# Patient Record
Sex: Female | Born: 1965 | Race: White | Hispanic: No | Marital: Married | Smoking: Never smoker
Health system: Southern US, Community
[De-identification: ages and names within clinical notes are randomized; demographics above are authoritative.]

## PROBLEM LIST (undated history)

## (undated) DIAGNOSIS — N6019 Diffuse cystic mastopathy of unspecified breast: Secondary | ICD-10-CM

## (undated) DIAGNOSIS — K5090 Crohn's disease, unspecified, without complications: Secondary | ICD-10-CM

## (undated) DIAGNOSIS — Z9889 Other specified postprocedural states: Secondary | ICD-10-CM

## (undated) DIAGNOSIS — Z1211 Encounter for screening for malignant neoplasm of colon: Secondary | ICD-10-CM

## (undated) DIAGNOSIS — L280 Lichen simplex chronicus: Secondary | ICD-10-CM

## (undated) DIAGNOSIS — L430 Hypertrophic lichen planus: Secondary | ICD-10-CM

## (undated) DIAGNOSIS — R112 Nausea with vomiting, unspecified: Secondary | ICD-10-CM

## (undated) DIAGNOSIS — D2360 Other benign neoplasm of skin of unspecified upper limb, including shoulder: Secondary | ICD-10-CM

## (undated) HISTORY — DX: Lichen simplex chronicus: L28.0

## (undated) HISTORY — DX: Other benign neoplasm of skin of unspecified upper limb, including shoulder: D23.60

## (undated) HISTORY — DX: Crohn's disease, unspecified, without complications: K50.90

## (undated) HISTORY — DX: Diffuse cystic mastopathy of unspecified breast: N60.19

## (undated) HISTORY — DX: Hypertrophic lichen planus: L43.0

## (undated) HISTORY — PX: DILATION AND CURETTAGE OF UTERUS: SHX78

## (undated) HISTORY — DX: Encounter for screening for malignant neoplasm of colon: Z12.11

## (undated) HISTORY — PX: TUBAL LIGATION: SHX77

## (undated) HISTORY — PX: OTHER SURGICAL HISTORY: SHX169

---

## 1993-09-07 DIAGNOSIS — T8859XA Other complications of anesthesia, initial encounter: Secondary | ICD-10-CM

## 1993-09-07 HISTORY — DX: Other complications of anesthesia, initial encounter: T88.59XA

## 2000-05-08 DIAGNOSIS — R7309 Other abnormal glucose: Secondary | ICD-10-CM | POA: Insufficient documentation

## 2001-12-06 DIAGNOSIS — K5090 Crohn's disease, unspecified, without complications: Secondary | ICD-10-CM | POA: Insufficient documentation

## 2001-12-06 HISTORY — PX: CHOLECYSTECTOMY: SHX55

## 2003-09-08 DIAGNOSIS — K5090 Crohn's disease, unspecified, without complications: Secondary | ICD-10-CM

## 2003-09-08 HISTORY — DX: Crohn's disease, unspecified, without complications: K50.90

## 2004-02-11 HISTORY — PX: BREAST BIOPSY: SHX20

## 2004-02-11 IMAGING — MG MM BREAST NEEDLE LOCALIZATION*L*
1 series · 8 of 8 positions shown · non-contrast
Comparison: none

REASON FOR EXAM: LT breast mass SURGERY AT 10AM  mammo done at [facility]

[L ML · left · 8 of 8 slices shown]
[im 1/8]
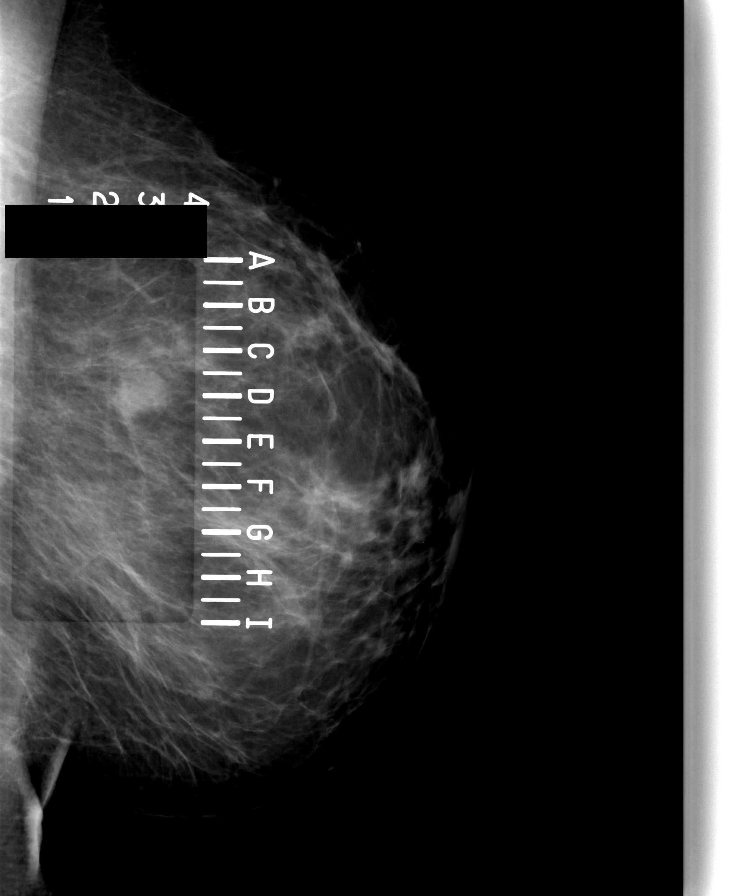
[im 2/8]
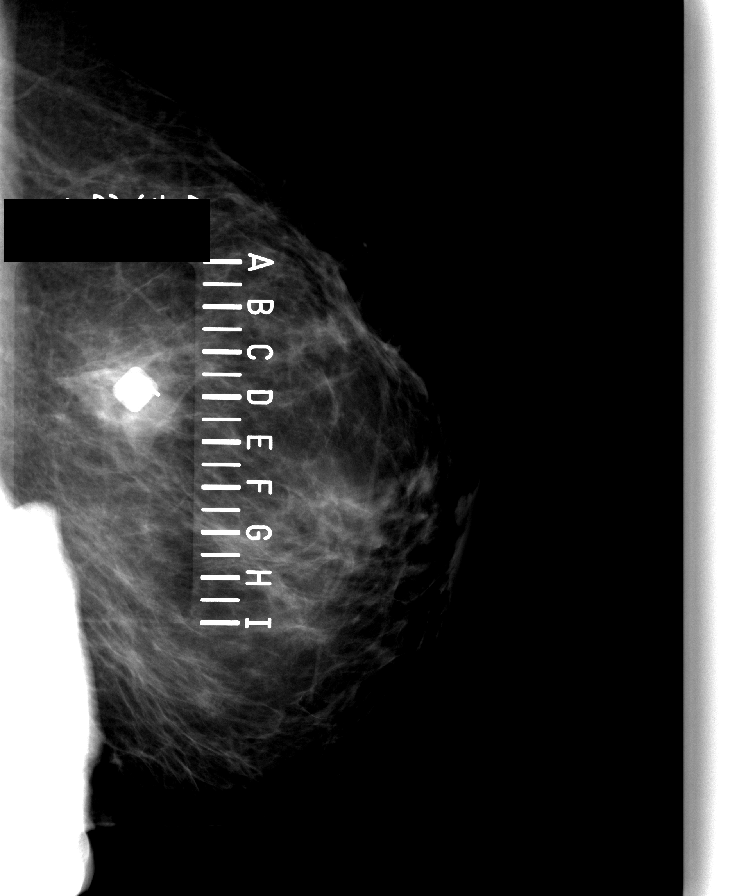
[im 3/8]
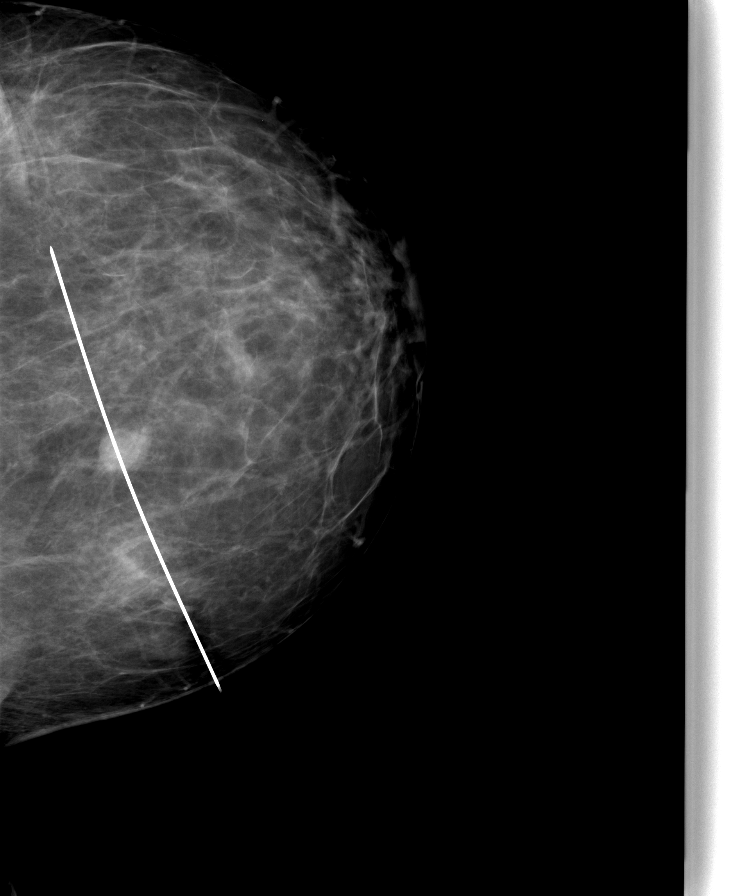
[im 4/8]
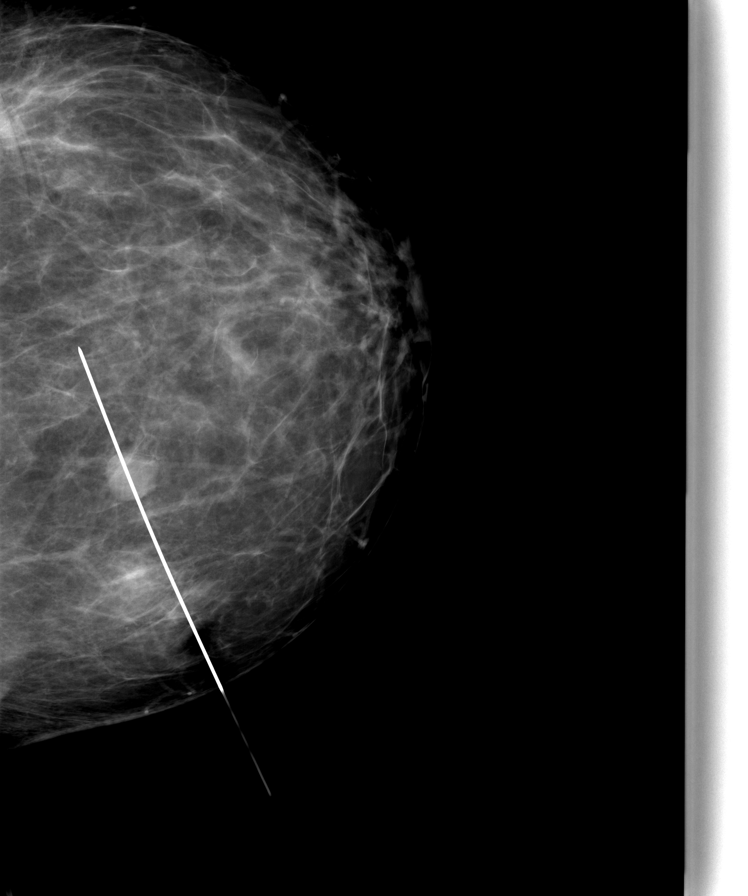
[im 5/8]
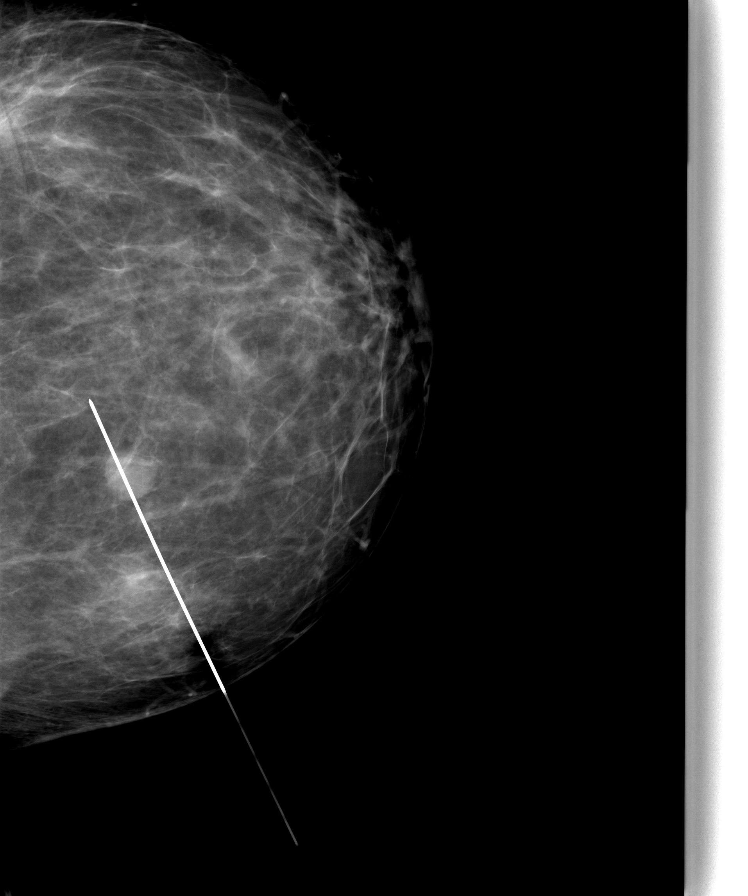
[im 6/8]
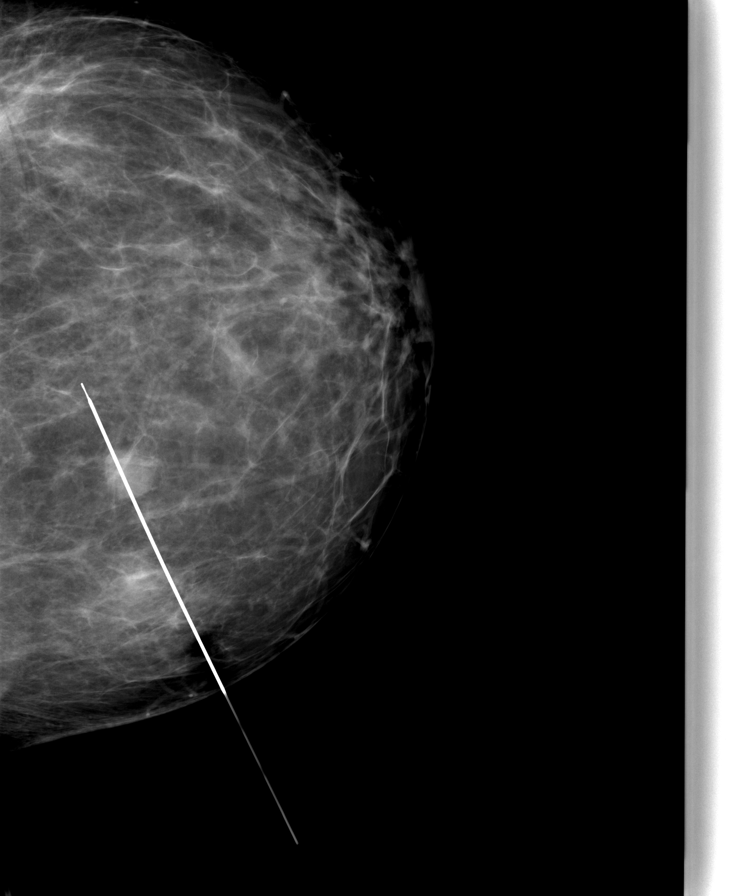
[im 7/8]
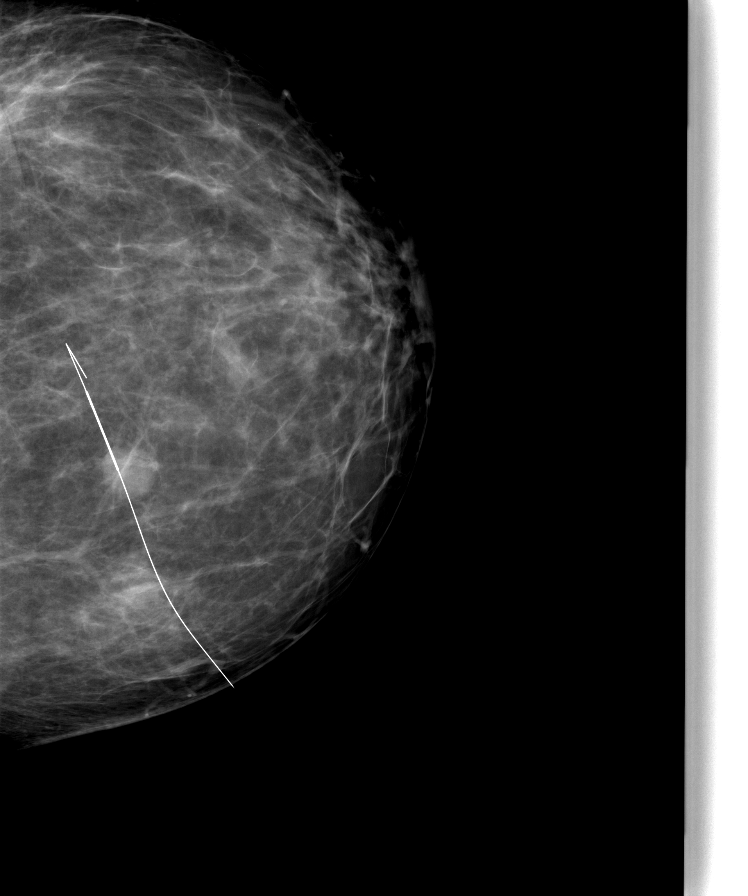
[im 8/8]
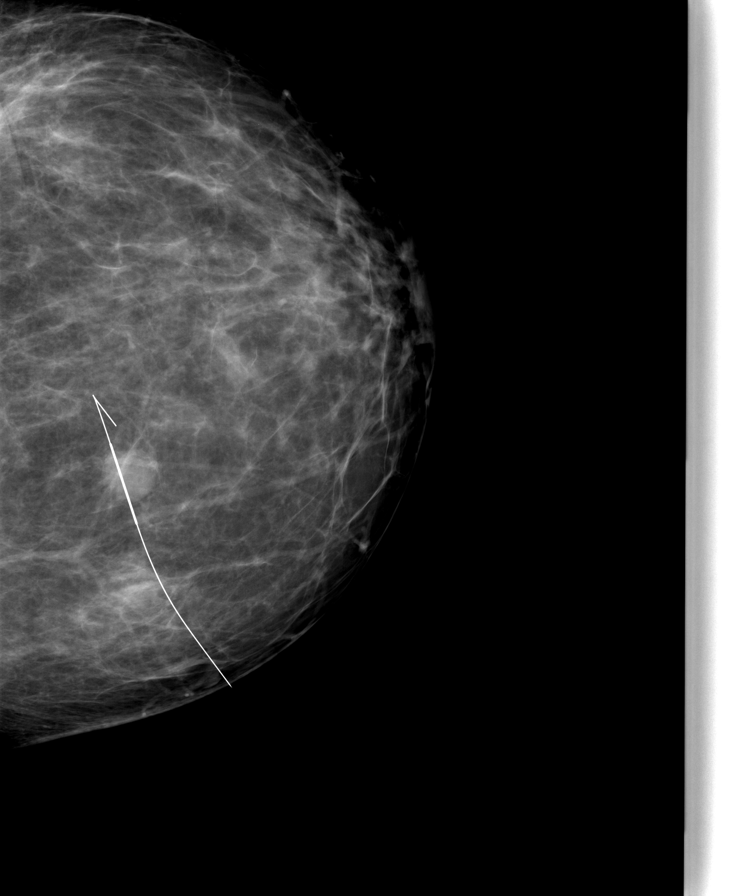

[8 of 8 positions shown; findings below may reference images not displayed]

Procedure: LEFT BREAST NEEDLE LOCALIZATION

 The patient was informed of the risks and benefits of the procedure and
proper informed consent was obtained.

 Utilizing digital mammography, localization of the nodular density within
the LEFT breast was performed. An appropriate approach from a medial to
lateral direction was established. The overlying soft tissues were then
prepped and draped in the usual sterile fashion. Utilizing approximately 3
ccs f 1% Lidocaine, the overlying soft tissues were anesthetized. A 9.0 cm,
Kopans, entry needle was used to cannulate the nodular density. Needle
placement was confirmed utilizing digital mammography. A modified Kopans
wire was placed within the entry needle traversing the nodular density.

 The wire placement was confirmed utilizing digital mammography. The patient
tolerated the procedure without complications and confirmatory digital
radiographs were obtained.
IMPRESSION: LEFT breast needle localization as described above. The patient tolerated
the procedure without complication. The patient was then taken to surgery
for lumpectomy.

## 2004-12-25 ENCOUNTER — Ambulatory Visit: Payer: Self-pay | Admitting: General Surgery

## 2004-12-25 IMAGING — MG MM CAD SCREENING MAMMO
1 series · 4 of 4 positions shown · non-contrast
Comparison: none

REASON FOR EXAM: screening
COMMENTS:

[R CC · right · 4 of 4 slices shown]
[im 1/4]
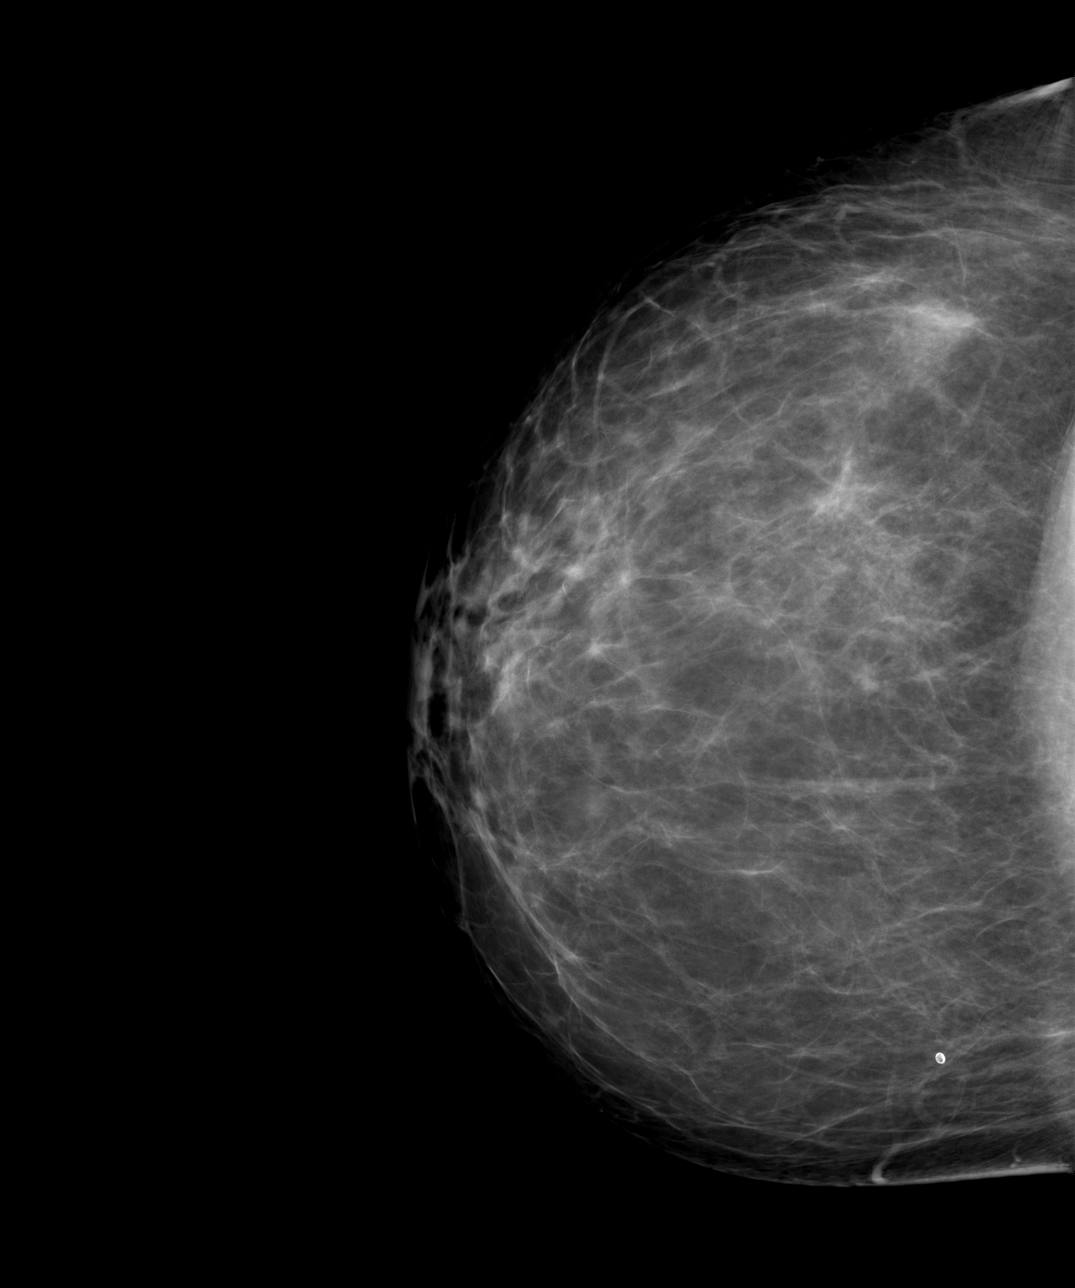
[im 2/4]
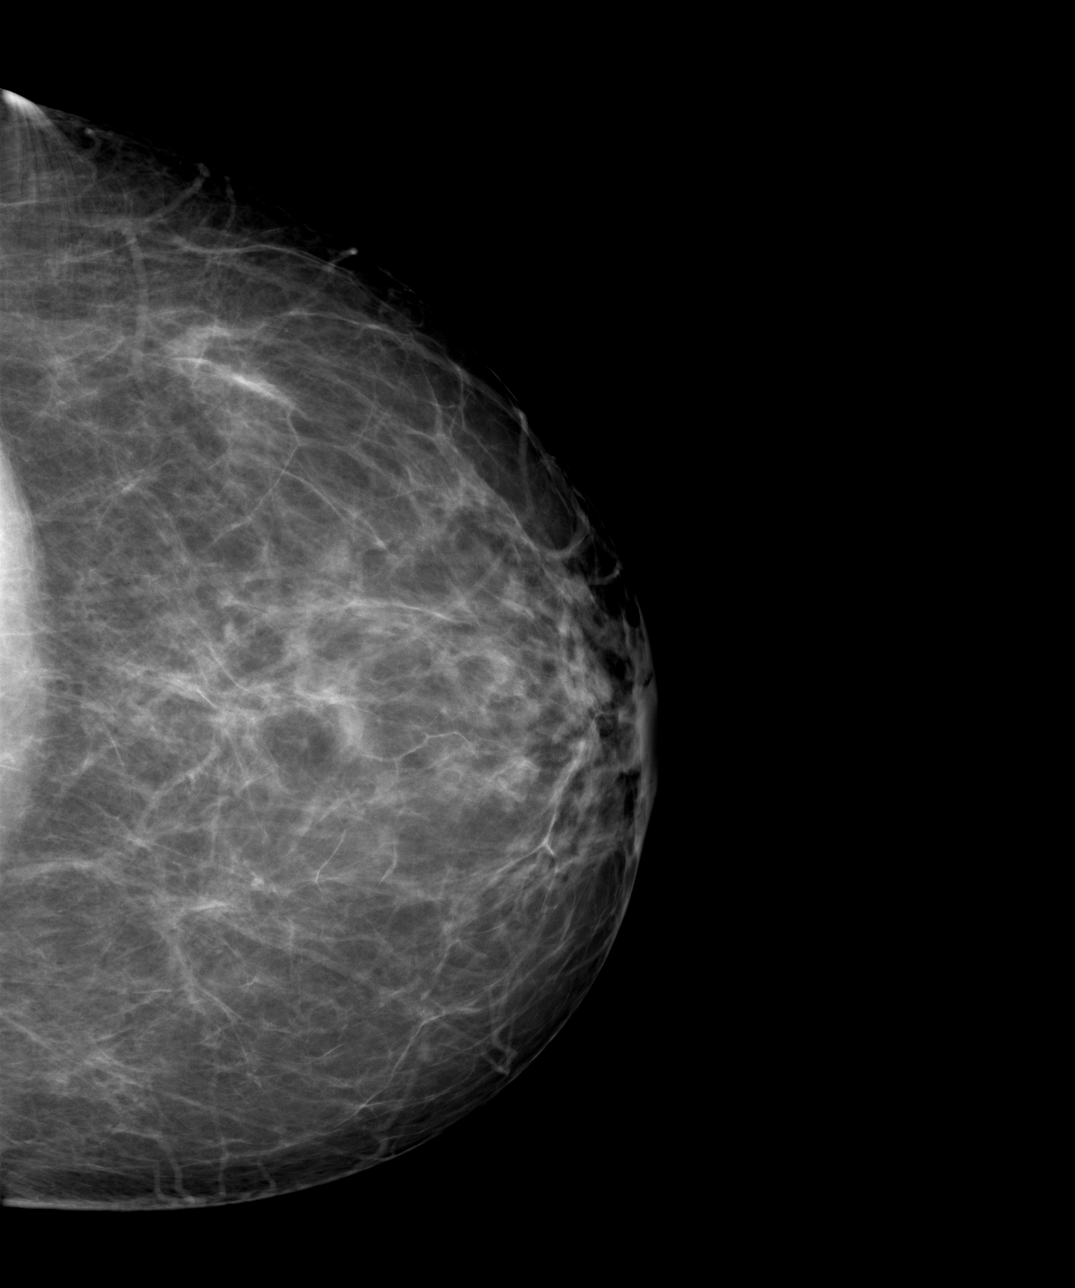
[im 3/4]
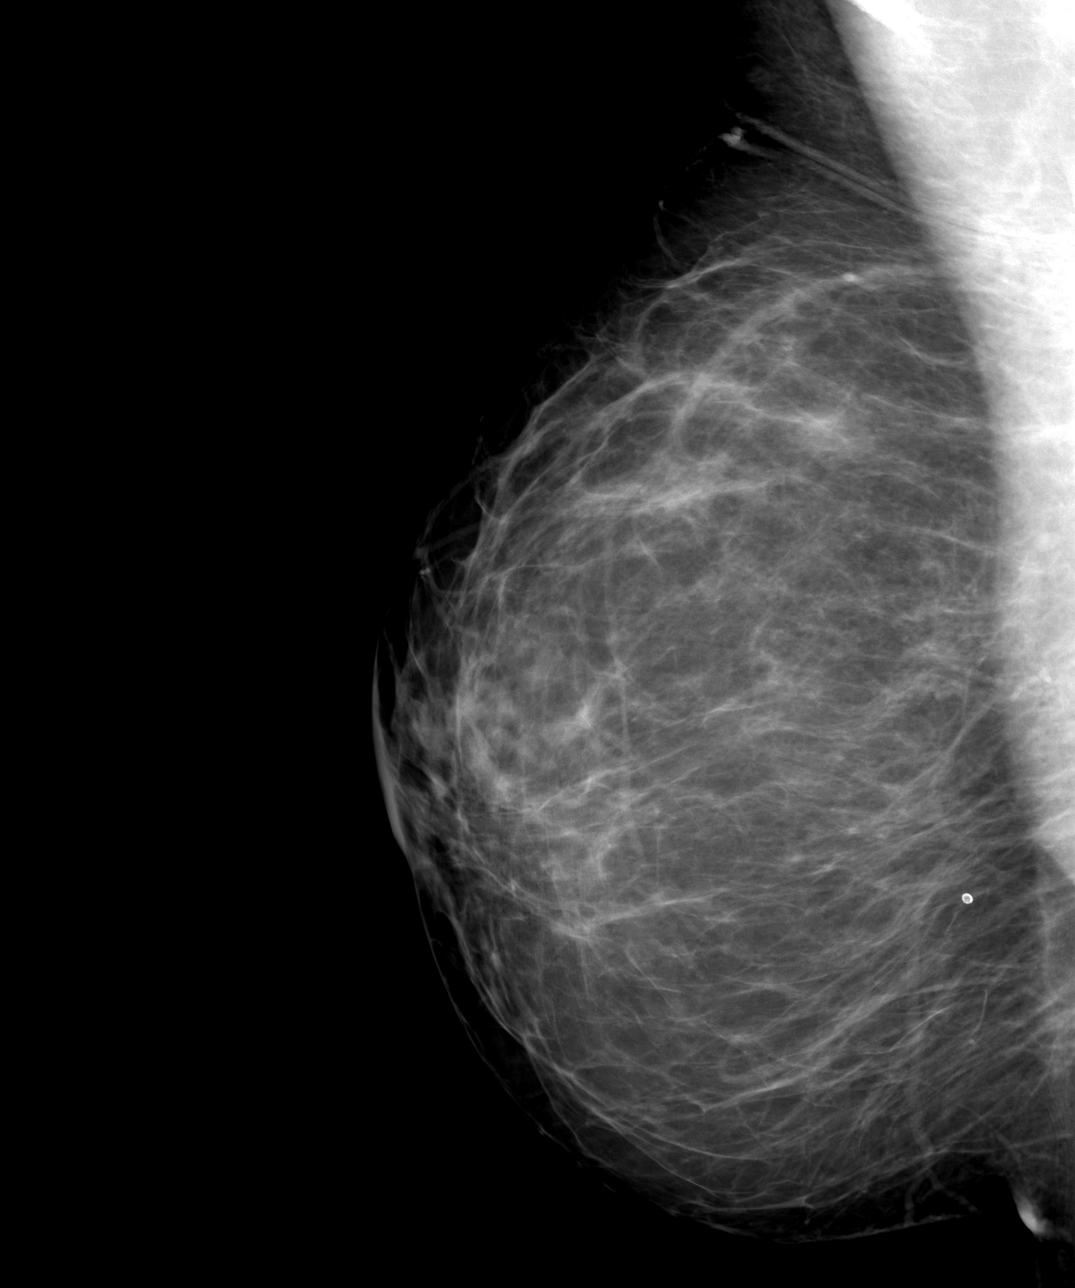
[im 4/4]
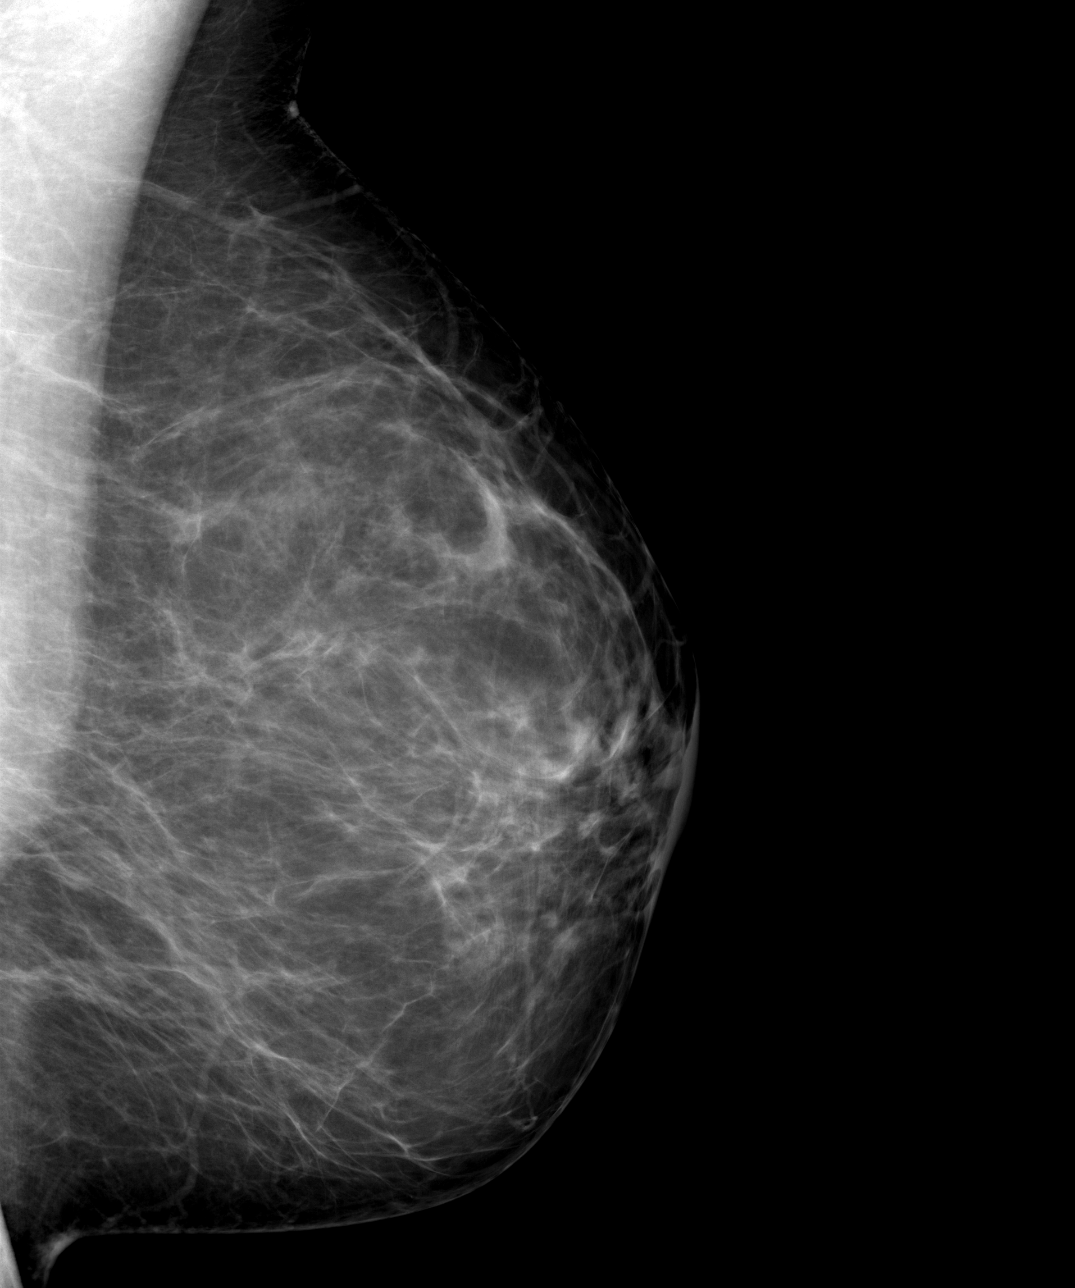

[4 of 4 positions shown; findings below may reference images not displayed]

PROCEDURE:     MAM - MAM DGTL SCREENING MAMMO W/CAD  - December 25, 2004  [DATE]

RESULT:         The breasts demonstrate a heterogenous parenchymal pattern.
 An area of asymmetric density projects within the superolateral portion of
the RIGHT breast.   This is a vague area and likely a benign finding.
Further evaluation with magnification compression imaging is recommended.
No further radiographic evidence to suggest malignancy is appreciated.
IMPRESSION: 1.     Asymmetric density RIGHT breast.  Additional imaging recommended.
2.     BI-RADS:  Category 0- Needs Additional Imaging Evaluation.

A NEGATIVE MAMMOGRAM REPORT DOES NOT PRECLUDE BIOPSY OR OTHER EVALUATION OF
A CLINICALLY PALPABLE OR OTHERWISE SUSPICIOUS MASS OR LESION.   BREAST

## 2005-01-01 ENCOUNTER — Ambulatory Visit: Payer: Self-pay | Admitting: General Surgery

## 2005-01-01 IMAGING — MG MM ADDITIONAL VIEWS AT NO CHARGE
1 series · 4 of 4 positions shown · non-contrast
Comparison: none

REASON FOR EXAM: DENSITY   IF US NEEDED TO BE DONE IN [REDACTED]
COMMENTS:

[R CC · right · 4 of 4 slices shown]
[im 1/4]
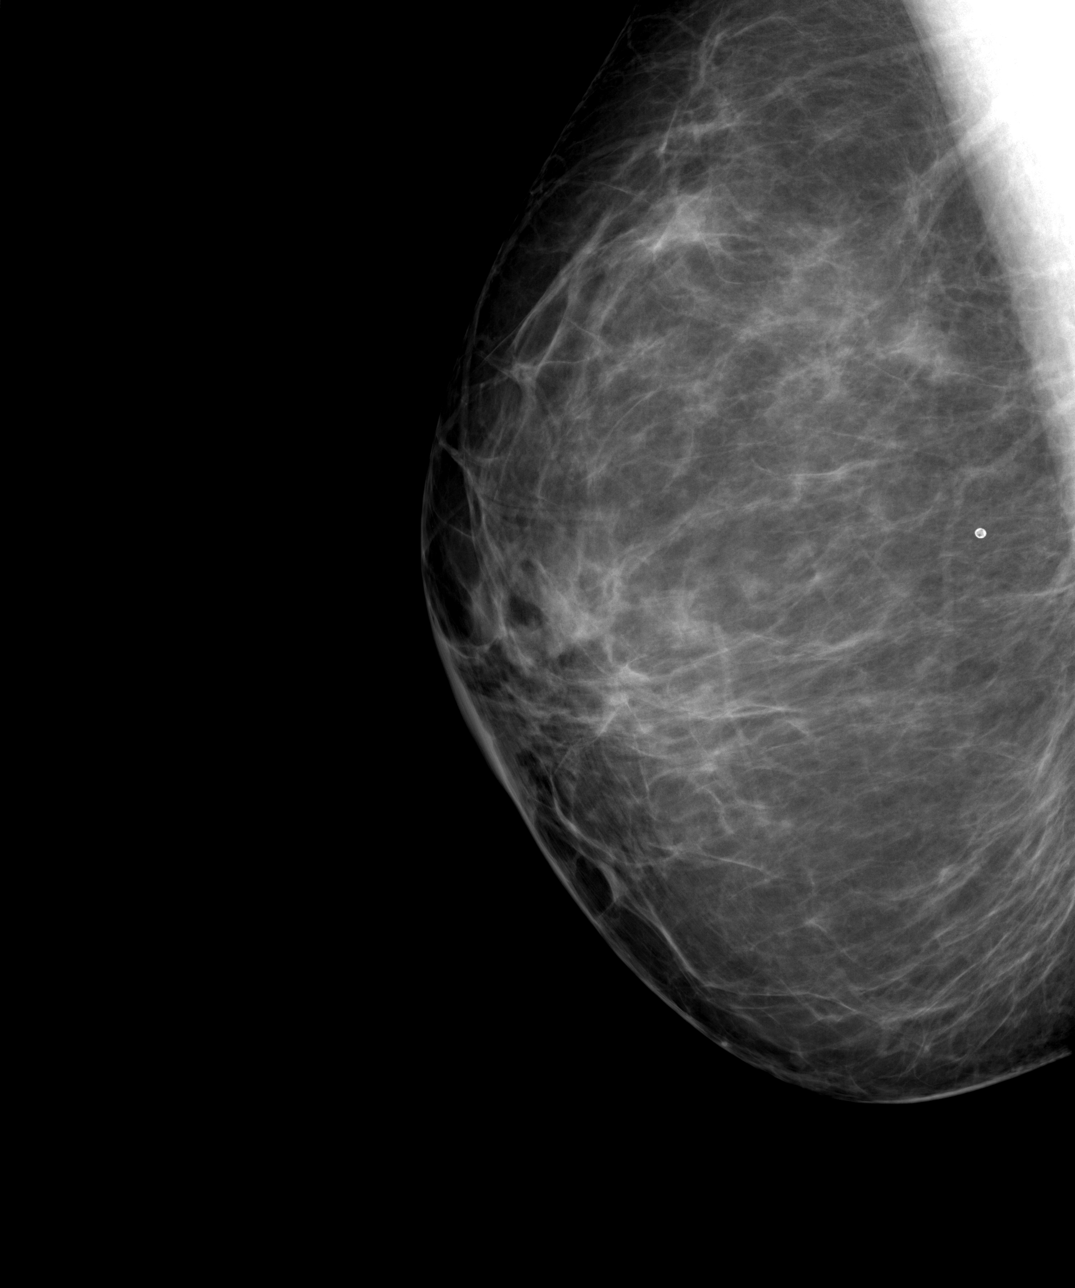
[im 2/4]
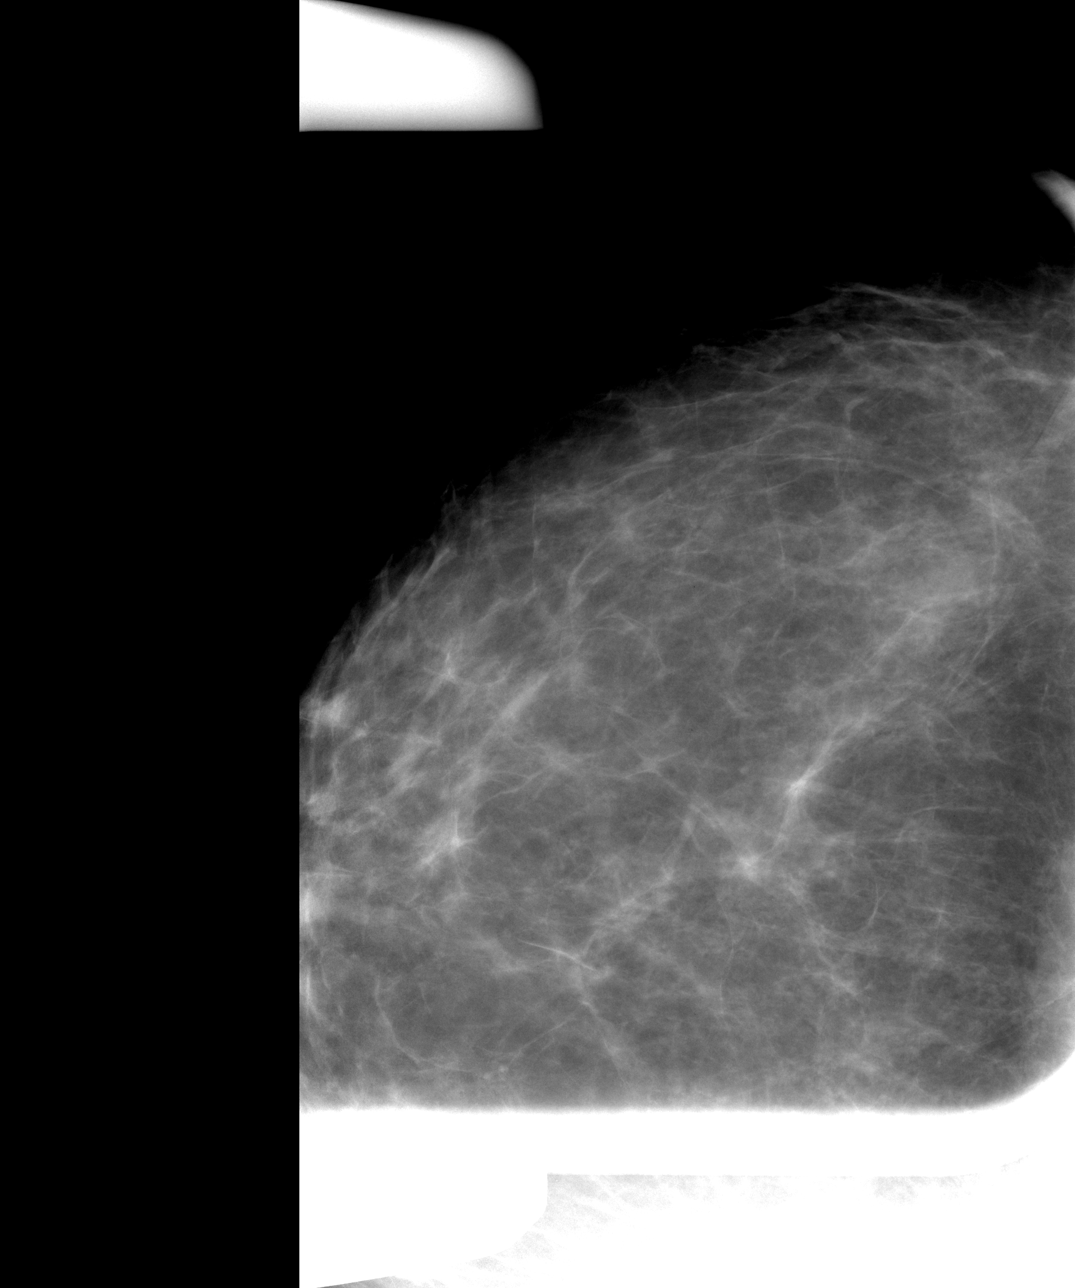
[im 3/4]
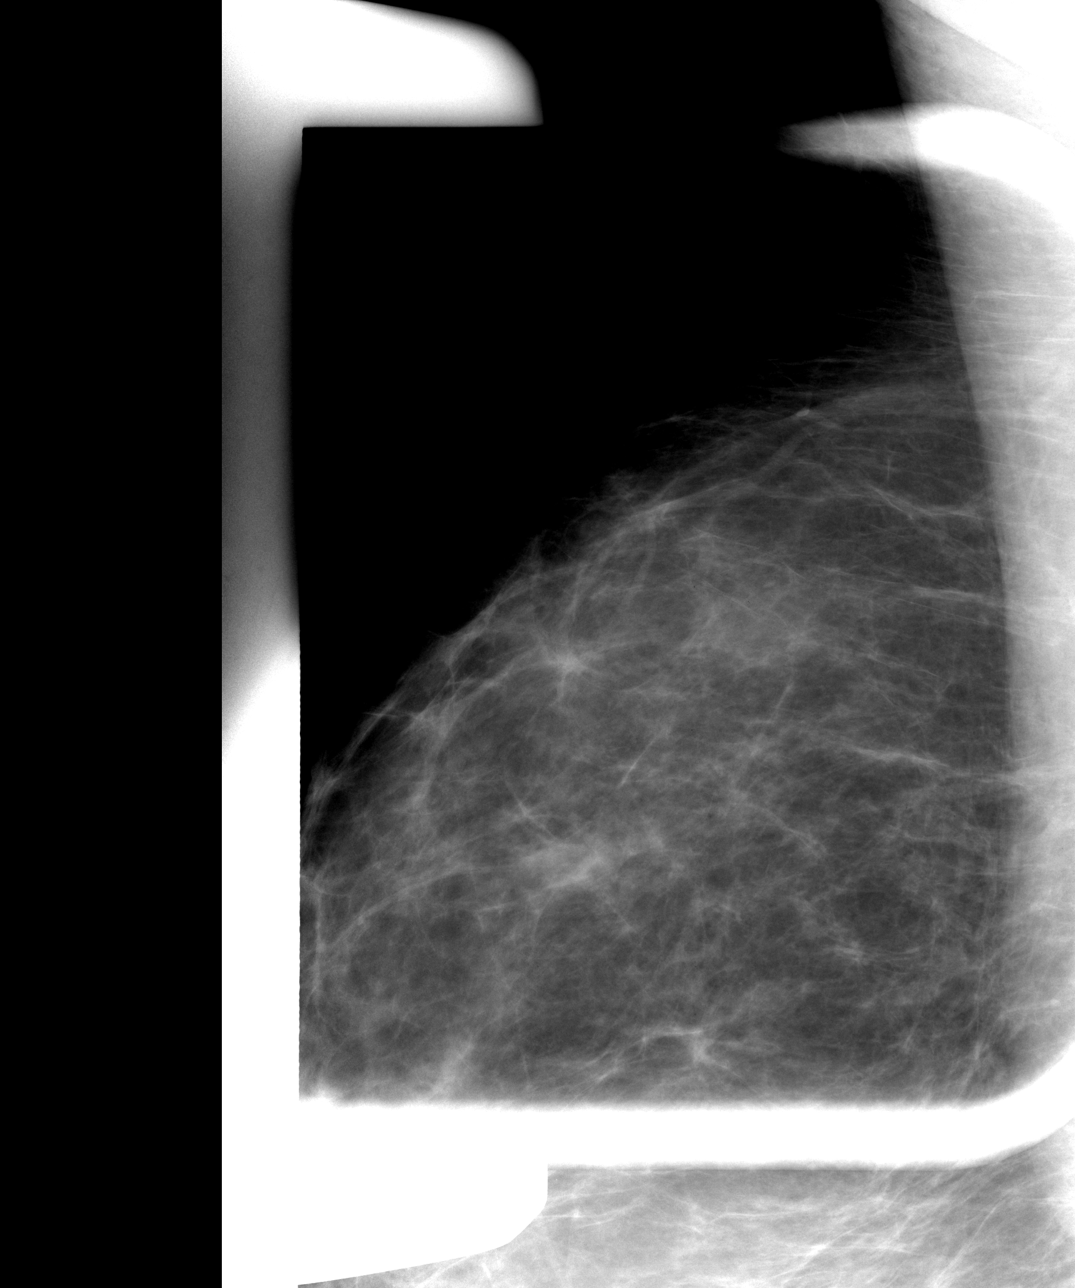
[im 4/4]
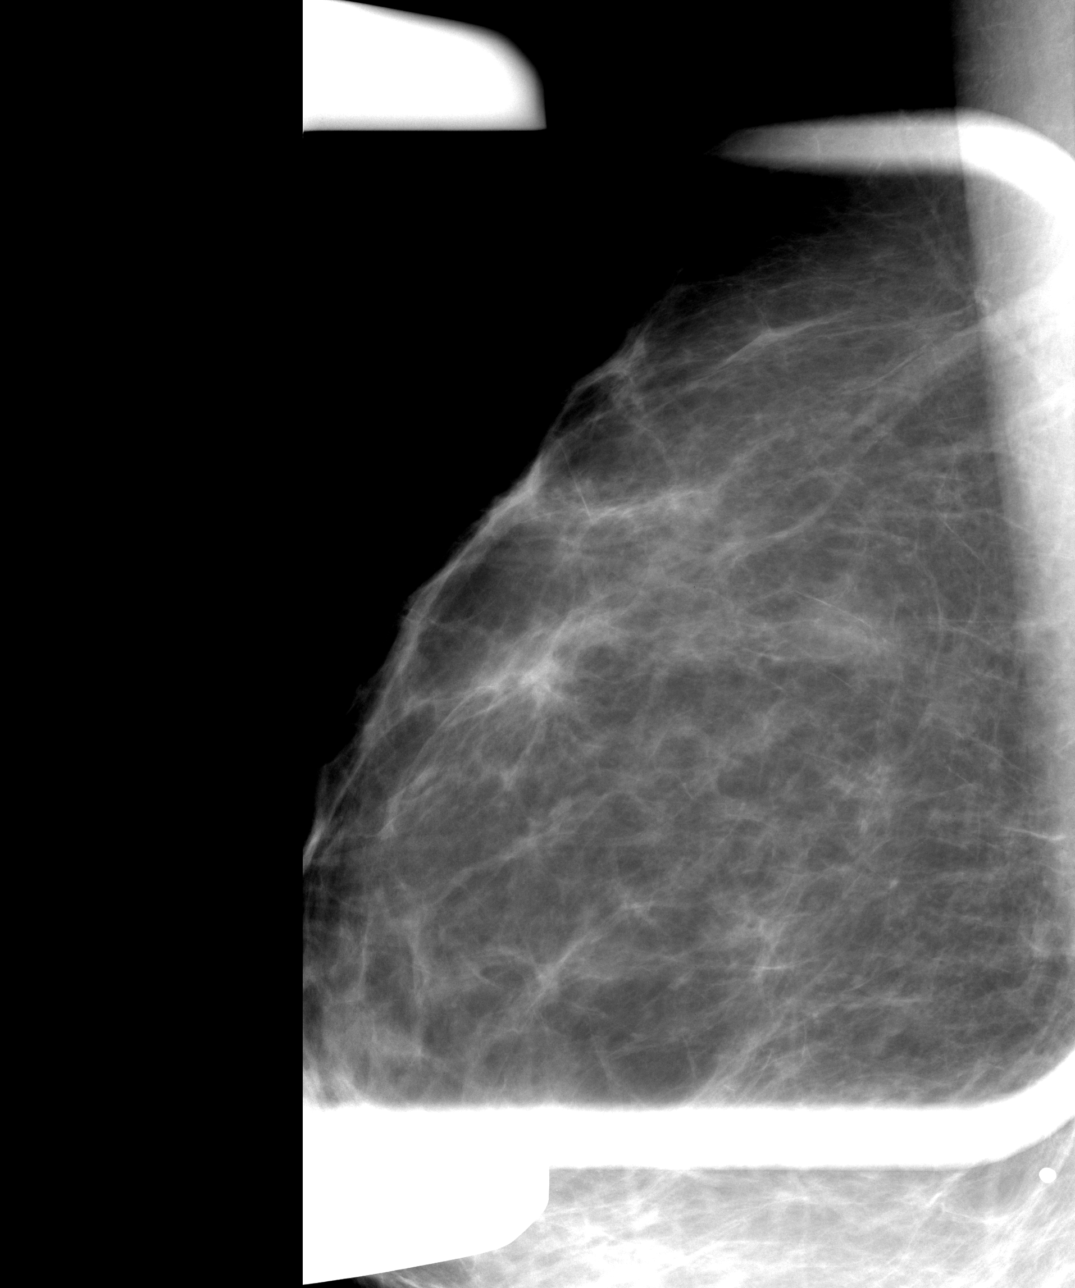

[4 of 4 positions shown; findings below may reference images not displayed]

PROCEDURE:     MAM - MAM DIG ADDVIEWS RT SCR  - January 01, 2005 [DATE]

RESULT:     Evaluation of the RIGHT breast with magnification compression
imaging demonstrates no radiographic abnormalities. The region of interest
spreads with magnification compression imaging consistent with a benign
finding.
IMPRESSION: 1)Benign findings, RIGHT breast.

2)BI-RADS: Category 2-Benign Finding

A NEGATIVE MAMMOGRAM REPORT DOES NOT PRECLUDE BIOPSY OR OTHER EVALUATION OF
A CLINICALLY PALPABLE OR OTHERWISE SUSPICIOUS MASS OR LESION.  BREAST CANCER
MAY NOT BE DETECTED BY MAMMOGRAPHY IN UP TO 10% OF CASES.

## 2005-02-18 ENCOUNTER — Ambulatory Visit: Payer: Self-pay | Admitting: Family Medicine

## 2005-02-23 ENCOUNTER — Other Ambulatory Visit: Admission: RE | Admit: 2005-02-23 | Discharge: 2005-02-23 | Payer: Self-pay | Admitting: Internal Medicine

## 2005-02-23 ENCOUNTER — Ambulatory Visit: Payer: Self-pay | Admitting: Family Medicine

## 2005-07-01 ENCOUNTER — Ambulatory Visit: Payer: Self-pay | Admitting: Family Medicine

## 2005-07-29 ENCOUNTER — Ambulatory Visit: Payer: Self-pay | Admitting: Family Medicine

## 2006-03-07 ENCOUNTER — Encounter: Payer: Self-pay | Admitting: Family Medicine

## 2006-03-07 LAB — CONVERTED CEMR LAB: Pap Smear: NORMAL

## 2006-03-08 ENCOUNTER — Other Ambulatory Visit: Admission: RE | Admit: 2006-03-08 | Discharge: 2006-03-08 | Payer: Self-pay | Admitting: Internal Medicine

## 2006-03-08 ENCOUNTER — Ambulatory Visit: Payer: Self-pay | Admitting: Family Medicine

## 2006-11-10 ENCOUNTER — Ambulatory Visit: Payer: Self-pay | Admitting: Family Medicine

## 2006-11-16 ENCOUNTER — Ambulatory Visit: Payer: Self-pay | Admitting: Family Medicine

## 2006-11-23 ENCOUNTER — Ambulatory Visit: Payer: Self-pay | Admitting: Family Medicine

## 2007-03-15 ENCOUNTER — Ambulatory Visit: Payer: Self-pay | Admitting: General Surgery

## 2007-03-15 IMAGING — MG MM CAD SCREENING MAMMO
1 series · 4 of 4 positions shown · non-contrast
Comparison: none

REASON FOR EXAM: screening mammo
COMMENTS:

PROCEDURE:     MAM - MAM DGTL SCREENING MAMMO W/CAD  - March 15, 2007  [DATE]
RESULT:
COMPARISONS: Exams in 6115 and 1444.

[Series 5128: R CC · right · 4 of 4 slices shown]
[im 1/4]
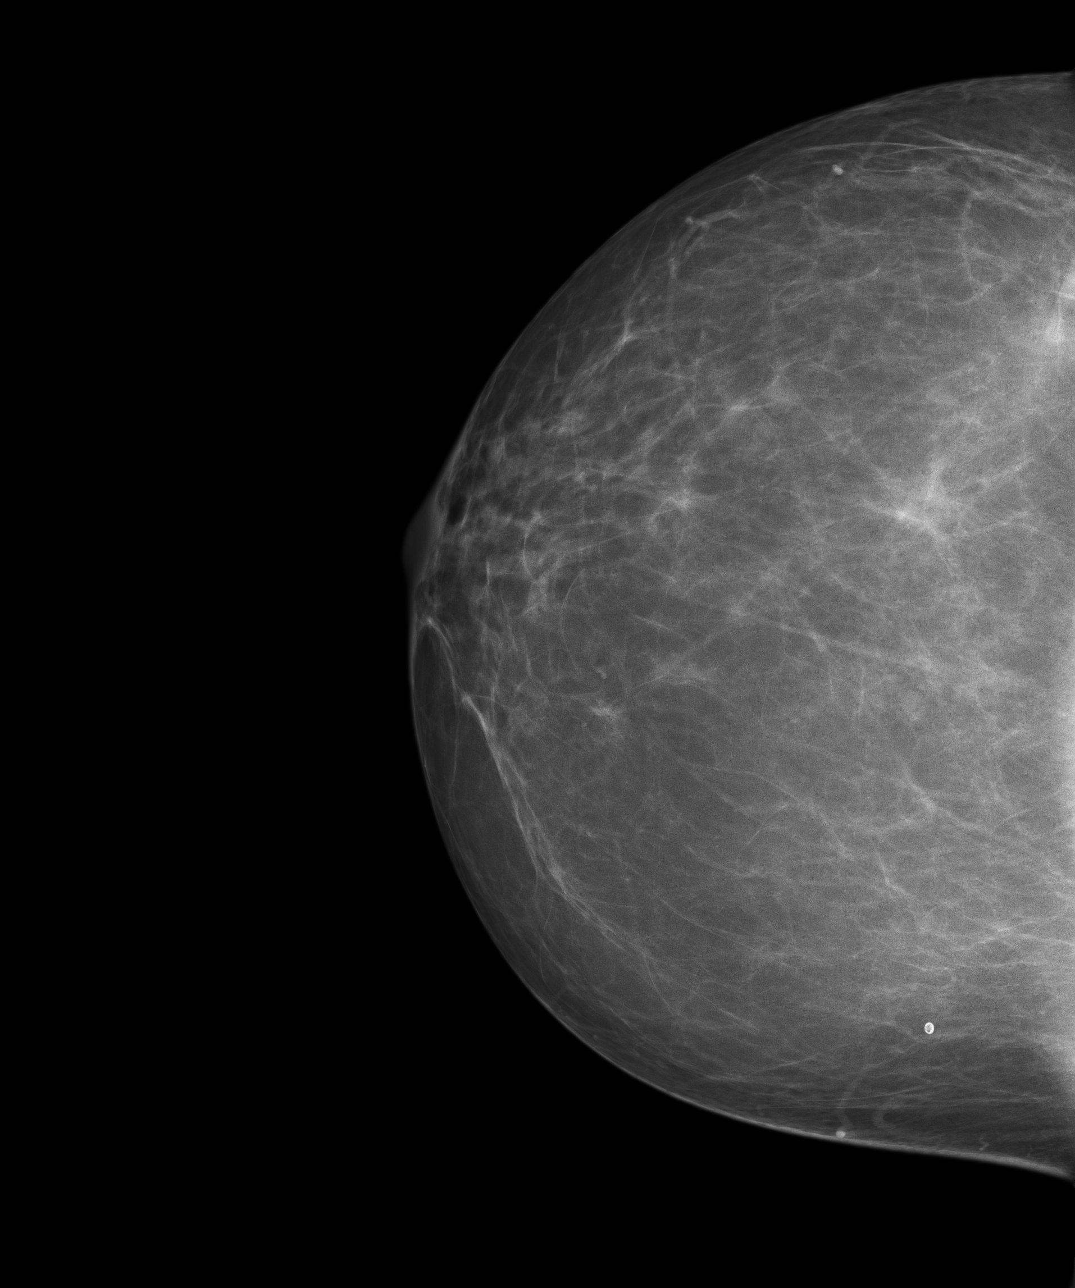
[im 2/4]
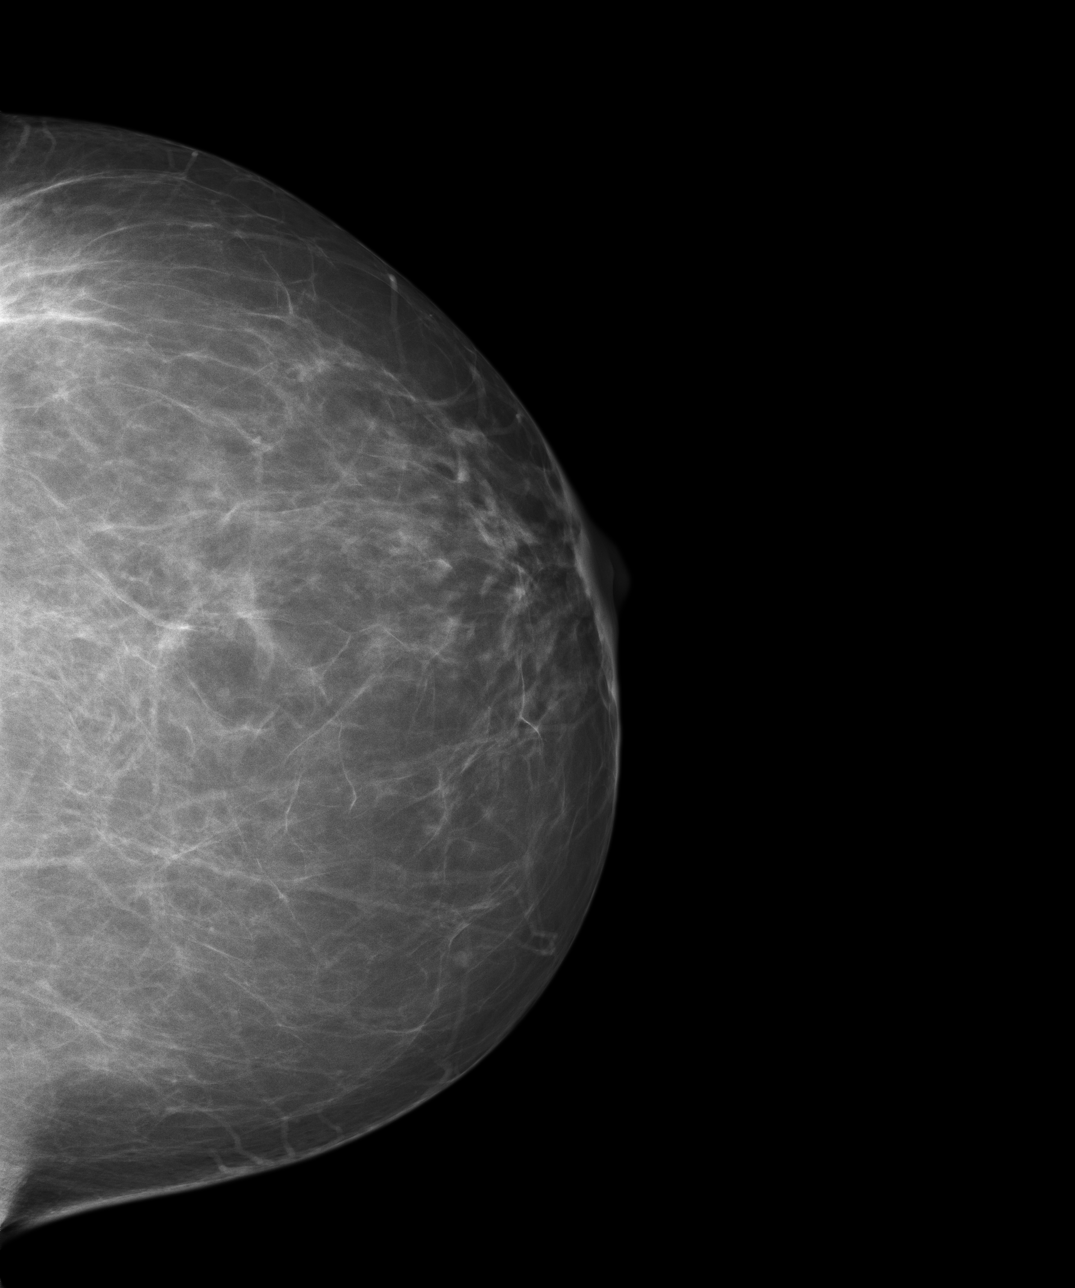
[im 3/4]
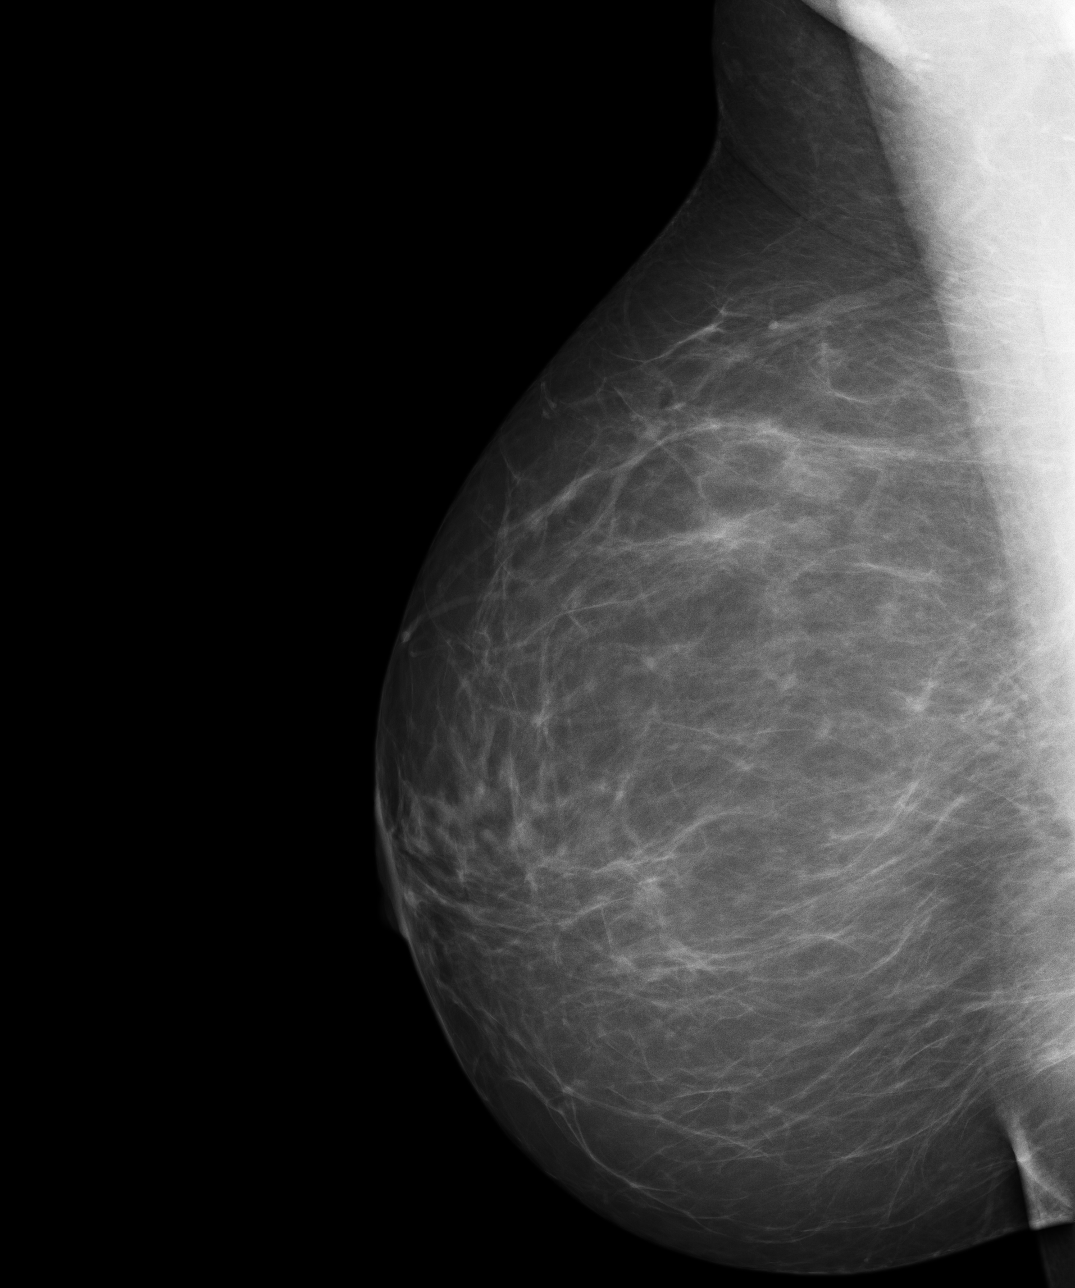
[im 4/4]
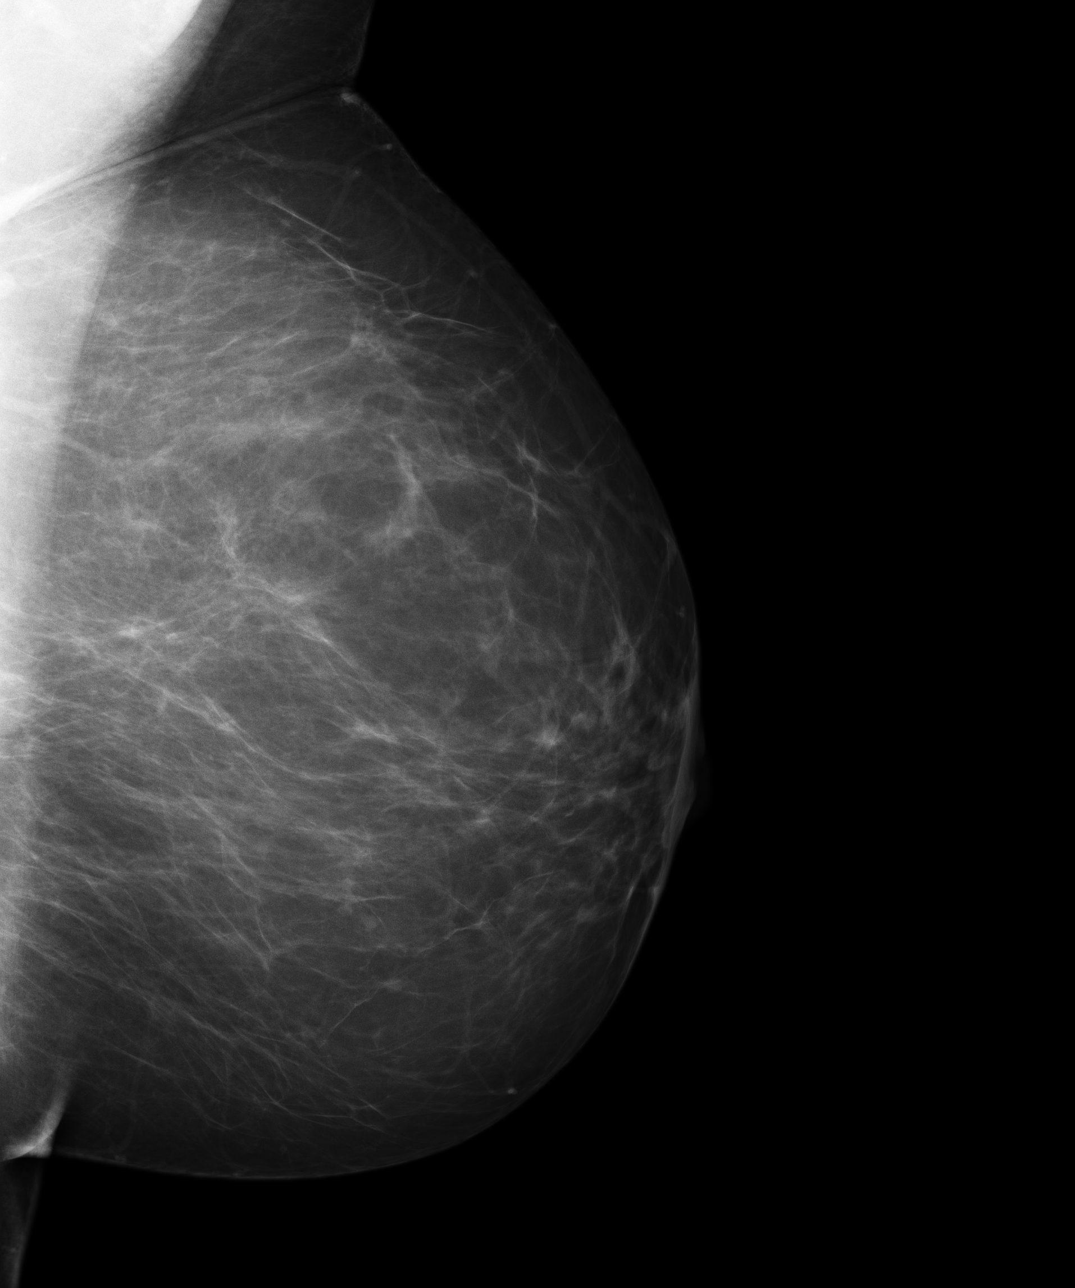

[4 of 4 positions shown; findings below may reference images not displayed]

FINDINGS: Scattered fibroglandular densities are seen involving both
breasts. No new suspicious mass or calcification.
IMPRESSION: 1)Negative bilateral mammogram.

2)Recommend routine follow-up mammogram in one year.

3)BI-RADS: Category 1-Negative

A NEGATIVE MAMMOGRAM REPORT DOES NOT PRECLUDE BIOPSY OR OTHER EVALUATION OF
A CLINICALLY PALPABLE OR OTHERWISE SUSPICIOUS MASS OR LESION. BREAST CANCER
MAY NOT BE DETECTED BY MAMMOGRAPHY IN UP TO 10% OF CASES.

## 2007-09-08 HISTORY — PX: LIPOMA EXCISION: SHX5283

## 2007-12-07 ENCOUNTER — Ambulatory Visit: Payer: Self-pay | Admitting: Family Medicine

## 2007-12-12 ENCOUNTER — Telehealth (INDEPENDENT_AMBULATORY_CARE_PROVIDER_SITE_OTHER): Payer: Self-pay | Admitting: Internal Medicine

## 2007-12-14 ENCOUNTER — Encounter: Payer: Self-pay | Admitting: Family Medicine

## 2007-12-14 DIAGNOSIS — D539 Nutritional anemia, unspecified: Secondary | ICD-10-CM

## 2007-12-14 HISTORY — DX: Nutritional anemia, unspecified: D53.9

## 2008-01-23 ENCOUNTER — Encounter (INDEPENDENT_AMBULATORY_CARE_PROVIDER_SITE_OTHER): Payer: Self-pay | Admitting: Internal Medicine

## 2008-01-23 ENCOUNTER — Other Ambulatory Visit: Admission: RE | Admit: 2008-01-23 | Discharge: 2008-01-23 | Payer: Self-pay | Admitting: Family Medicine

## 2008-01-23 ENCOUNTER — Ambulatory Visit: Payer: Self-pay | Admitting: Family Medicine

## 2008-01-31 ENCOUNTER — Encounter (INDEPENDENT_AMBULATORY_CARE_PROVIDER_SITE_OTHER): Payer: Self-pay | Admitting: Internal Medicine

## 2008-01-31 ENCOUNTER — Encounter (INDEPENDENT_AMBULATORY_CARE_PROVIDER_SITE_OTHER): Payer: Self-pay | Admitting: *Deleted

## 2008-02-03 LAB — CONVERTED CEMR LAB
BUN: 7 mg/dL (ref 6–23)
Chloride: 109 meq/L (ref 96–112)
Eosinophils Absolute: 0.1 10*3/uL (ref 0.0–0.7)
Eosinophils Relative: 2 % (ref 0.0–5.0)
GFR calc non Af Amer: 84 mL/min
Glucose, Bld: 132 mg/dL — ABNORMAL HIGH (ref 70–99)
Monocytes Relative: 7.7 % (ref 3.0–12.0)
Neutrophils Relative %: 76.4 % (ref 43.0–77.0)
Platelets: 359 10*3/uL (ref 150–400)
Potassium: 4.4 meq/L (ref 3.5–5.1)
WBC: 7.1 10*3/uL (ref 4.5–10.5)

## 2008-02-08 ENCOUNTER — Ambulatory Visit: Payer: Self-pay | Admitting: Internal Medicine

## 2008-02-13 LAB — CONVERTED CEMR LAB: Hgb A1c MFr Bld: 5.3 % (ref 4.6–6.0)

## 2008-03-15 ENCOUNTER — Ambulatory Visit: Payer: Self-pay | Admitting: General Surgery

## 2008-04-03 ENCOUNTER — Encounter (INDEPENDENT_AMBULATORY_CARE_PROVIDER_SITE_OTHER): Payer: Self-pay | Admitting: Internal Medicine

## 2008-04-19 ENCOUNTER — Ambulatory Visit: Payer: Self-pay | Admitting: General Surgery

## 2008-10-07 ENCOUNTER — Emergency Department: Payer: Self-pay | Admitting: Emergency Medicine

## 2008-10-09 ENCOUNTER — Encounter (INDEPENDENT_AMBULATORY_CARE_PROVIDER_SITE_OTHER): Payer: Self-pay | Admitting: Internal Medicine

## 2009-04-28 ENCOUNTER — Inpatient Hospital Stay: Payer: Self-pay | Admitting: Surgery

## 2009-04-28 IMAGING — CT CT ABD-PELV W/O CM
1 of 2 series · 15 of 32 positions shown, 19 images · non-contrast
Comparison: none

REASON FOR EXAM: (1) CRAMPY DIFFUSE PAIN; (2) H/O CROHN'S;
VOMITING/DIARRHEA
COMMENTS:

[Series 2: abdomen · axial · 0.65mm/px · z∈[+320,+740]mm · 15 of 92 slices shown, 19 images]
[im 4/92  soft-tissue]
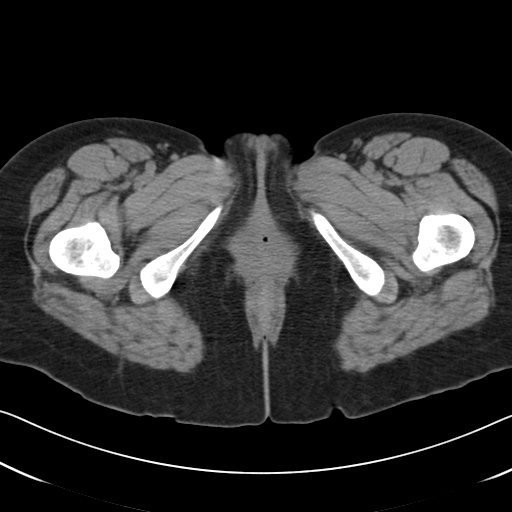
[im 4/92  bone]
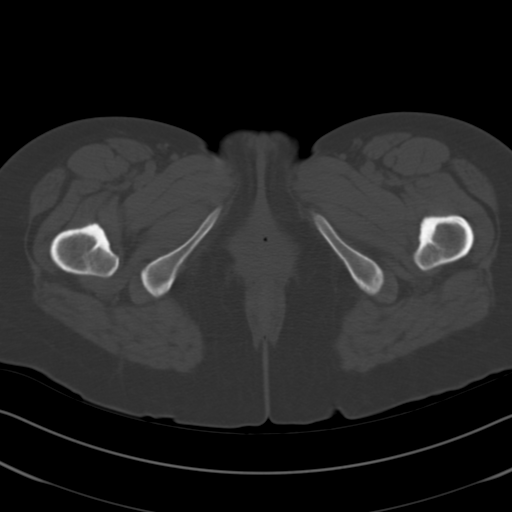
[im 12/92  soft-tissue]
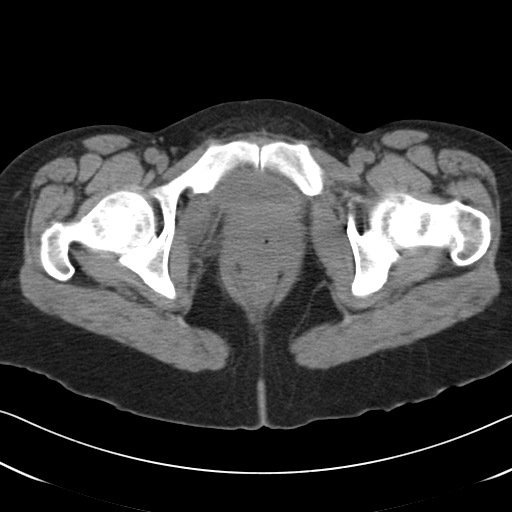
[im 19/92  soft-tissue]
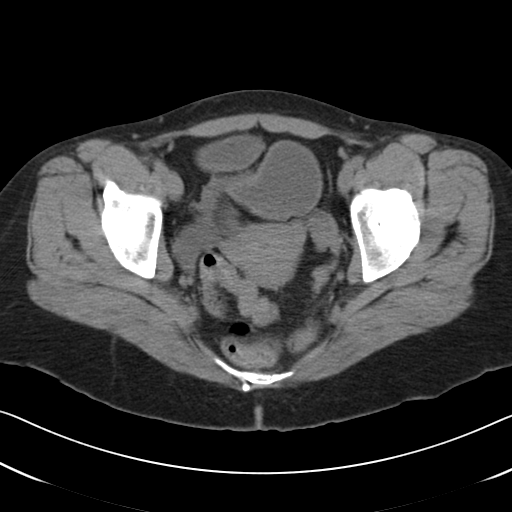
[im 27/92  soft-tissue]
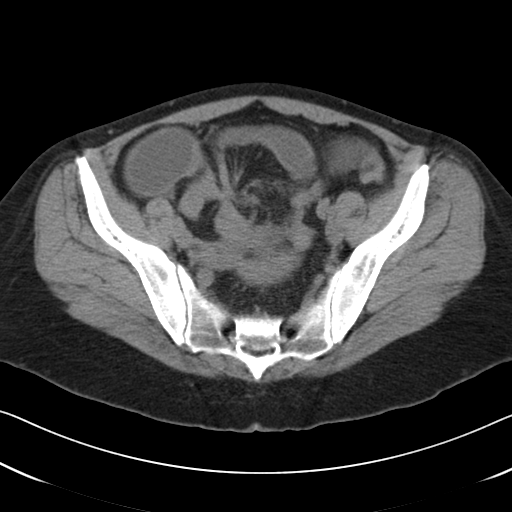
[im 31/92  soft-tissue]
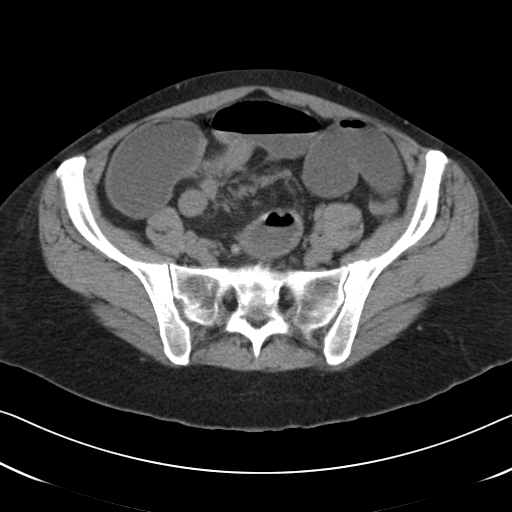
[im 38/92  soft-tissue]
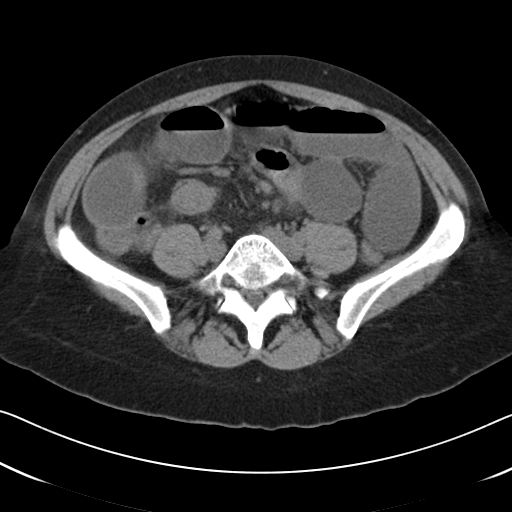
[im 46/92  soft-tissue]
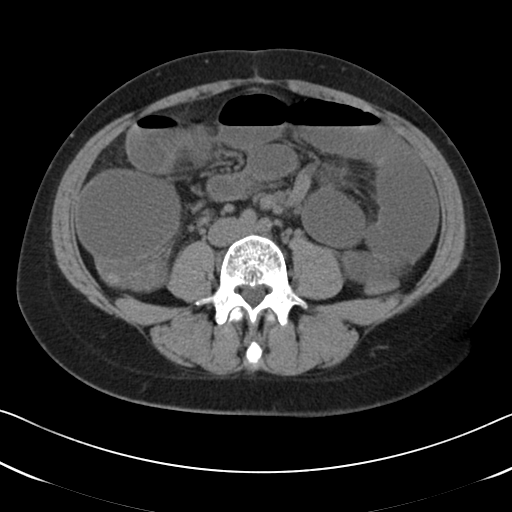
[im 54/92  soft-tissue]
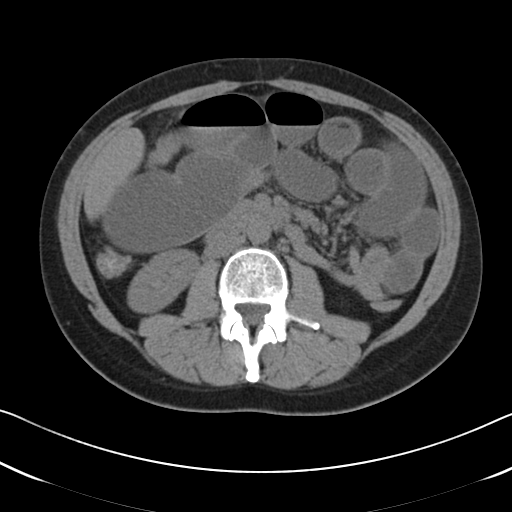
[im 61/92  soft-tissue]
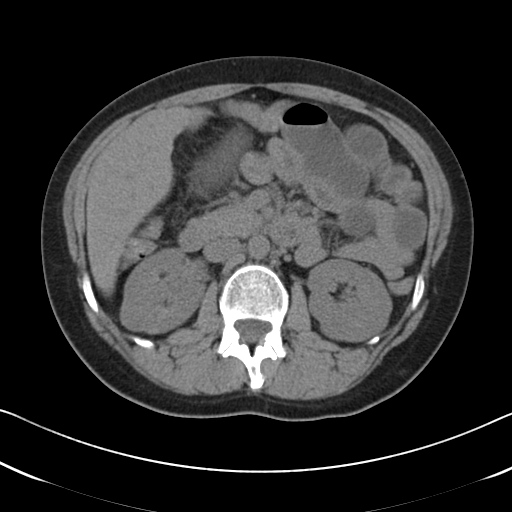
[im 61/92  bone]
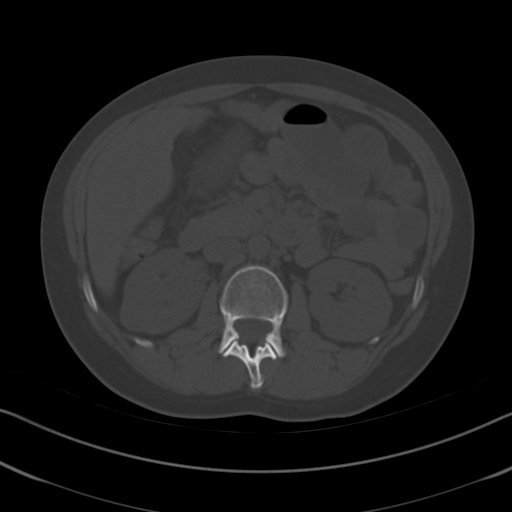
[im 65/92  soft-tissue]
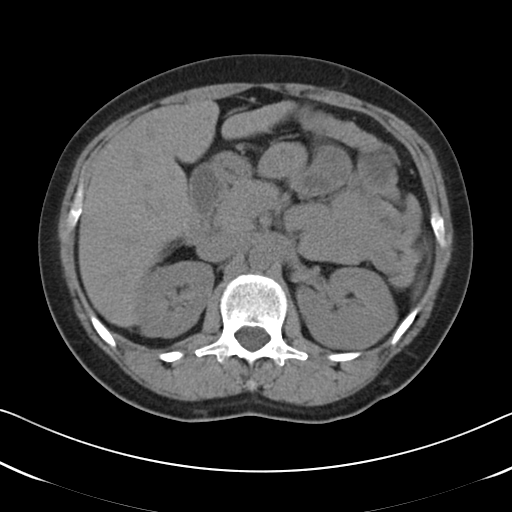
[im 73/92  soft-tissue]
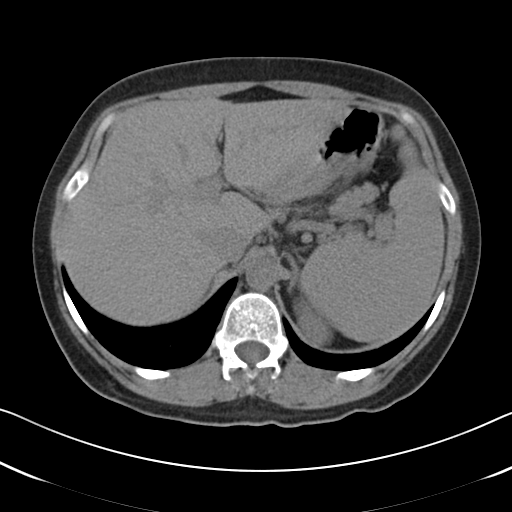
[im 76/92  lung]
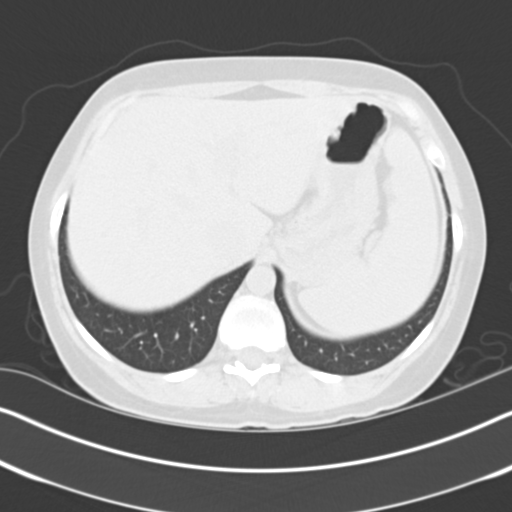
[im 80/92  soft-tissue]
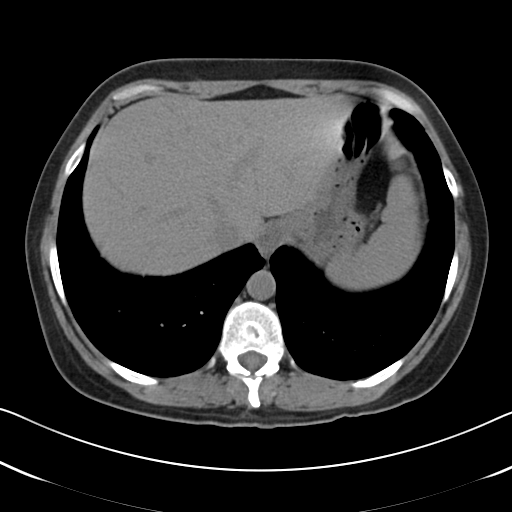
[im 80/92  lung]
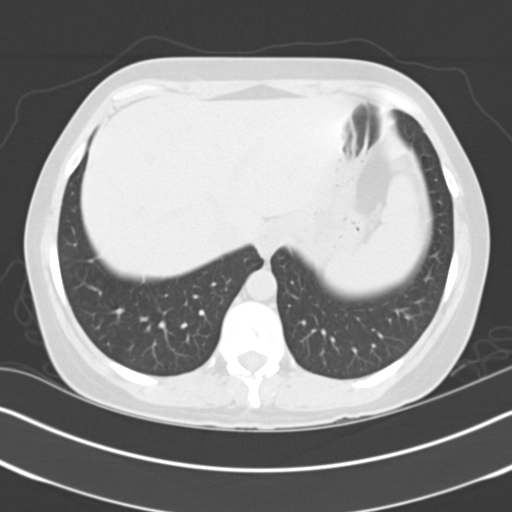
[im 84/92  lung]
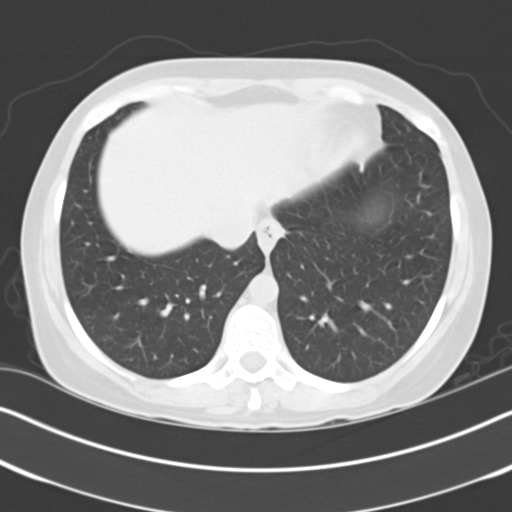
[im 88/92  soft-tissue]
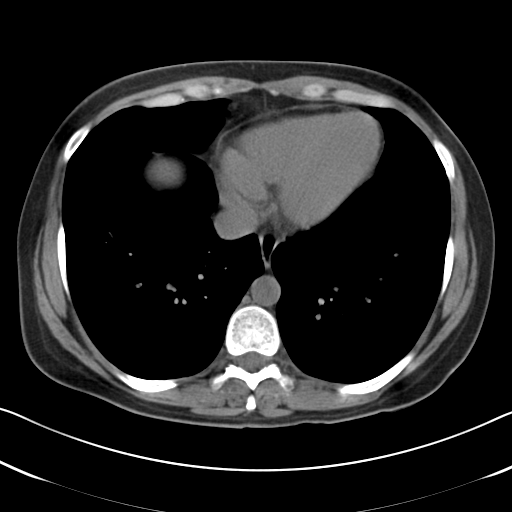
[im 88/92  lung]
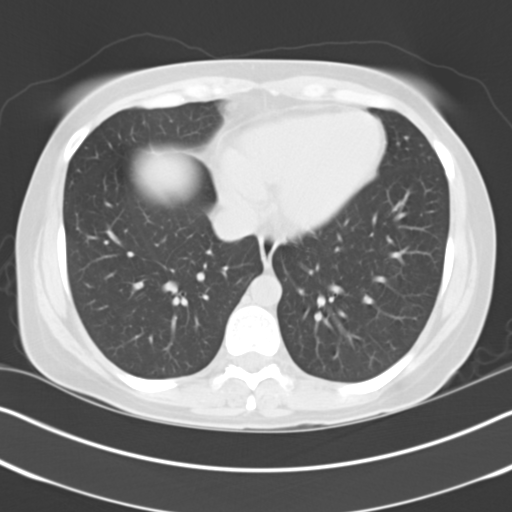

[15 of 32 positions shown; findings below may reference images not displayed]

PROCEDURE:     CT  - CT ABDOMEN AND PELVIS W[DATE]  [DATE]

RESULT:     Axial noncontrast CT scanning was performed through the abdomen
and pelvis at 3 mm intervals correction 5 mm intervals and slice
thicknesses. Review of 3-dimensional reconstructed images was performed
separately on the [employer] monitor.

The small bowel loops are moderately dilated. The large bowel is collapsed.
There is thickening of the wall of the terminal ileum which is demonstrated
best on images 50 through
69. The appendix is demonstrated and it appears normal.

Within the pelvis the uterus is normal in appearance. There is fullness in
the adnexal regions most compatible with physiologic ovarian processes.
There is no free fluid in the pelvis. The urinary bladder is partially
distended and grossly normal.

The liver, spleen, pancreas, adrenal glands, partially distended stomach,
and kidneys exhibit no acute abnormality. The caliber of the abdominal aorta
is normal.

The lung bases are clear. The lumbar vertebral bodies are preserved in
height.
IMPRESSION: The findings are consistent with inflammatory change in the
distal ileum compatible with the known Crohn's disease. This is resulting in
a partial small bowel obstruction. I do not see free extraluminal fluid
collections. The appendix is demonstrated and appears normal. The
possibility of adhesions resulting in the partial small bowel obstruction
was raised by the preliminary reviewer, and while this is possible, the fact
that the terminal ileum appears thickened with increased density in the
surrounding fat suggests a flare of the patient's Crohn's disease.
2. There is mild fullness in the adnexal regions bilaterally which is likely
physiologic but which could be evaluated further with pelvic ultrasound.

A preliminary report was sent to the [HOSPITAL] the conclusion
of the study.

## 2009-04-29 IMAGING — CR DG ABDOMEN 2V
1 series · 2 of 2 positions shown · non-contrast
Comparison: none

REASON FOR EXAM: intestinal obstruction, hx Crohn's disease
COMMENTS:

[Series 1: view not recorded · 0.17mm/px · 2 of 2 slices shown]
[im 1/2]
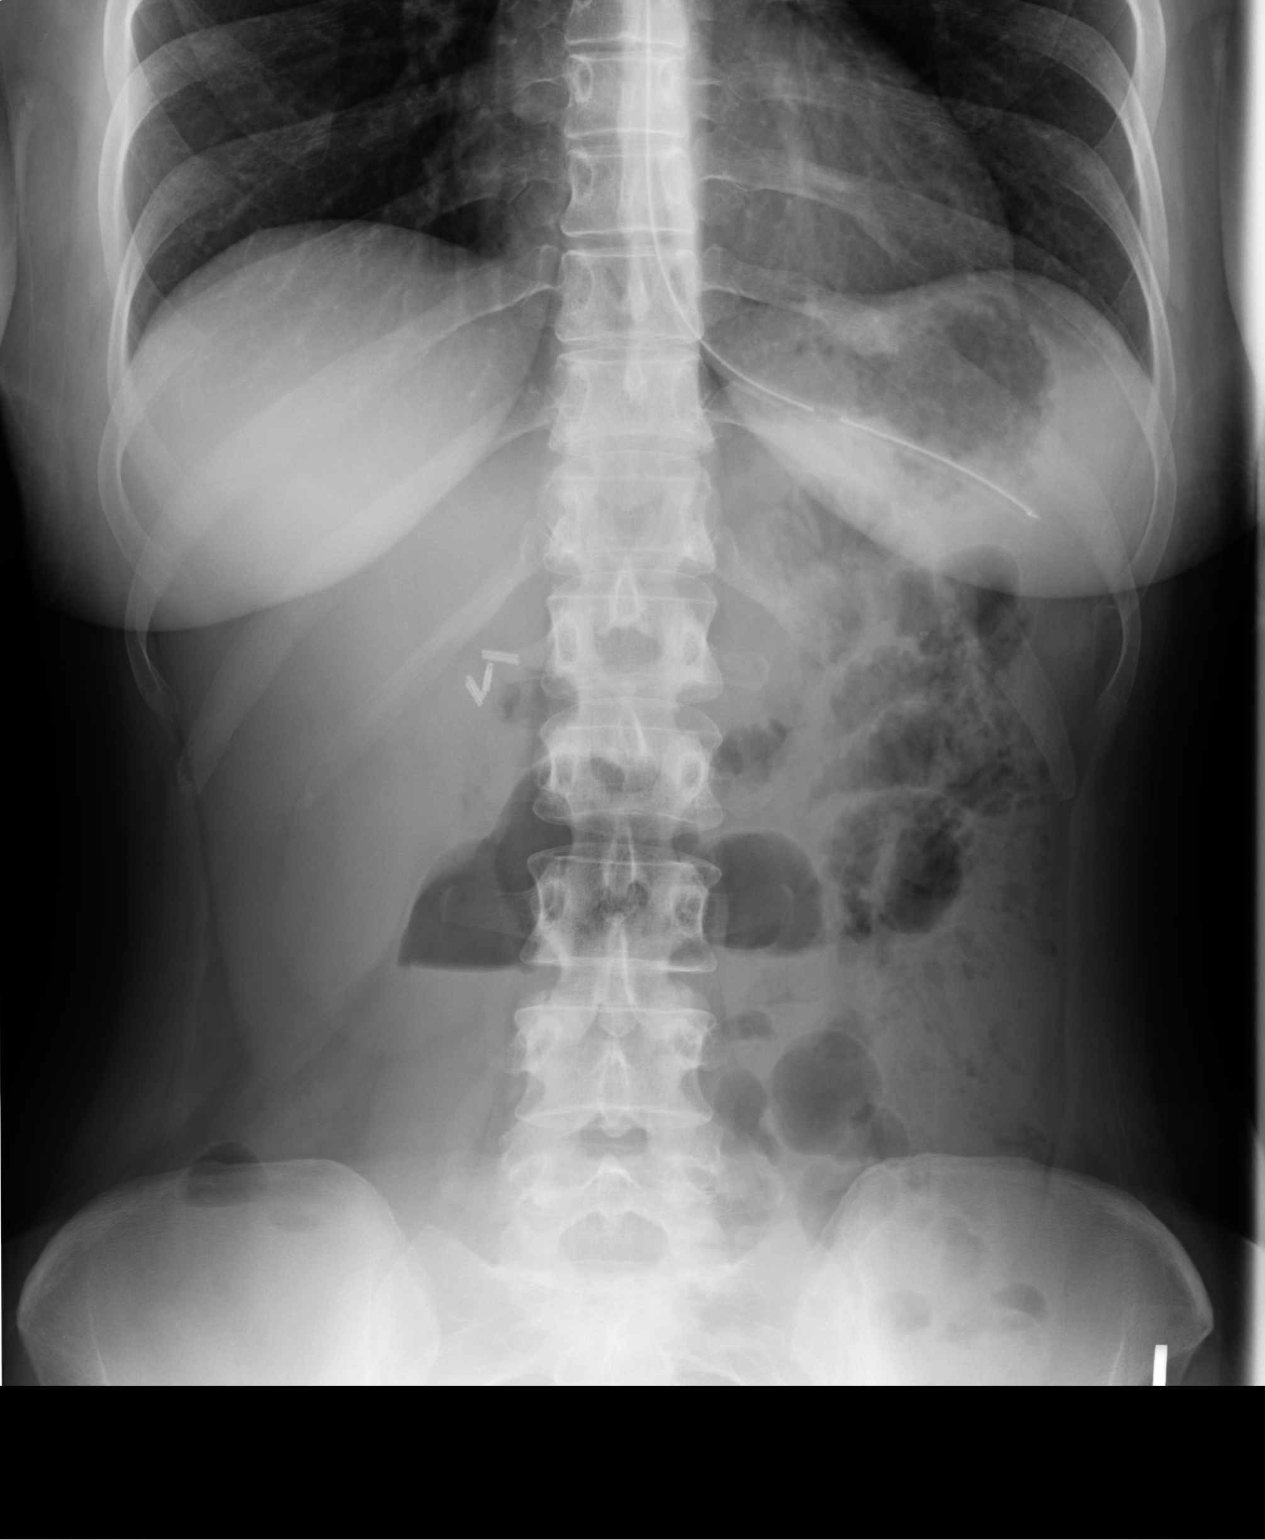
[im 2/2]
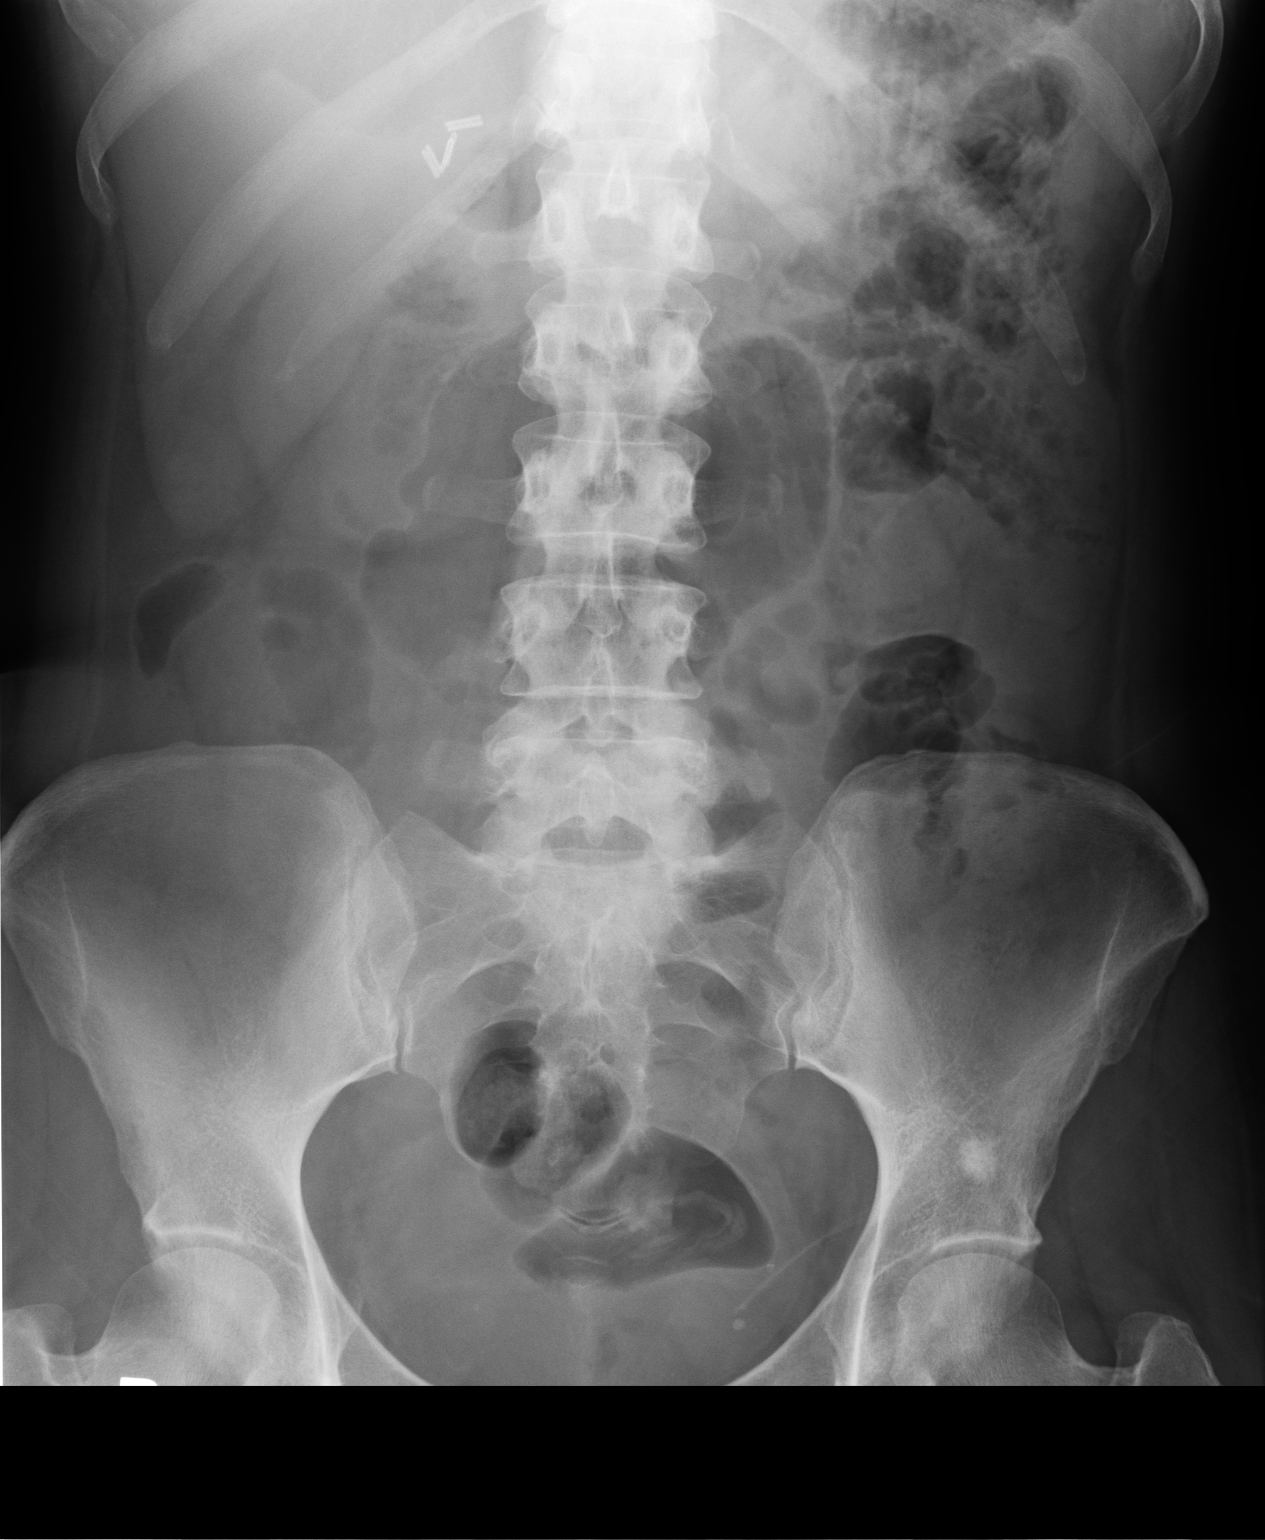

[2 of 2 positions shown; findings below may reference images not displayed]

PROCEDURE:     DXR - DXR ABDOMEN 2 V FLAT AND ERECT  - April 29, 2009 [DATE]

RESULT:     An NG tube is seen with tip projecting in the region of the
stomach. Air is seen within air-filled loops of large and small bowel.
Air-fluid levels are appreciated within the central portion of the abdomen.
The visualized bony skeleton demonstrates no evidence of fracture or
dislocation. A moderate amount of stool is appreciated in the region of the
descending colon. Sclerotic density with intervening trabecular markings
projects within the lower portion of the left ilium.
IMPRESSION: 1. Nonspecific, non-obstructive bowel gas pattern.
2. Likely a bone island within the ilium on the left.

## 2009-04-30 IMAGING — CT CT ABD-PELV W/ CM
1 of 2 series · 15 of 32 positions shown, 19 images · IV contrast (isovue)
Comparison: none

REASON FOR EXAM: (1) pain, vomiting, Crohn's disease; (2) same
COMMENTS:

PROCEDURE:     CT  - CT ABDOMEN / PELVIS  W  - April 30, 2009  [DATE]
RESULT:     CT abdomen pelvis dated 04/30/2009 comparison to prior study
dated 04/28/2009.
TECHNIQUE: Helical 5 mm sections were obtained from the lung bases through
the pubic symphysis status post intravenous administration of 100 mL Isovue
370 and oral contrast.

[Series 2: soft tissue · axial · 0.64mm/px · z∈[-1104,-664]mm · 15 of 96 slices shown, 19 images]
[im 4/96  soft-tissue]
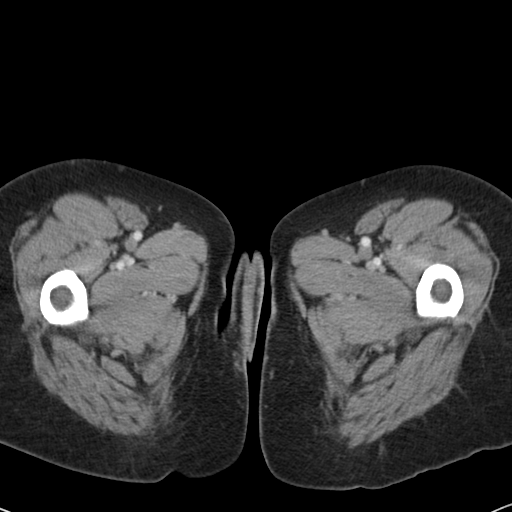
[im 4/96  bone]
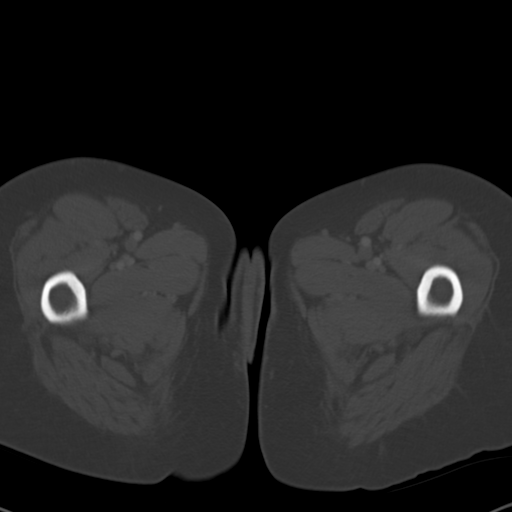
[im 12/96  soft-tissue]
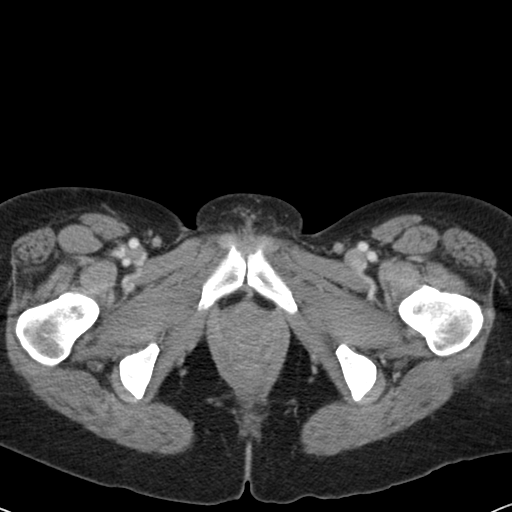
[im 20/96  soft-tissue]
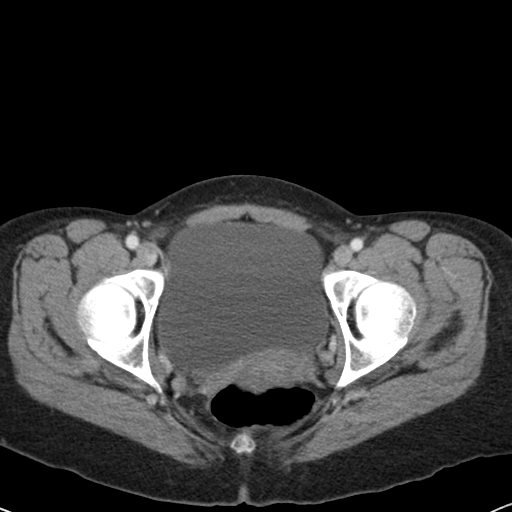
[im 28/96  soft-tissue]
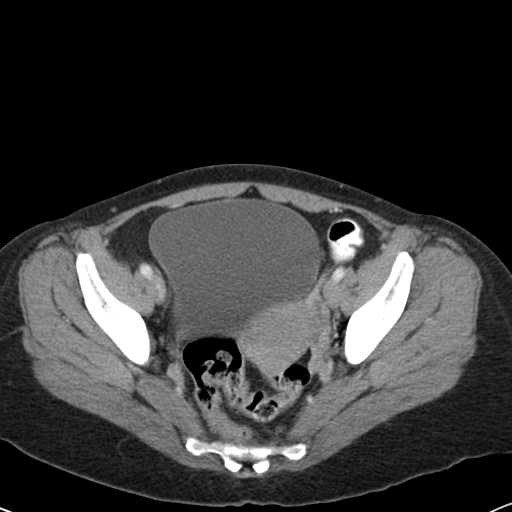
[im 32/96  soft-tissue]
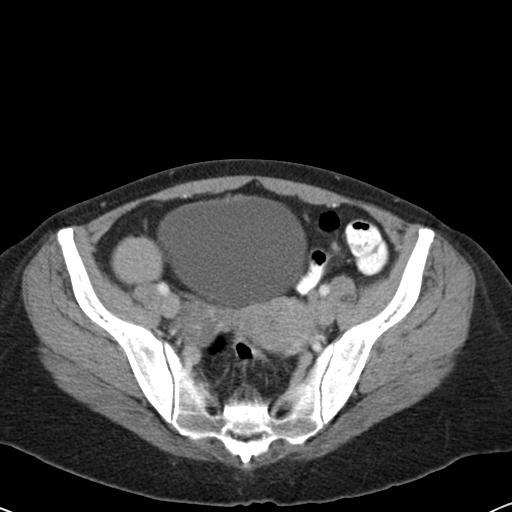
[im 40/96  soft-tissue]
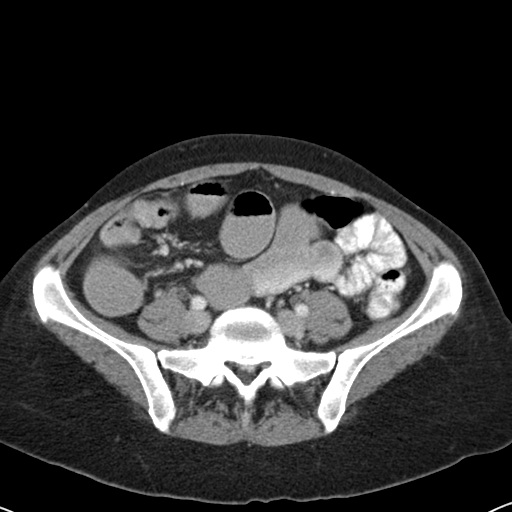
[im 48/96  soft-tissue]
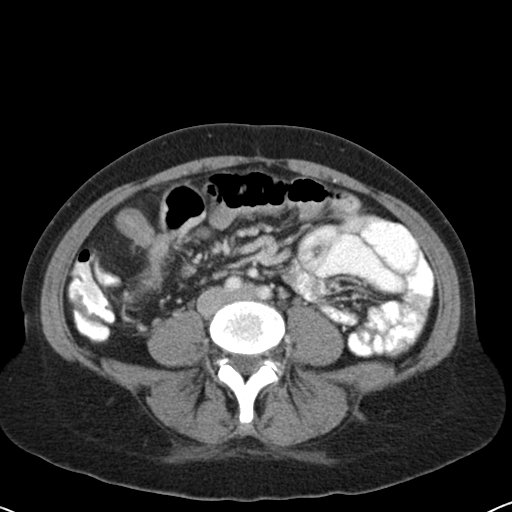
[im 56/96  soft-tissue]
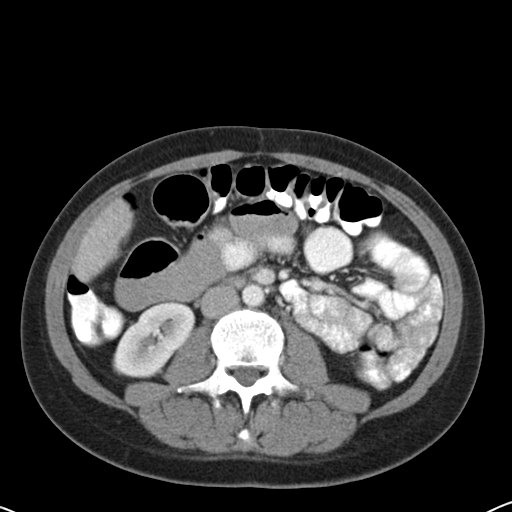
[im 64/96  soft-tissue]
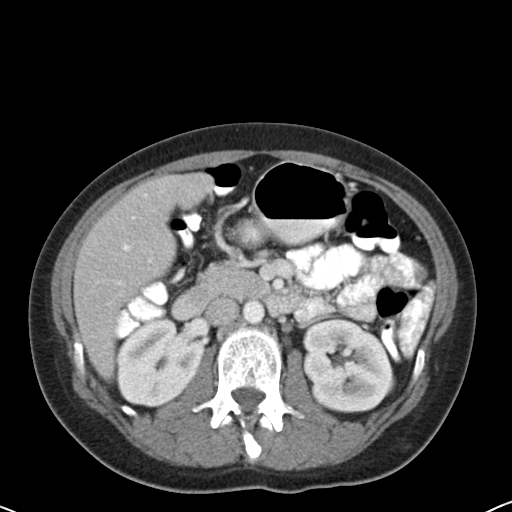
[im 64/96  bone]
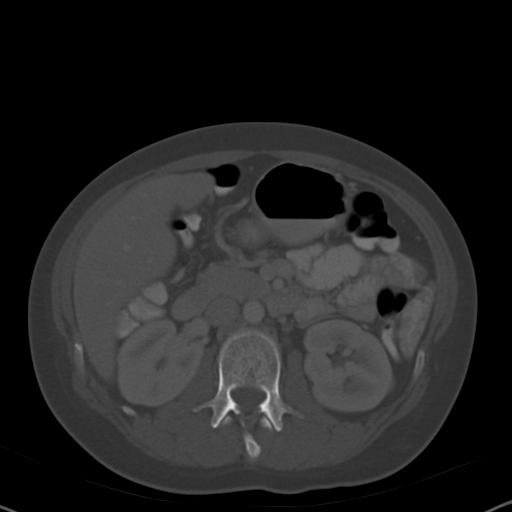
[im 68/96  soft-tissue]
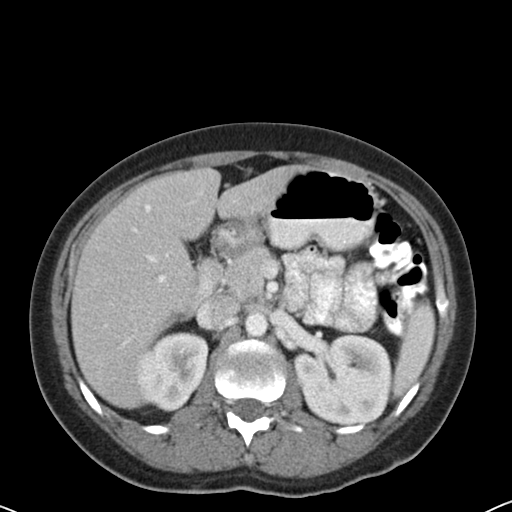
[im 76/96  soft-tissue]
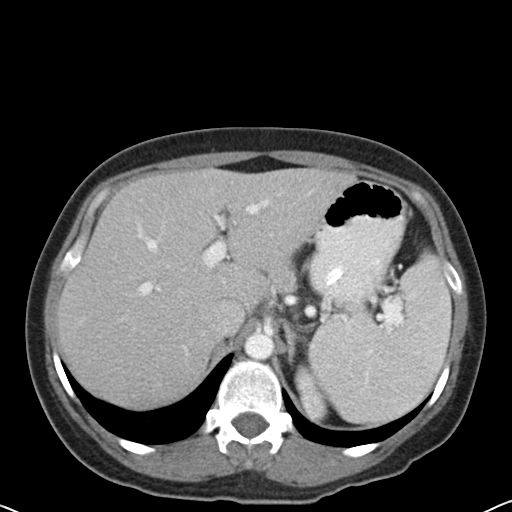
[im 80/96  lung]
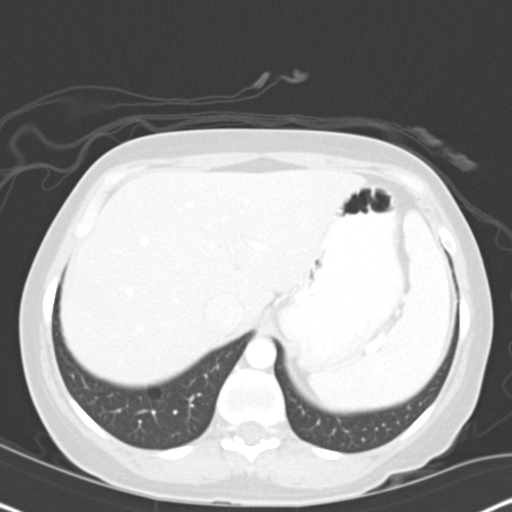
[im 84/96  soft-tissue]
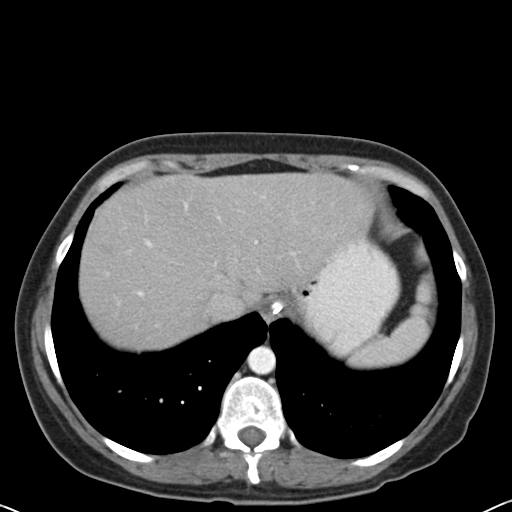
[im 84/96  lung]
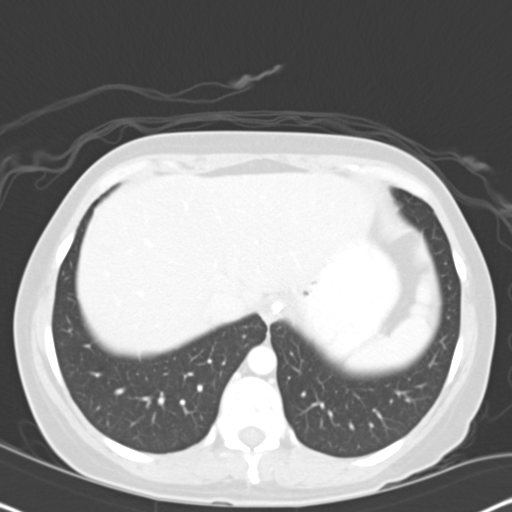
[im 88/96  lung]
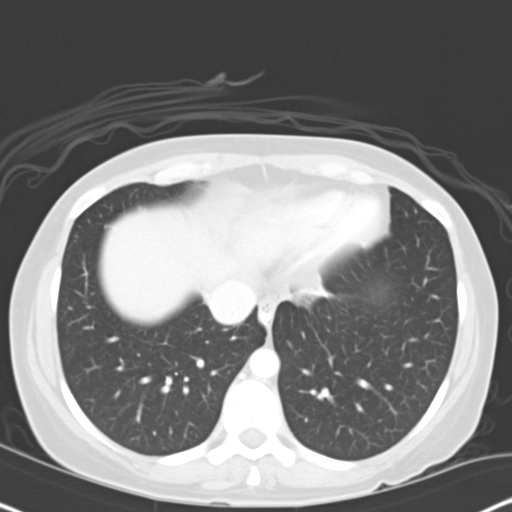
[im 92/96  soft-tissue]
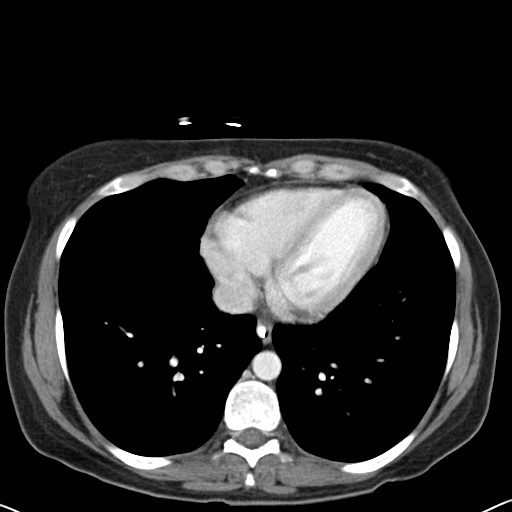
[im 92/96  lung]
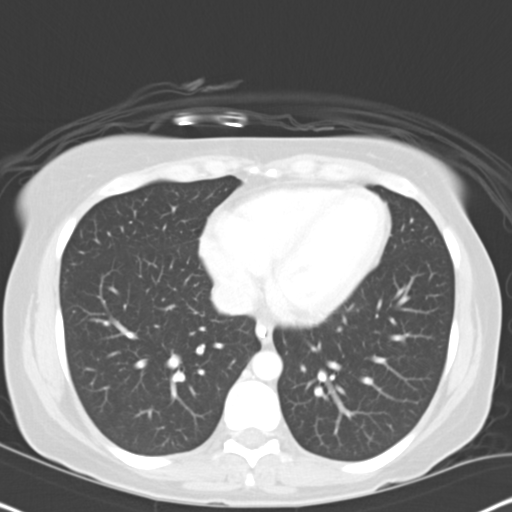

[15 of 32 positions shown; findings below may reference images not displayed]

FINDINGS: Evaluation of the lung bases demonstrates no gross abnormalities.

The liver, spleen, adrenals, pancreas, kidneys are unremarkable. Bases
status post cholecystectomy. The celiac, SMA, IMA portal vein and SMV are
opacified. Is no evidence of upper abdominal free fluid drainable loculated
fluid collections. When compared to the previous study there has been
decrease in the amount in conspicuity of the dilated loops of small bowel.
Residual moderately distended/dilated loops of small bowel are appreciated
which appear to be fluid filled. Bowel wall thickening is once again
identified in the region of the terminal ileum. There has been decreased in
the amount of induration within the surrounding mesenteric fat. No drainable
loculated fluid collections identified nor significant free fluid. There is
no CT evidence of abdominal aortic aneurysm. The urinary bladder is
distended with urine.
IMPRESSION: Findings consistent with a resolving small bowel
obstruction. There are residual areas of inflammatory changes within the
distal and terminal ileum consistent with the patient's history of Crohn's
disease though these findings to have mildly decreased in conspicuity. No
associated drainable loculated fluid collection appreciated to suggest
sequela of an abscess.

## 2009-05-14 ENCOUNTER — Ambulatory Visit: Payer: Self-pay | Admitting: General Surgery

## 2009-05-14 IMAGING — MG MM CAD SCREENING MAMMO
1 series · 4 of 4 positions shown · non-contrast
Comparison: none

REASON FOR EXAM: screening mammo
COMMENTS:

[R CC · right · 4 of 4 slices shown]
[im 1/4]
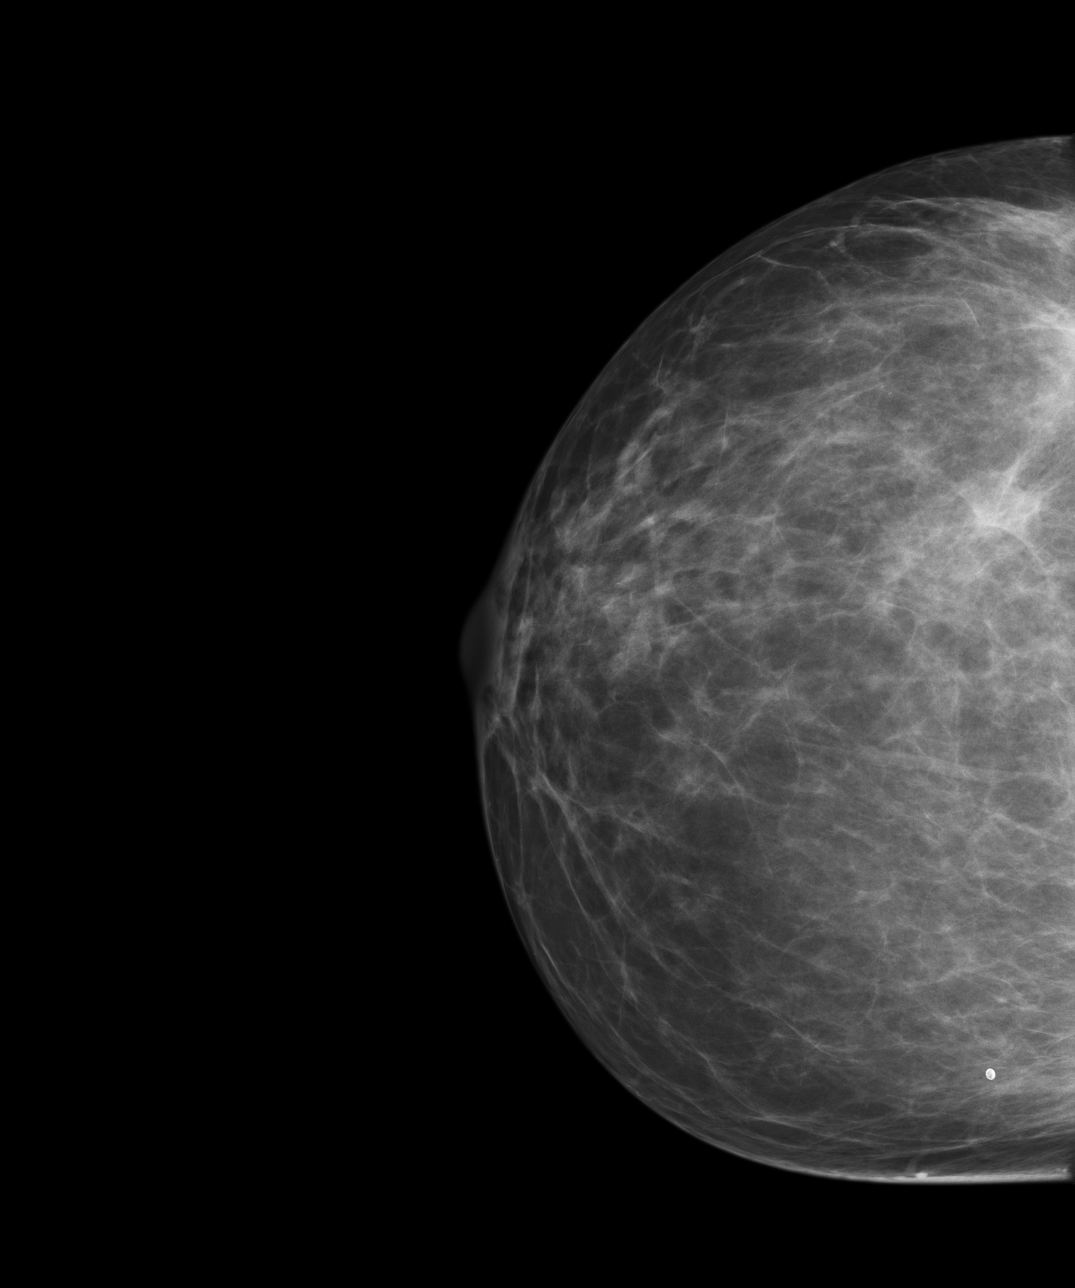
[im 2/4]
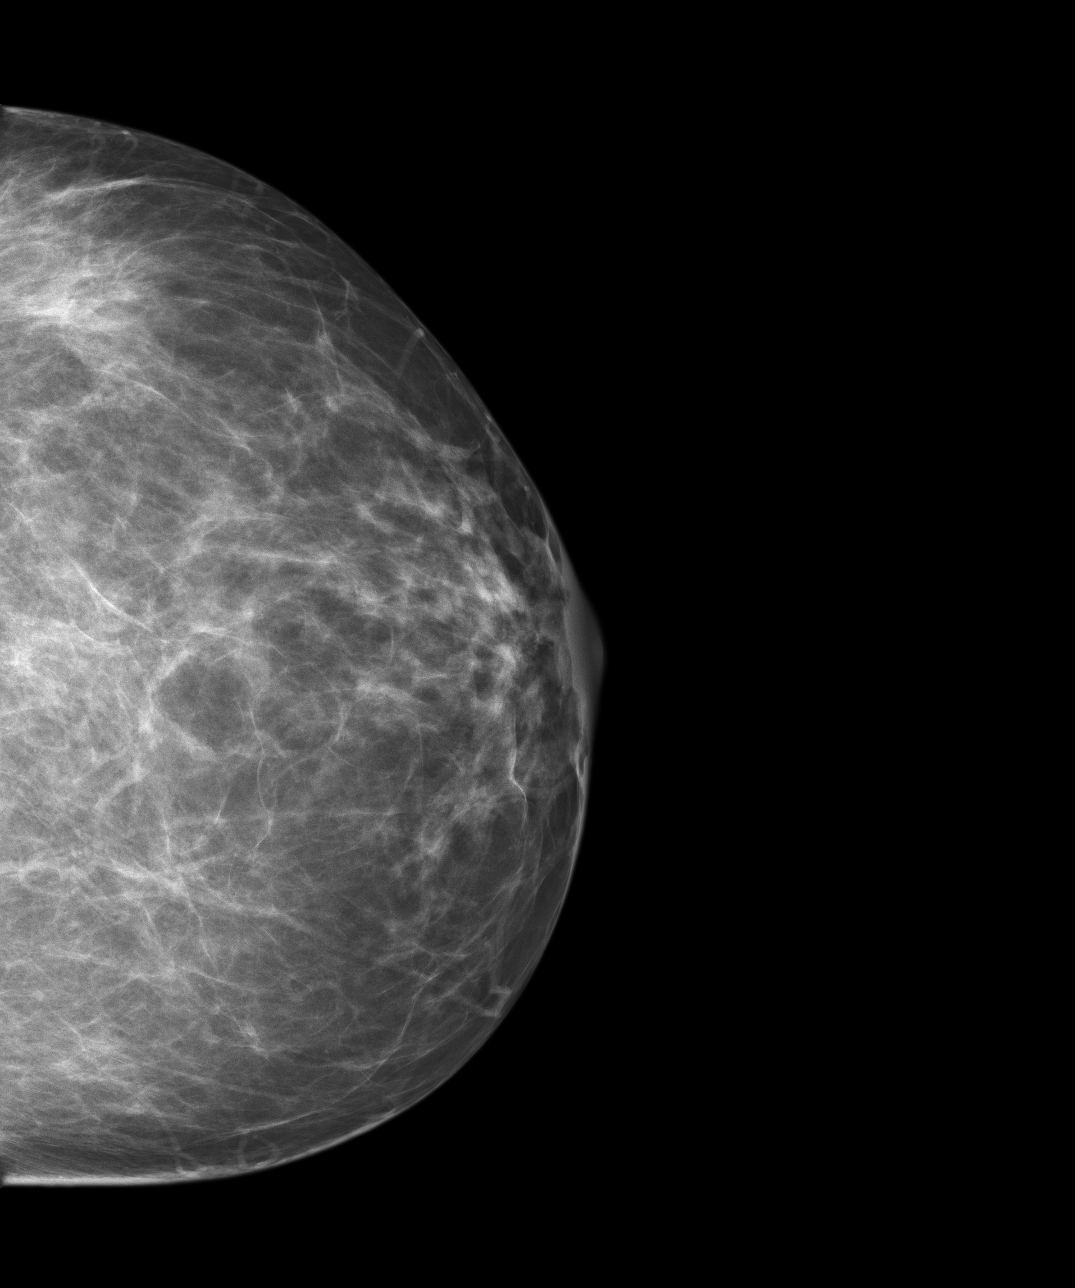
[im 3/4]
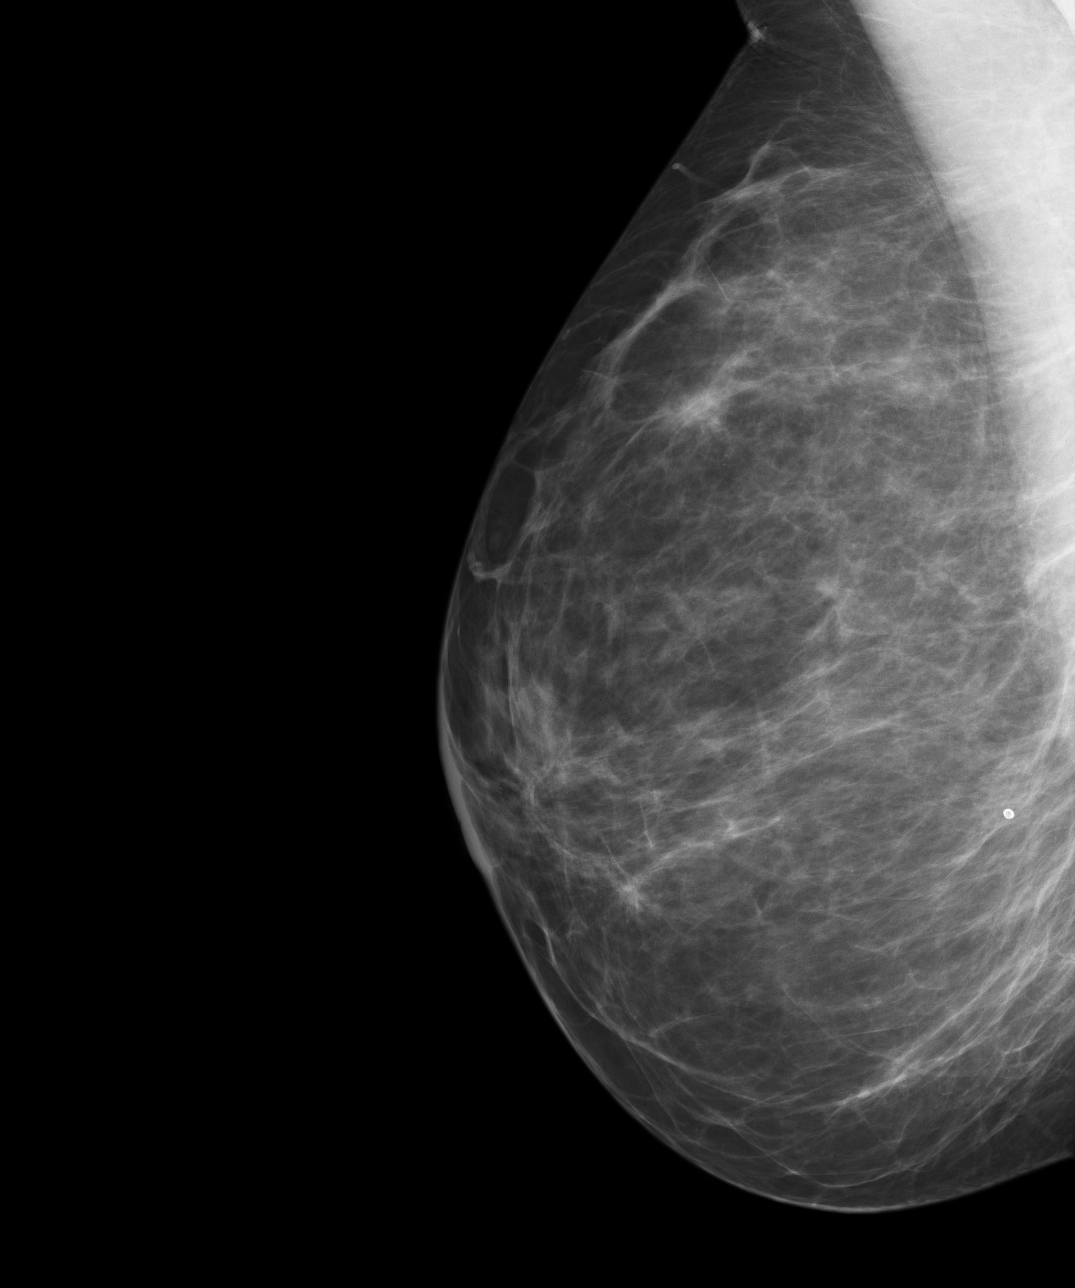
[im 4/4]
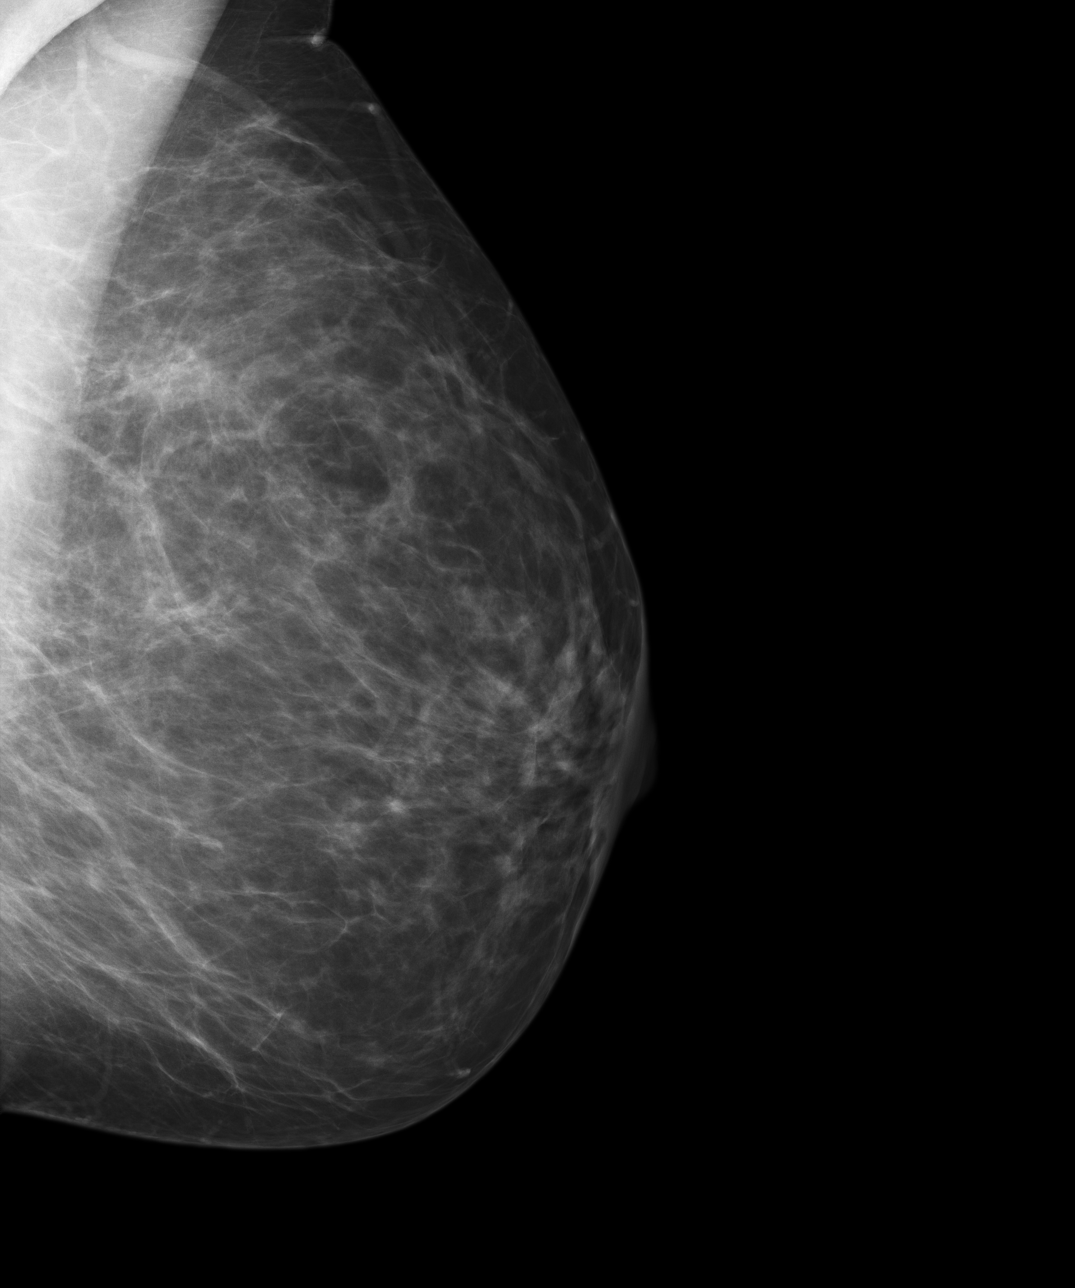

[4 of 4 positions shown; findings below may reference images not displayed]

PROCEDURE:     MAM - MAM DGTL SCREENING MAMMO W/CAD  - May 14, 2009  [DATE]

RESULT:     Comparison is made to a prior digital study dated 15 March, 2007,
as well as 25 December, 2004 and to a film screening study 15 March, 2008.

The breasts exhibit a moderately dense parenchymal pattern. There is no
dominant mass. There are no malignant appearing groupings of
microcalcification. No area of new architectural distortion is seen.
IMPRESSION: 1.I do not see findings suspicious for malignancy.

BI-RADS: Category 2 - Benign Findings

RECOMMENDATIONS:

1.     Please continue to encourage yearly mammographic follow-up.

A NEGATIVE MAMMOGRAM REPORT DOES NOT PRECLUDE BIOPSY OR OTHER EVALUATION OF
A CLINICALLY PALPABLE OR OTHERWISE SUSPICIOUS MASS OR LESION. BREAST CANCER
MAY NOT BE DETECTED BY MAMMOGRAPHY IN UP TO 10% OF CASES.

## 2009-06-17 ENCOUNTER — Encounter (INDEPENDENT_AMBULATORY_CARE_PROVIDER_SITE_OTHER): Payer: Self-pay | Admitting: Internal Medicine

## 2009-07-02 ENCOUNTER — Ambulatory Visit: Payer: Self-pay | Admitting: Unknown Physician Specialty

## 2009-07-02 IMAGING — CT CT ABD-PELV W/ CM
1 of 2 series · 15 of 32 positions shown, 19 images · IV contrast (isovue)
Comparison: none

REASON FOR EXAM: partial bowel obstruction    Crohn's disease  CALL
report  [id number]
COMMENTS:

PROCEDURE:     MAGANA - MAGANA ABDOMEN / PELVIS W  - July 02, 2009  [DATE]
RESULT:     Comparison is made to prior study dated 04/30/2009.
TECHNIQUE: Helical 3 mm sections were obtained from the lung bases through
the pubic symphysis status post intravenous administration of 75 ml Isovue
370 and oral contrast.

[Series 3: soft tissue · axial · 0.62mm/px · z∈[-1023,-618]mm · 15 of 149 slices shown, 19 images]
[im 7/149  soft-tissue]
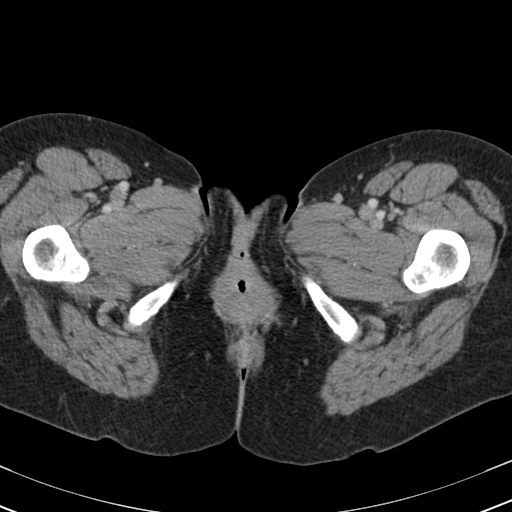
[im 7/149  bone]
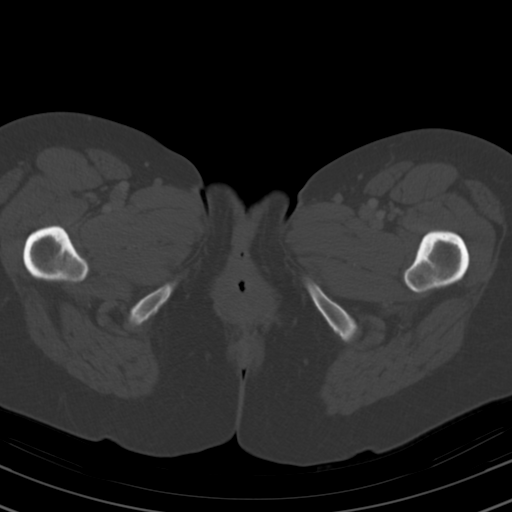
[im 20/149  soft-tissue]
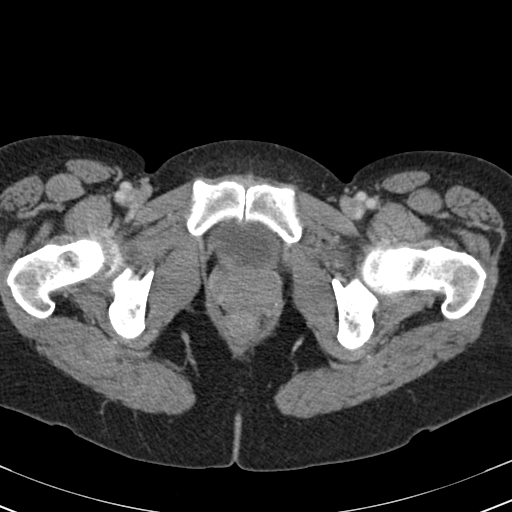
[im 33/149  soft-tissue]
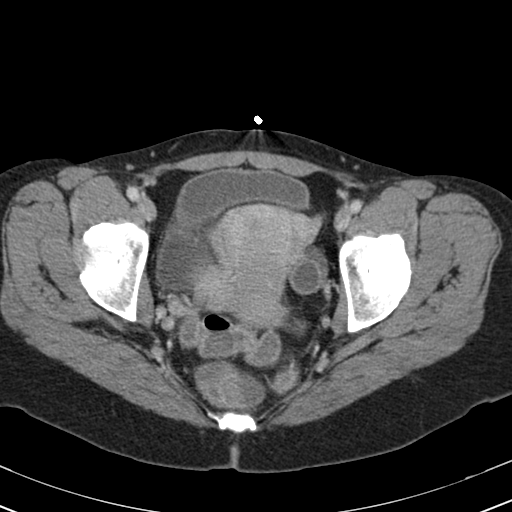
[im 39/149  soft-tissue]
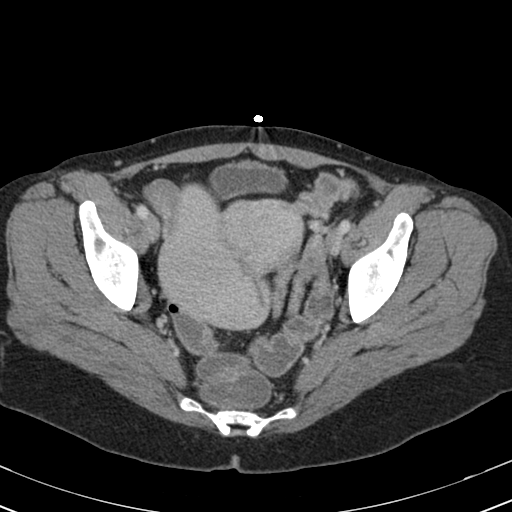
[im 52/149  soft-tissue]
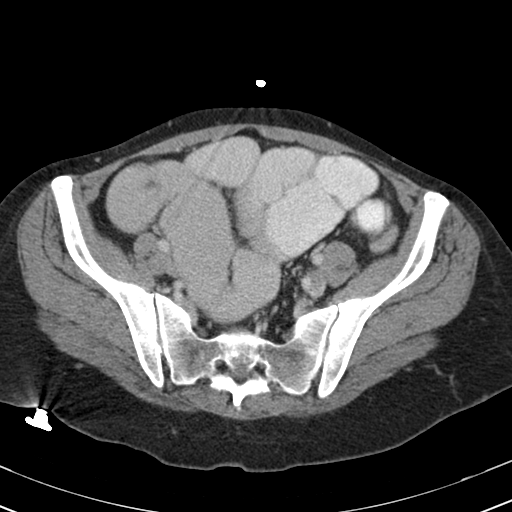
[im 65/149  soft-tissue]
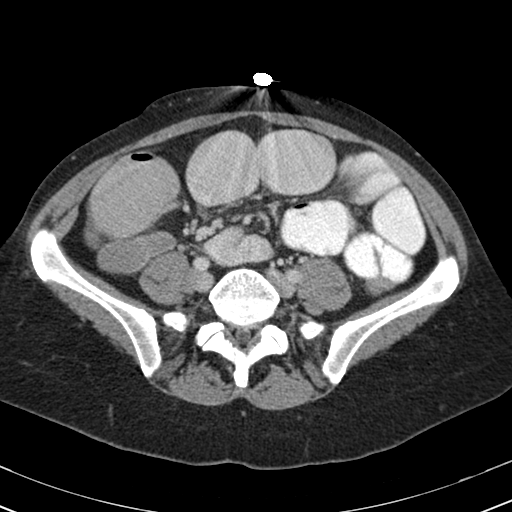
[im 78/149  soft-tissue]
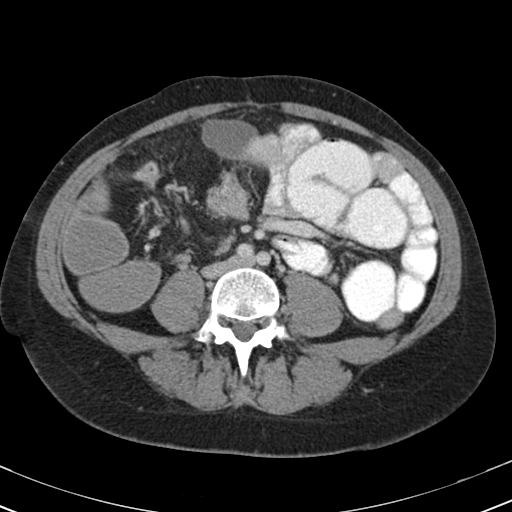
[im 84/149  soft-tissue]
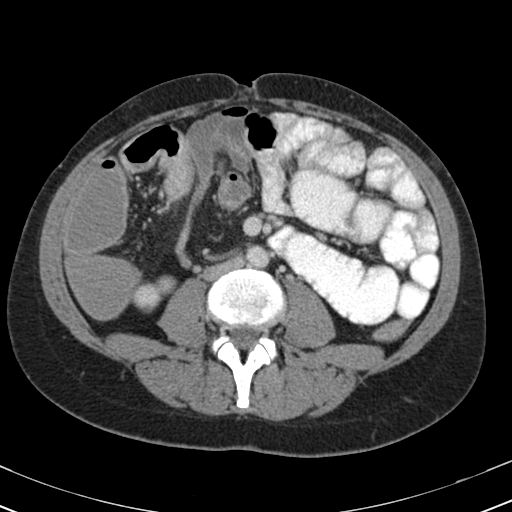
[im 97/149  soft-tissue]
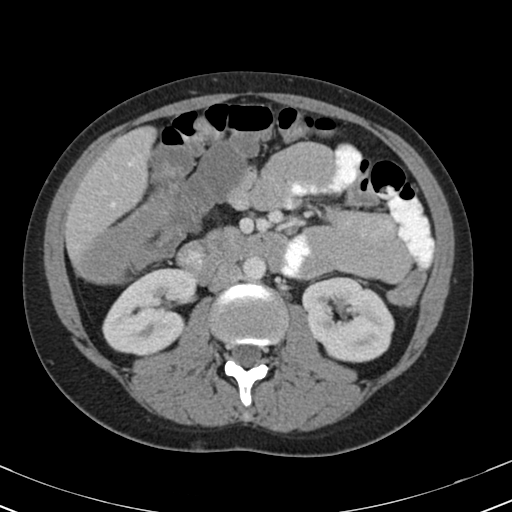
[im 97/149  bone]
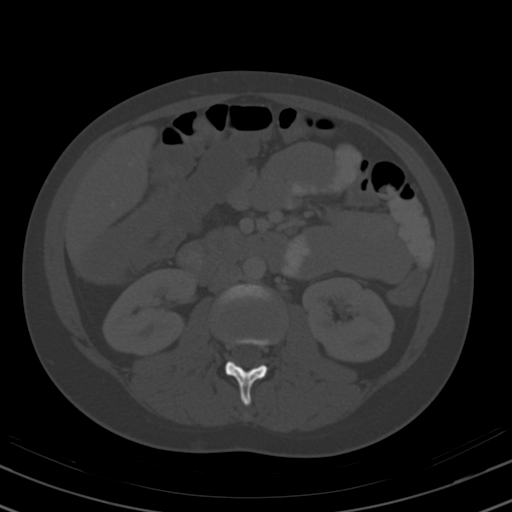
[im 110/149  soft-tissue]
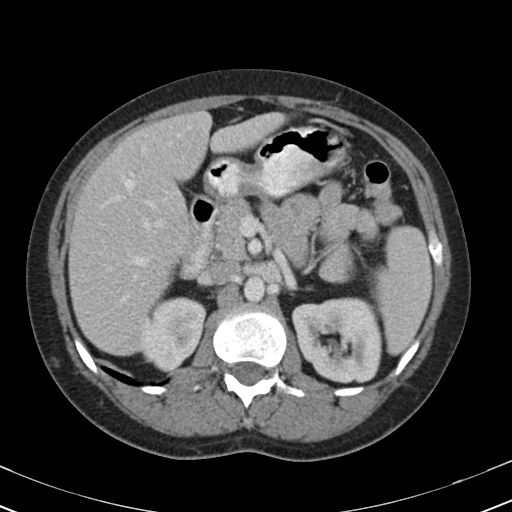
[im 116/149  soft-tissue]
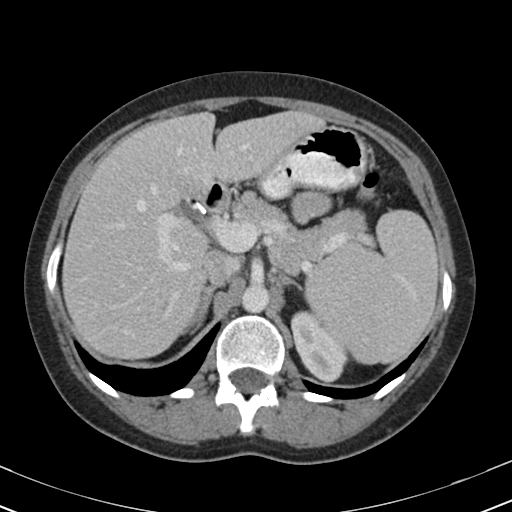
[im 123/149  lung]
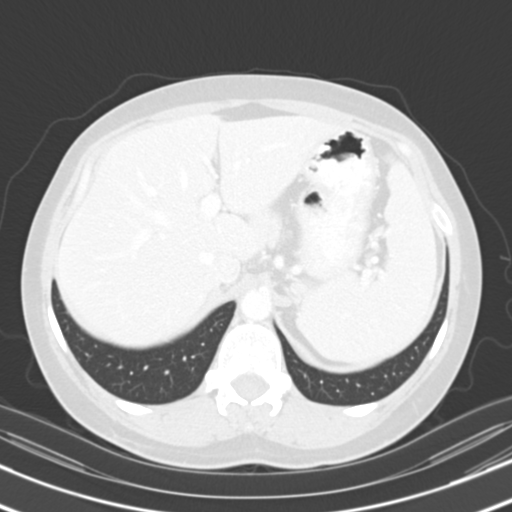
[im 129/149  soft-tissue]
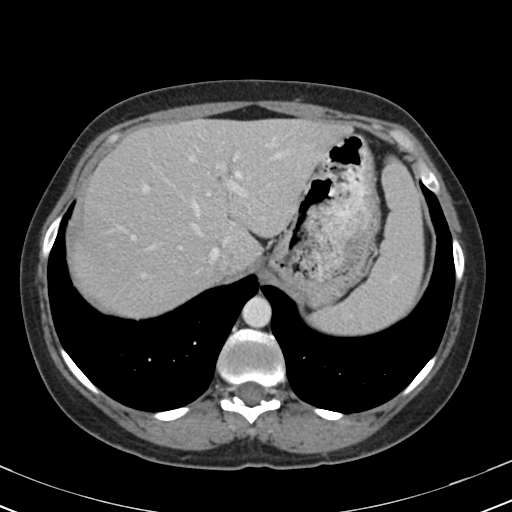
[im 129/149  lung]
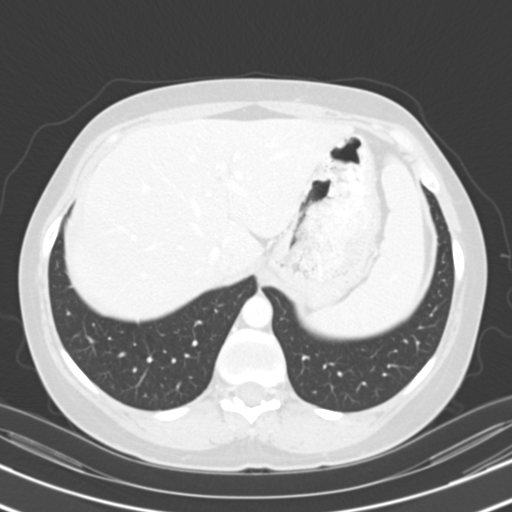
[im 136/149  lung]
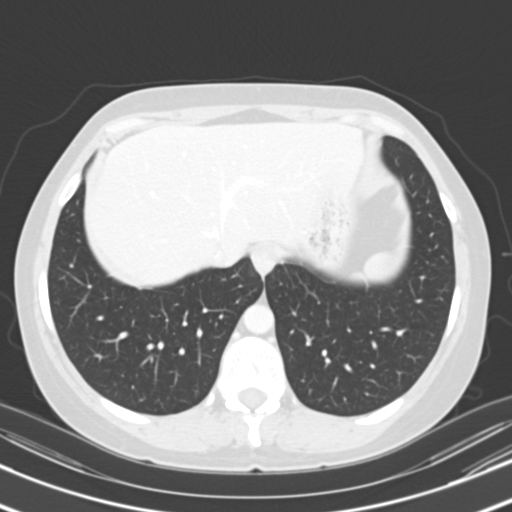
[im 142/149  soft-tissue]
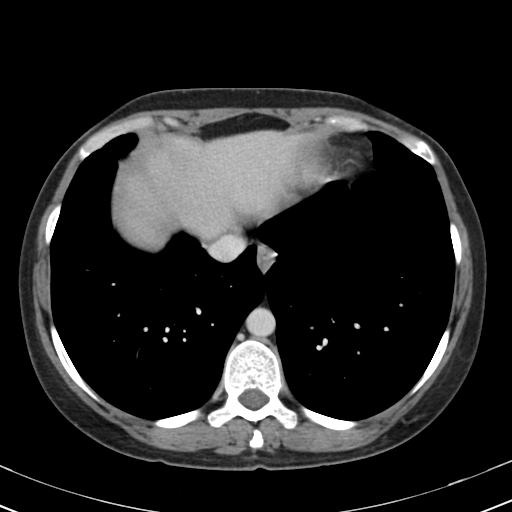
[im 142/149  lung]
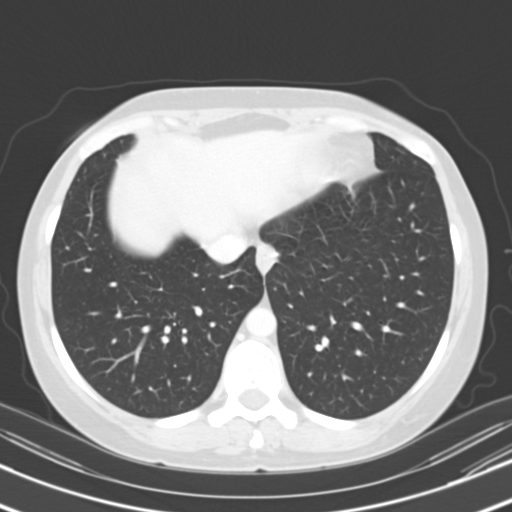

[15 of 32 positions shown; findings below may reference images not displayed]

FINDINGS: Evaluation of the lung bases demonstrates no gross abnormalities.

The liver, spleen, adrenals, pancreas and kidneys are unremarkable. Multiple
dilated loops of small bowel are appreciated within the lower abdomen and
pelvis. The descending colon is decompressed with evidence of air within
loops of transverse colon and fluid within the ascending colon. Within the
right lower quadrant there are areas of spiculated soft tissue density
extending from a loop of small bowel. These findings are more prominent when
compared to the previous study and have the appearance of scarring or
possibly adhesions. Superimposed inflammation is also a diagnostic
consideration. The small bowel is decompressed within the region of the
distal ileum. No focal drainable loculated fluid collections are
appreciated. There is no significant free fluid. There is no evidence of an
abdominal aortic aneurysm.
IMPRESSION: 1. Early or partial small bowel obstruction as described above.
2. Inflammatory changes within the right lower quadrant consistent with the
patient's history of Crohn's disease.
3. There is no evidence of drainable loculated fluid collections nor free
air.

## 2009-07-05 ENCOUNTER — Ambulatory Visit: Payer: Self-pay | Admitting: Unknown Physician Specialty

## 2009-07-05 IMAGING — CR DG UGI W/ SMALL BOWEL
1 series · 1 of 1 positions shown · non-contrast
Comparison: none

REASON FOR EXAM: CRONHS
COMMENTS:

[view not recorded]
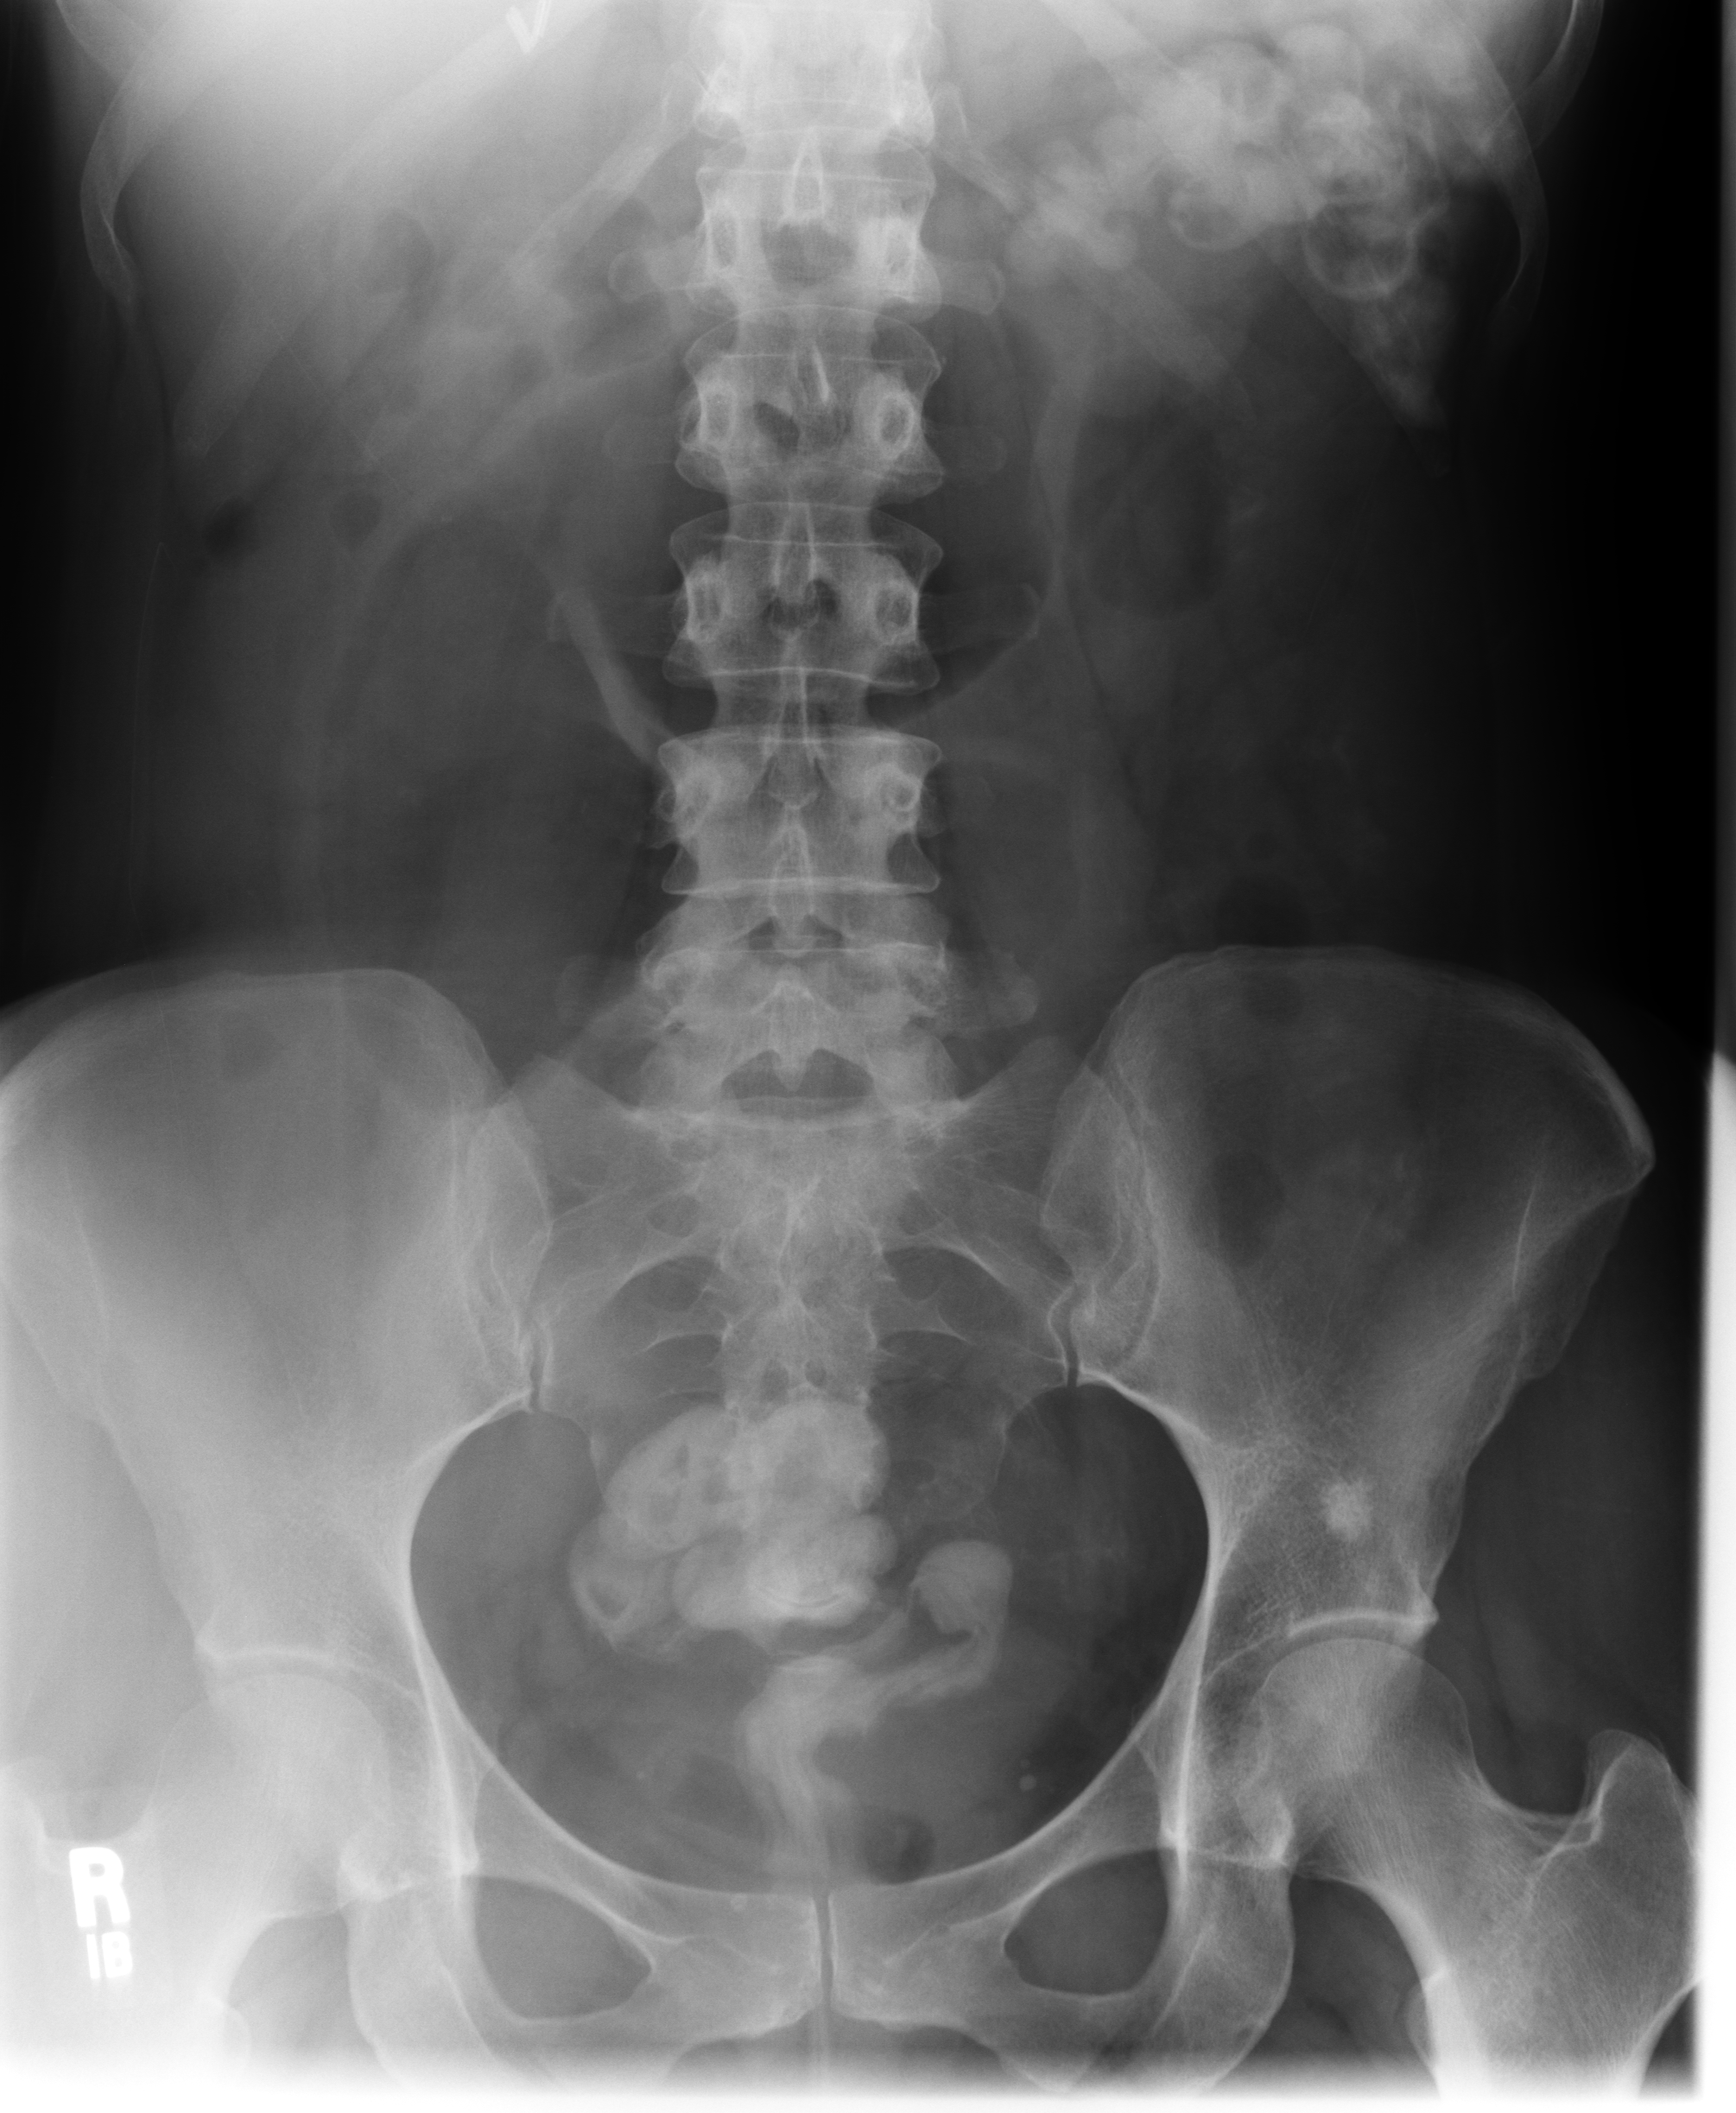

[1 of 1 positions shown; findings below may reference images not displayed]

PROCEDURE:     FL  - FL UPPER GI WITH SMALL BOWEL  - July 05, 2009  [DATE]

RESULT:     This is a no charge KUB film. The patient was scheduled for an
upper GI series with small bowel follow-through. However, there is
considerable contrast remaining in the region of the transverse colon as
well is in the pelvis from the patient's recent abdominal CT scan. Thus, the
study was not performed and will be rescheduled at a later date.

 Incidental note is made of a sclerotic focus in the left iliac bone
approximately 2 cm above the roof of the acetabulum. This is a nonspecific
finding but will merit further evaluation. It was faintly visible on the
previous CT scan on image 111. A nuclear bone scan is recommended.
IMPRESSION: 1. There is a no charge KUB film.
2. Please see the discussion above regarding a sclerotic focus in the
inferior aspect of the left iliac bone.
3. The patient will be rescheduled for the upper GI series and small bowel
follow-through.

## 2009-07-08 HISTORY — PX: OTHER SURGICAL HISTORY: SHX169

## 2009-07-10 ENCOUNTER — Ambulatory Visit: Payer: Self-pay | Admitting: Unknown Physician Specialty

## 2009-07-10 IMAGING — RF DG UGI W/ SMALL BOWEL
1 series · 14 of 23 positions shown · non-contrast
Comparison: none

REASON FOR EXAM: CHRONS
COMMENTS:

[Series 1: run · 14 of 23 slices shown]
[im 1/23]
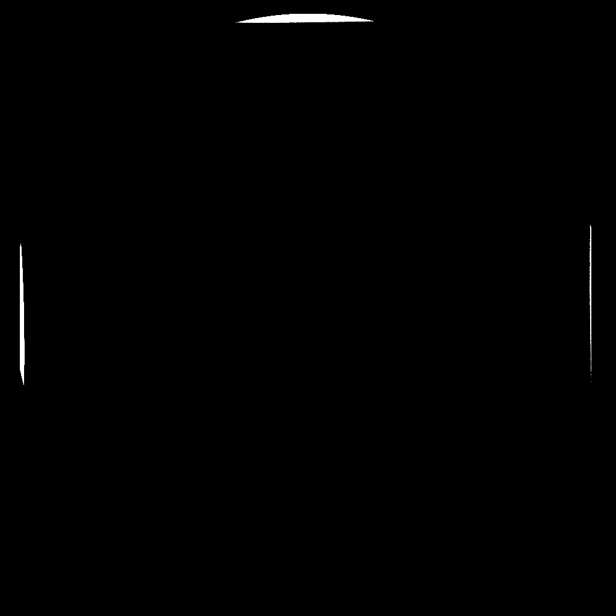
[im 3/23]
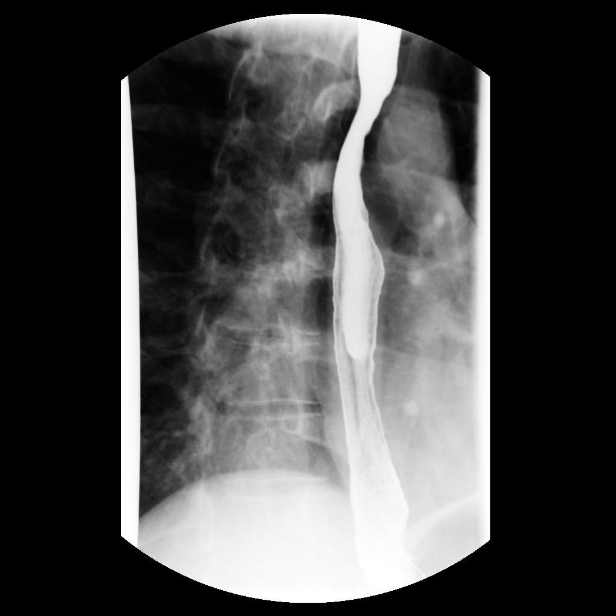
[im 5/23]
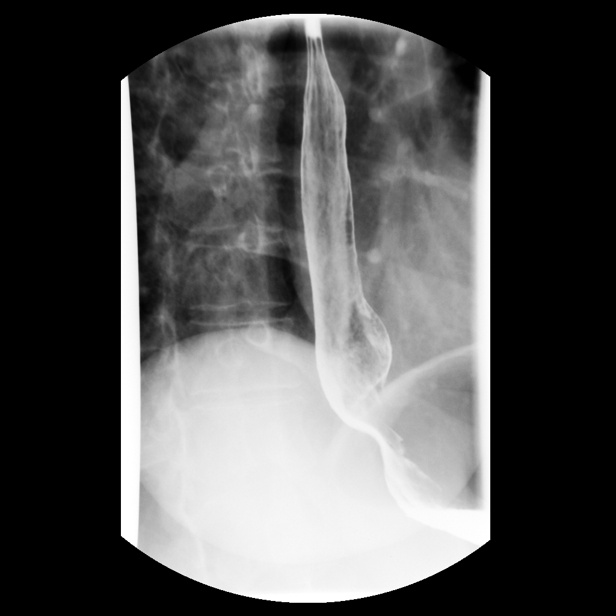
[im 6/23]
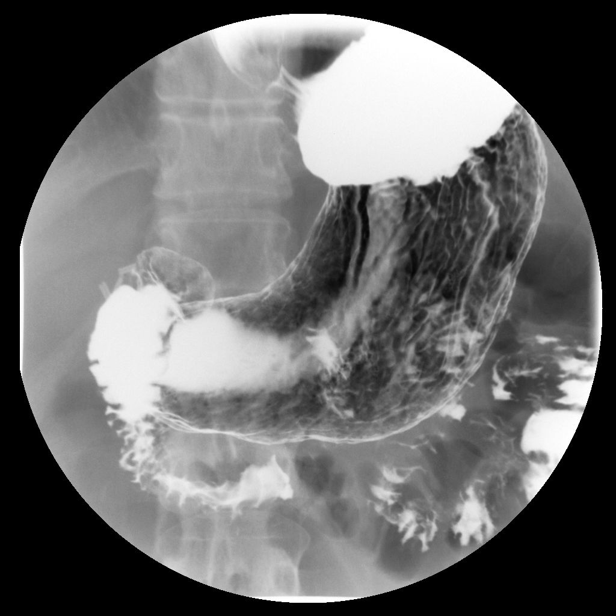
[im 8/23]
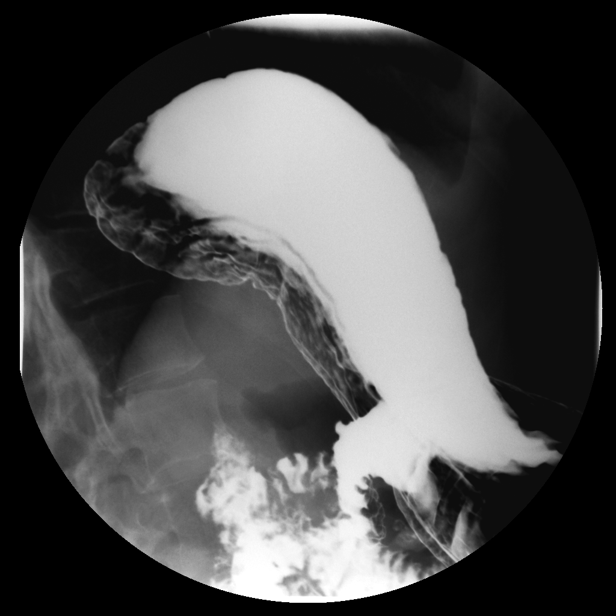
[im 10/23]
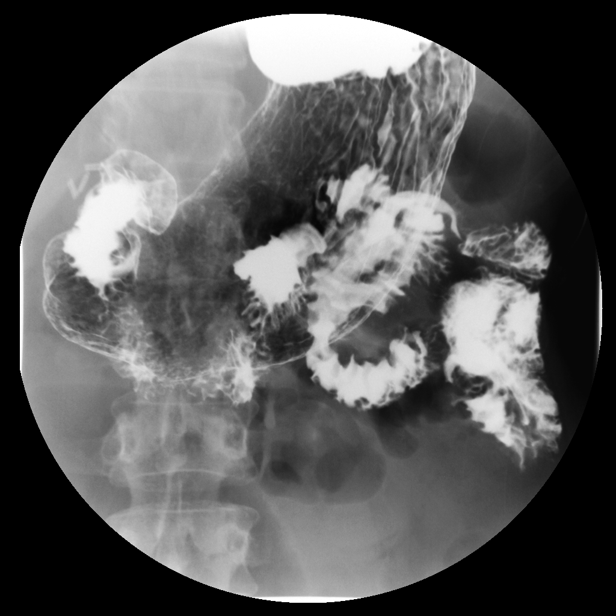
[im 11/23]
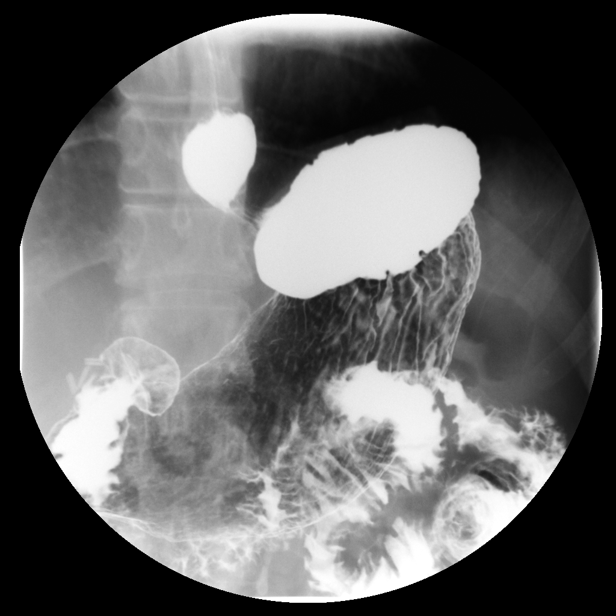
[im 13/23]
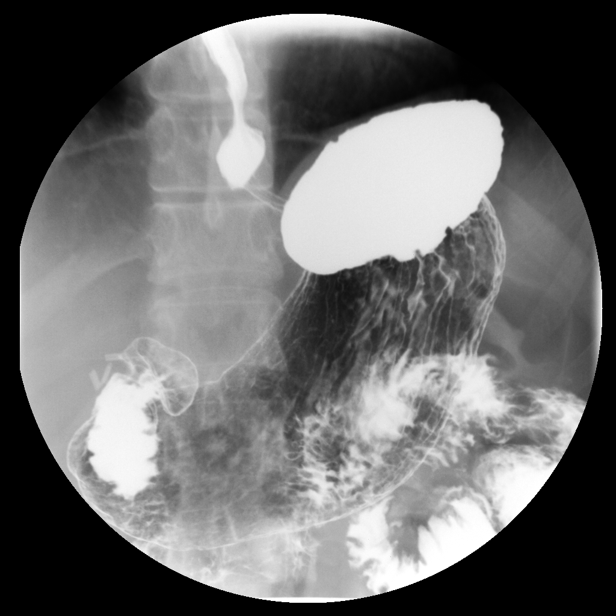
[im 14/23]
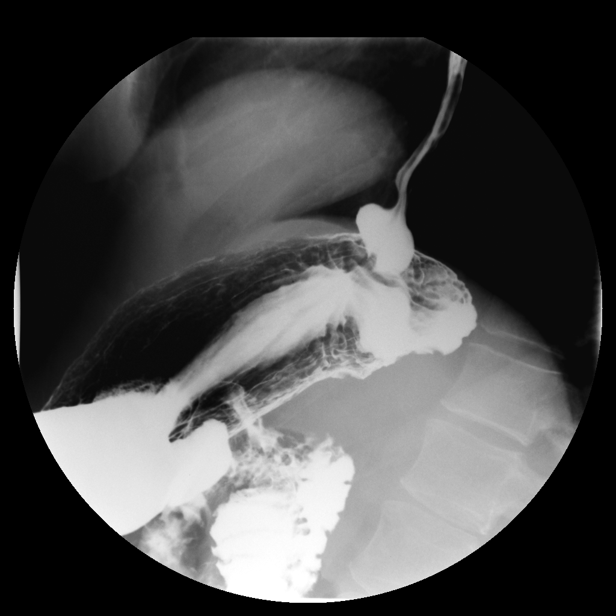
[im 16/23]
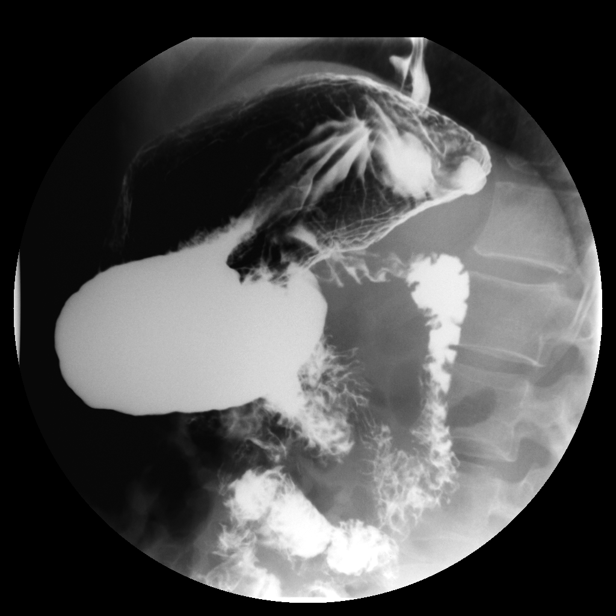
[im 18/23]
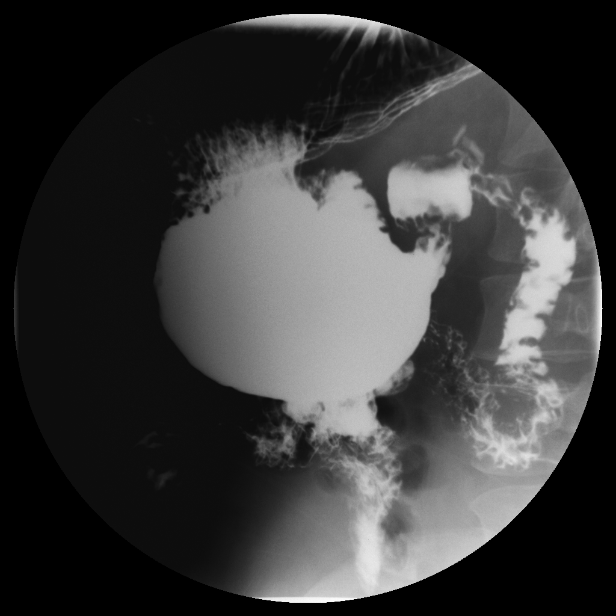
[im 19/23]
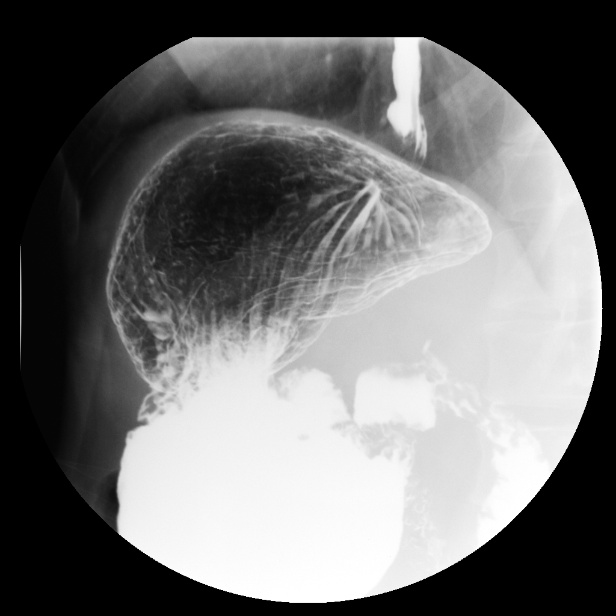
[im 21/23]
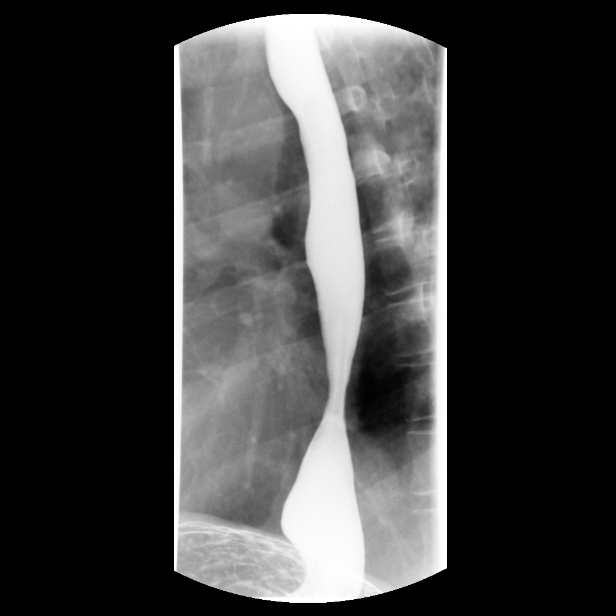
[im 23/23]
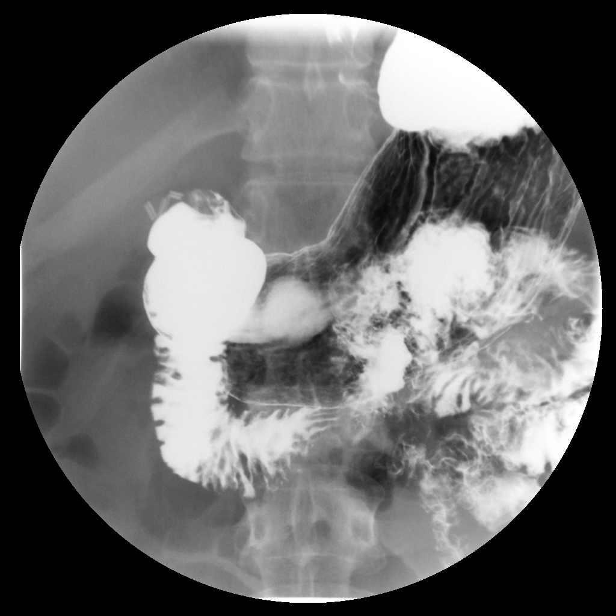

[14 of 23 positions shown; findings below may reference images not displayed]

PROCEDURE:     FL  - FL UPPER GI WITH SMALL BOWEL  - July 10, 2009  [DATE]

RESULT:       The patient was given effervescent crystals and the upper
gastrointestinal tract was evaluated status post administration of oral
barium. This was followed by continued administration of oral barium with
evaluation of the small bowel as the barium column traversed through the
small bowel. Transit time to the terminal ileum is approximately 3.5 hours.

Evaluation of the upper gastrointestinal tract demonstrates no evidence of
filling defects, strictural narrowing, focal outpouching or fusiform
dilatation. The patient demonstrated gastroesophageal reflux to the
midesophagus. There is appropriate relaxation of the lower esophageal
sphincter and no evidence of a hiatal hernia. Evaluation of the duodenum
demonstrates appropriate rotation of the duodenum and placement of the
ligament of Treitz. The stomach demonstrates no radiographic abnormalities.

This was followed by evaluation of the small bowel. As stated above, there
is delayed transit time to the terminal ileum. Within the distal ileum,
multiple dilated loops of contrast-filled small bowel are appreciated.
Within the mid to lower portion of the abdomen on the right, a focal area of
multiple regions of strictural narrowing identified within the small bowel.
Fistulas within this area cannot be excluded. The terminal ileum was
obscured by the multiple distended loops of contrast-filled small bowel. The
remaining loops of small bowel demonstrate no further abnormalities.  Scout
image of the abdomen and pelvis was obtained. Air is seen within nondilated
loops of large small bowel. A sclerotic density projects along the ileum on
the left with intervening trabecular markings likely representing bone
island particularly in the absence of known neoplastic disease.
IMPRESSION: 1. Multiple areas of strictural narrowing within the distal ileum within the
right mid to lower quadrant of the abdomen.
2. Gastroesophageal reflux to the midesophagus.

## 2009-07-10 IMAGING — RF DG UGI W/ SMALL BOWEL
1 series · 14 of 24 positions shown · non-contrast
Comparison: none

REASON FOR EXAM: CHRONS
COMMENTS:

[Series 1: run · 14 of 25 slices shown]
[im 1/25]
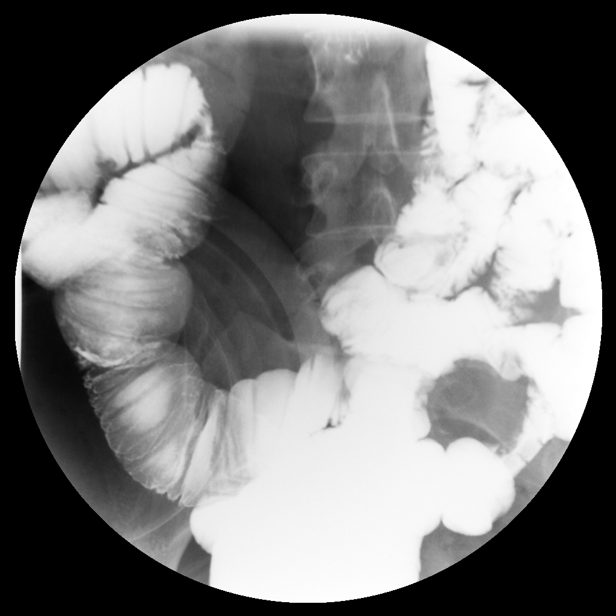
[im 3/25]
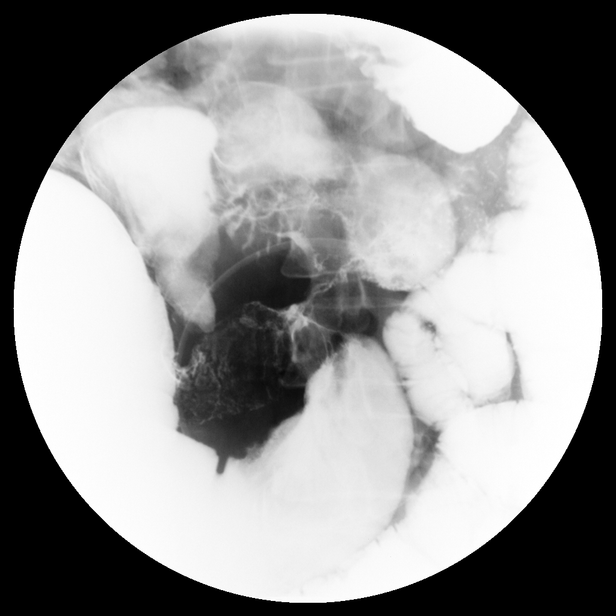
[im 5/25]
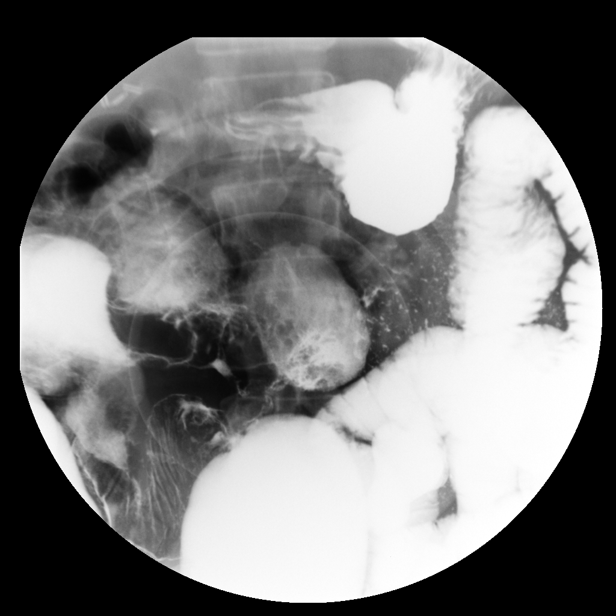
[im 7/25]
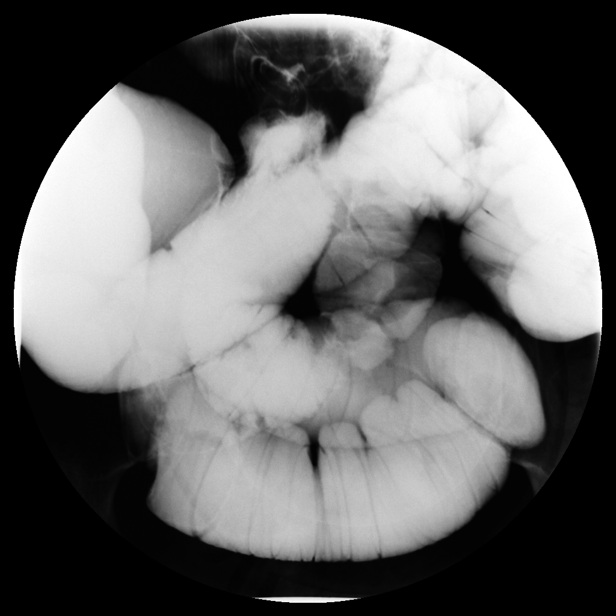
[im 8/25]
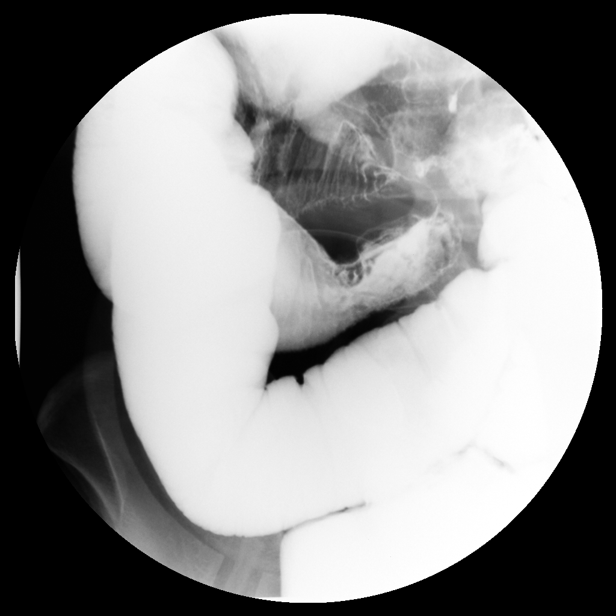
[im 10/25]
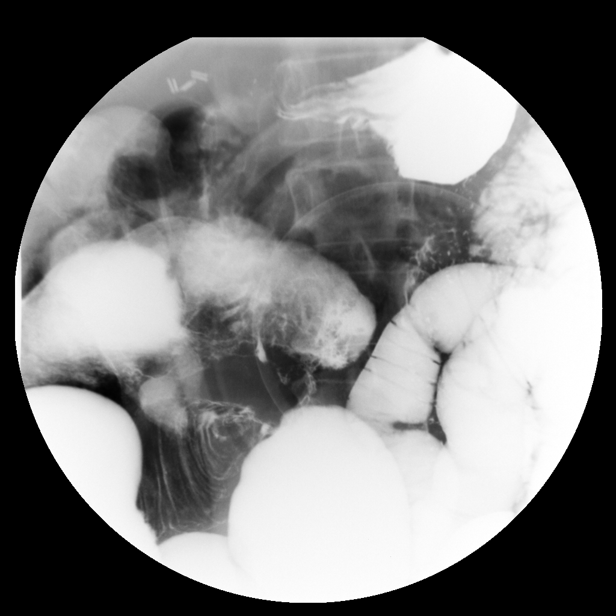
[im 12/25]
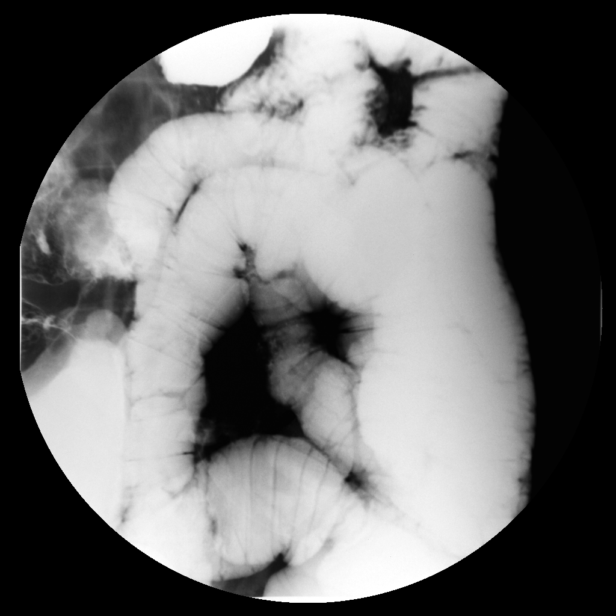
[im 13/25]
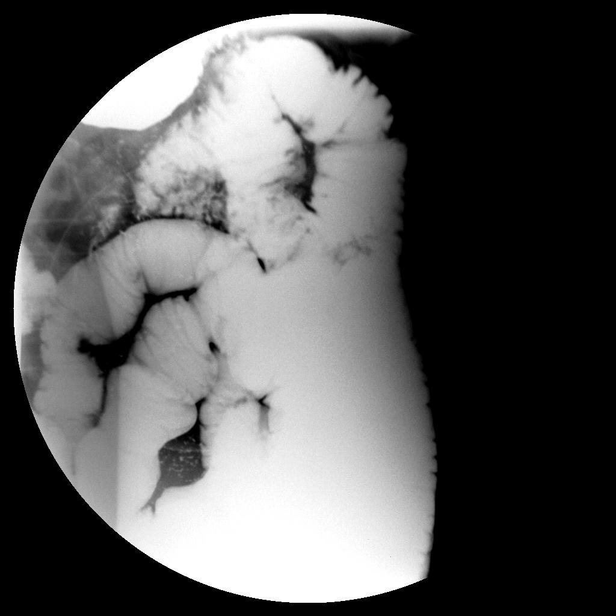
[im 15/25]
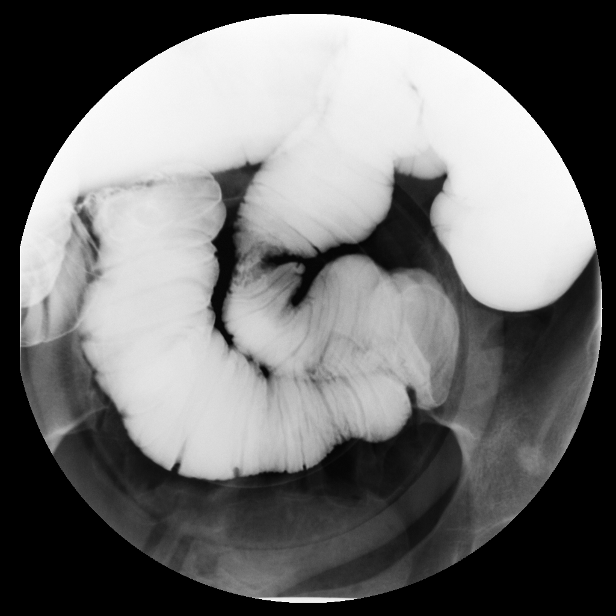
[im 17/25]
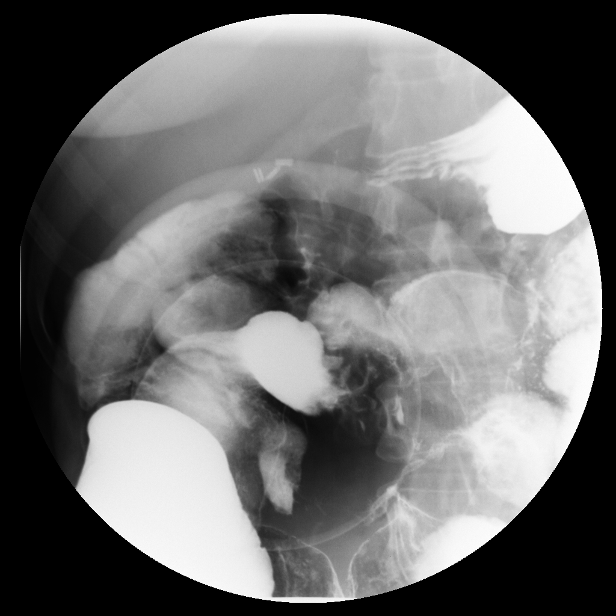
[im 19/25]
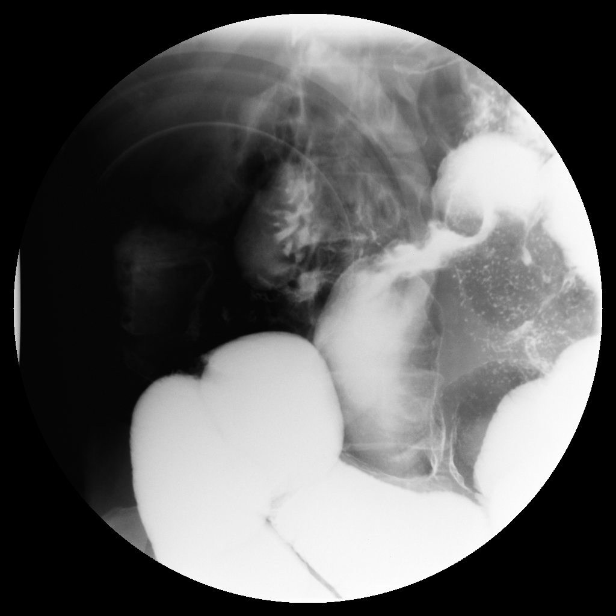
[im 20/25]
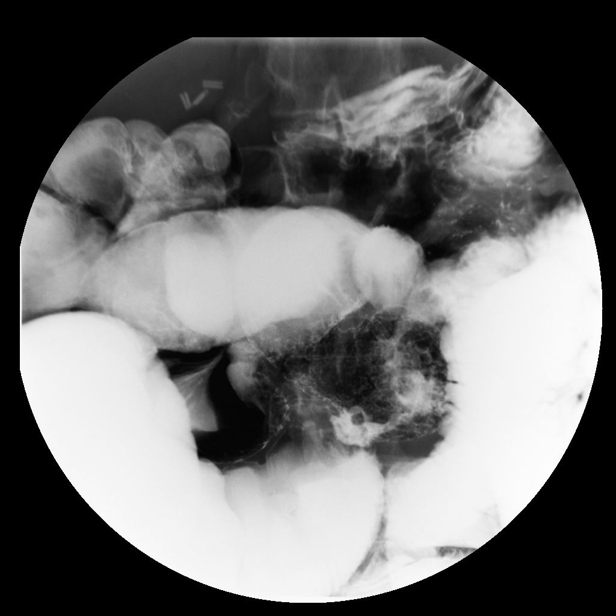
[im 22/25]
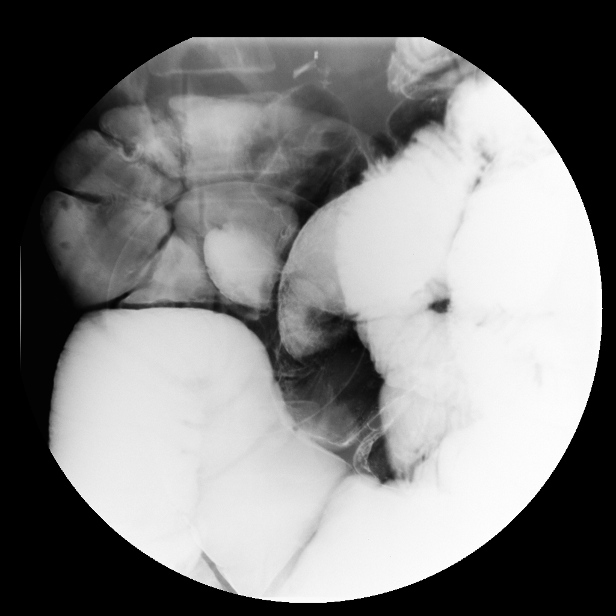
[im 25/25]
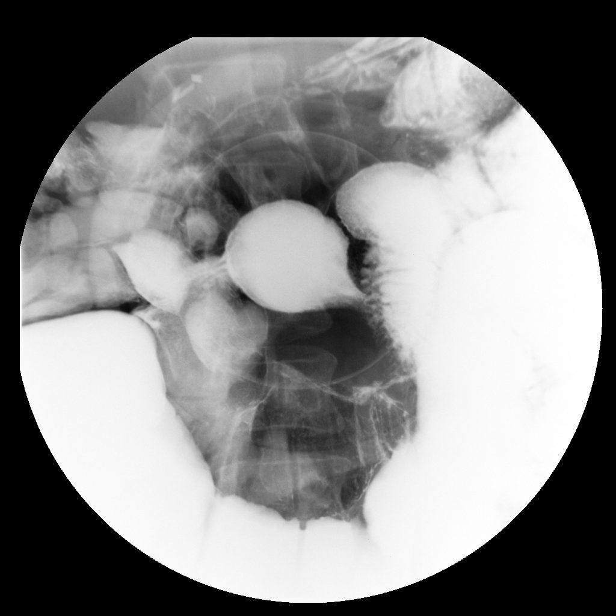

[14 of 24 positions shown; findings below may reference images not displayed]

PROCEDURE:     FL  - FL UPPER GI WITH SMALL BOWEL  - July 10, 2009  [DATE]

RESULT:       The patient was given effervescent crystals and the upper
gastrointestinal tract was evaluated status post administration of oral
barium. This was followed by continued administration of oral barium with
evaluation of the small bowel as the barium column traversed through the
small bowel. Transit time to the terminal ileum is approximately 3.5 hours.

Evaluation of the upper gastrointestinal tract demonstrates no evidence of
filling defects, strictural narrowing, focal outpouching or fusiform
dilatation. The patient demonstrated gastroesophageal reflux to the
midesophagus. There is appropriate relaxation of the lower esophageal
sphincter and no evidence of a hiatal hernia. Evaluation of the duodenum
demonstrates appropriate rotation of the duodenum and placement of the
ligament of Treitz. The stomach demonstrates no radiographic abnormalities.

This was followed by evaluation of the small bowel. As stated above, there
is delayed transit time to the terminal ileum. Within the distal ileum,
multiple dilated loops of contrast-filled small bowel are appreciated.
Within the mid to lower portion of the abdomen on the right, a focal area of
multiple regions of strictural narrowing identified within the small bowel.
Fistulas within this area cannot be excluded. The terminal ileum was
obscured by the multiple distended loops of contrast-filled small bowel. The
remaining loops of small bowel demonstrate no further abnormalities.  Scout
image of the abdomen and pelvis was obtained. Air is seen within nondilated
loops of large small bowel. A sclerotic density projects along the ileum on
the left with intervening trabecular markings likely representing bone
island particularly in the absence of known neoplastic disease.
IMPRESSION: 1. Multiple areas of strictural narrowing within the distal ileum within the
right mid to lower quadrant of the abdomen.
2. Gastroesophageal reflux to the midesophagus.

## 2009-07-10 IMAGING — CR DG UGI W/ SMALL BOWEL
1 series · 12 of 12 positions shown · non-contrast
Comparison: none

REASON FOR EXAM: CHRONS
COMMENTS:

[Series 1: view not recorded · 0.17mm/px · 12 of 12 slices shown]
[im 1/12]
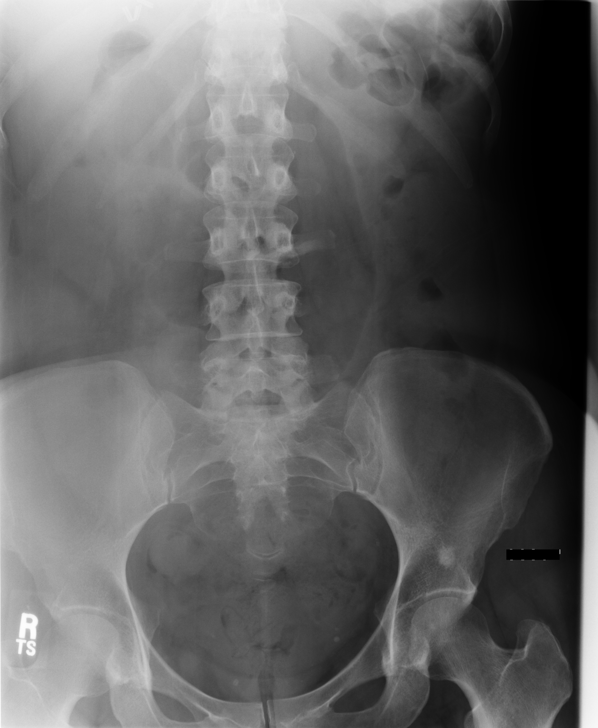
[im 2/12]
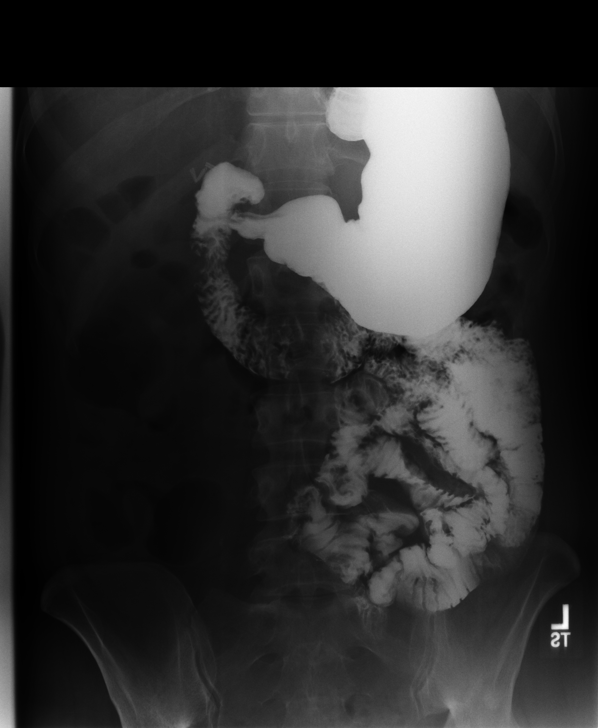
[im 3/12]
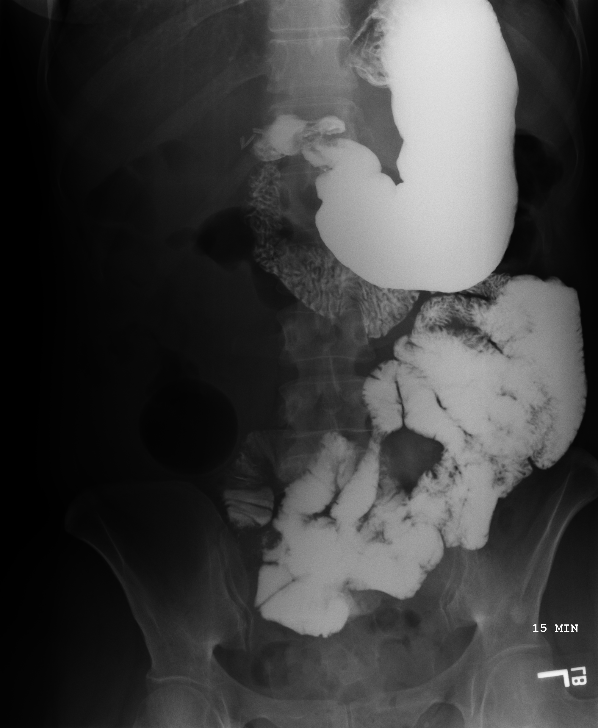
[im 4/12]
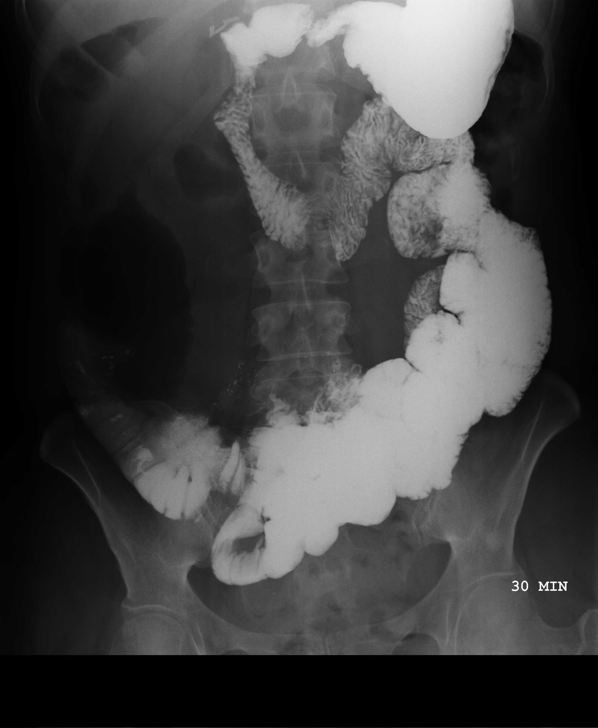
[im 5/12]
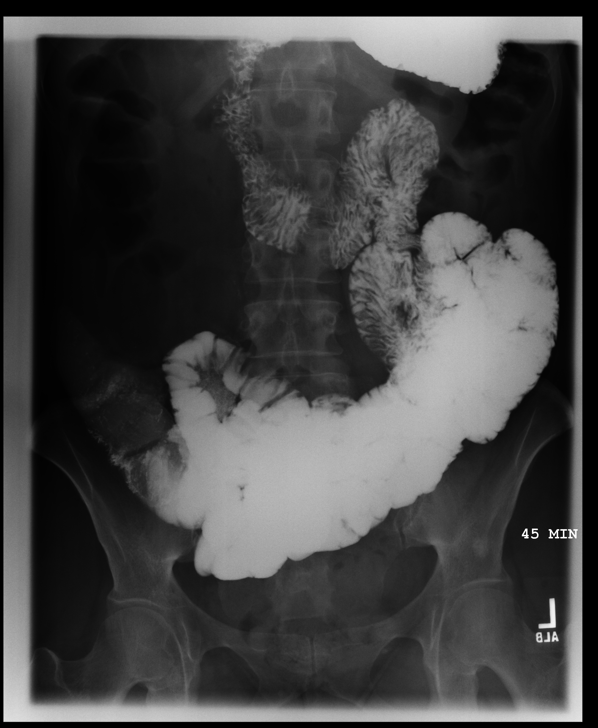
[im 6/12]
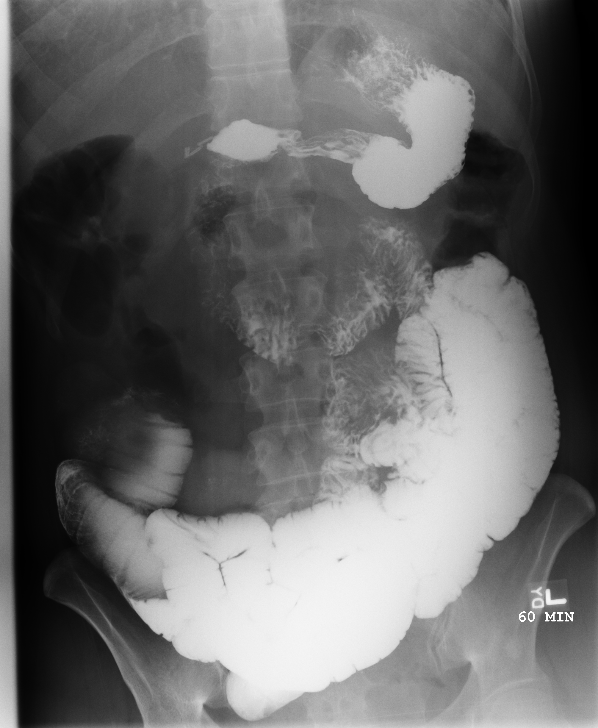
[im 7/12]
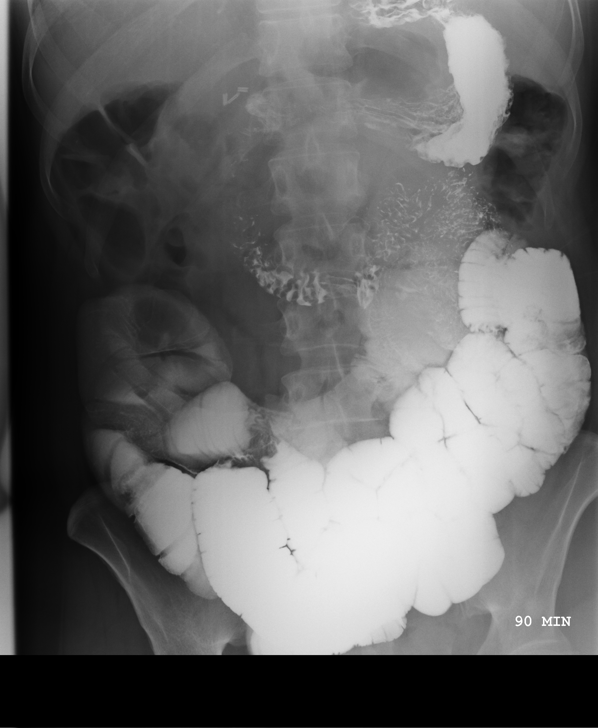
[im 8/12]
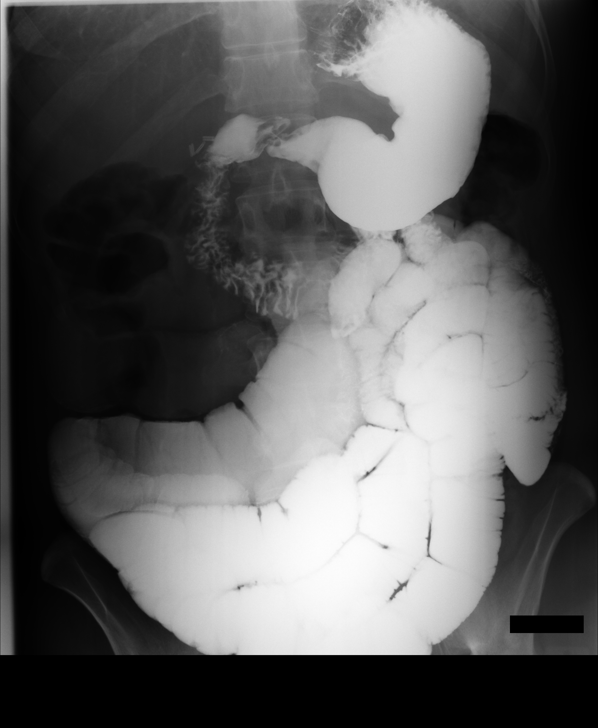
[im 9/12]
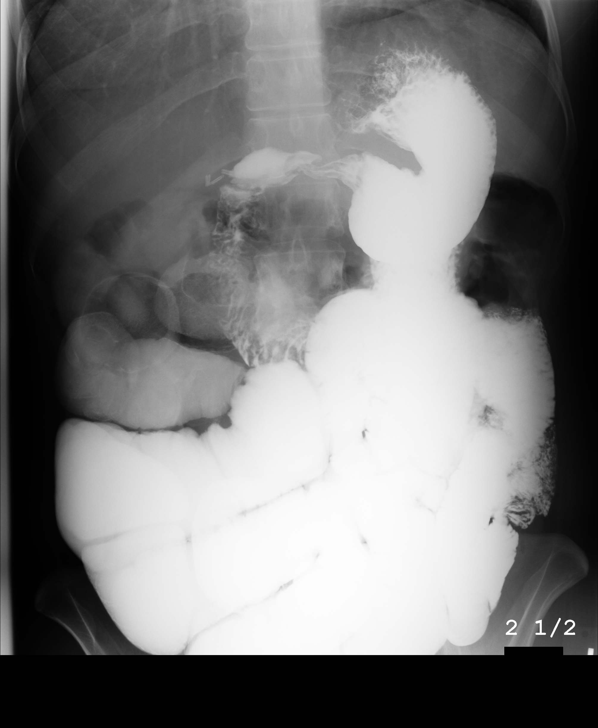
[im 10/12]
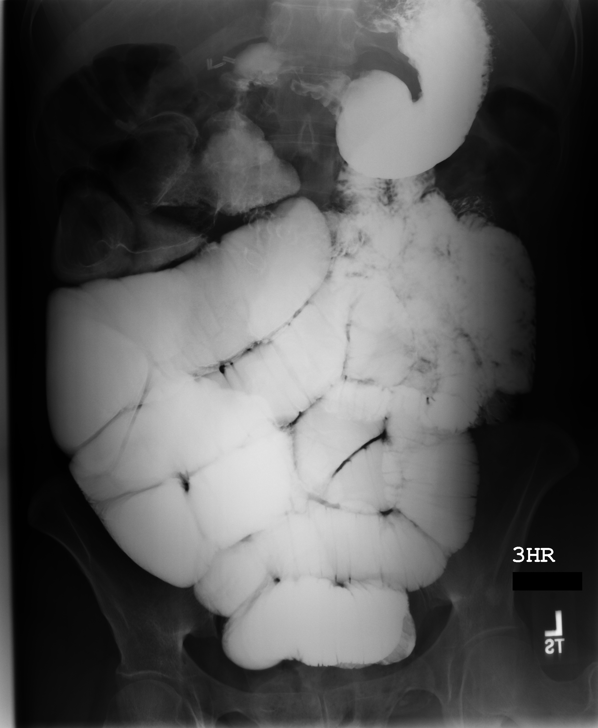
[im 11/12]
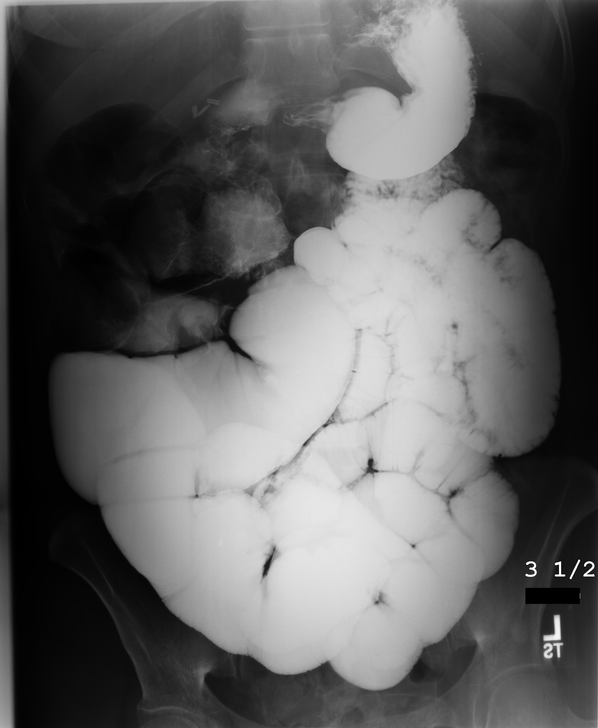
[im 12/12]
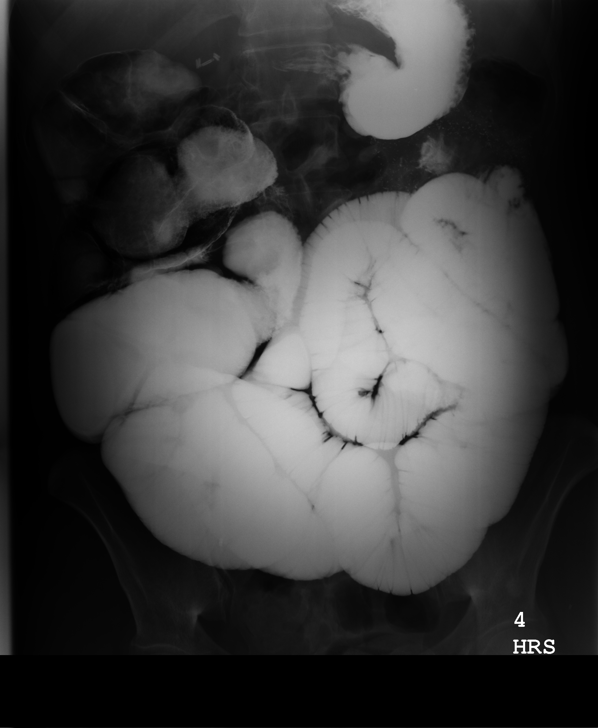

[12 of 12 positions shown; findings below may reference images not displayed]

PROCEDURE:     FL  - FL UPPER GI WITH SMALL BOWEL  - July 10, 2009  [DATE]

RESULT:       The patient was given effervescent crystals and the upper
gastrointestinal tract was evaluated status post administration of oral
barium. This was followed by continued administration of oral barium with
evaluation of the small bowel as the barium column traversed through the
small bowel. Transit time to the terminal ileum is approximately 3.5 hours.

Evaluation of the upper gastrointestinal tract demonstrates no evidence of
filling defects, strictural narrowing, focal outpouching or fusiform
dilatation. The patient demonstrated gastroesophageal reflux to the
midesophagus. There is appropriate relaxation of the lower esophageal
sphincter and no evidence of a hiatal hernia. Evaluation of the duodenum
demonstrates appropriate rotation of the duodenum and placement of the
ligament of Treitz. The stomach demonstrates no radiographic abnormalities.

This was followed by evaluation of the small bowel. As stated above, there
is delayed transit time to the terminal ileum. Within the distal ileum,
multiple dilated loops of contrast-filled small bowel are appreciated.
Within the mid to lower portion of the abdomen on the right, a focal area of
multiple regions of strictural narrowing identified within the small bowel.
Fistulas within this area cannot be excluded. The terminal ileum was
obscured by the multiple distended loops of contrast-filled small bowel. The
remaining loops of small bowel demonstrate no further abnormalities.  Scout
image of the abdomen and pelvis was obtained. Air is seen within nondilated
loops of large small bowel. A sclerotic density projects along the ileum on
the left with intervening trabecular markings likely representing bone
island particularly in the absence of known neoplastic disease.
IMPRESSION: 1. Multiple areas of strictural narrowing within the distal ileum within the
right mid to lower quadrant of the abdomen.
2. Gastroesophageal reflux to the midesophagus.

## 2009-07-12 ENCOUNTER — Encounter: Payer: Self-pay | Admitting: Family Medicine

## 2009-07-15 ENCOUNTER — Ambulatory Visit: Payer: Self-pay | Admitting: General Surgery

## 2009-07-17 ENCOUNTER — Inpatient Hospital Stay: Payer: Self-pay | Admitting: General Surgery

## 2009-07-17 ENCOUNTER — Encounter (INDEPENDENT_AMBULATORY_CARE_PROVIDER_SITE_OTHER): Payer: Self-pay | Admitting: Internal Medicine

## 2009-07-22 ENCOUNTER — Encounter (INDEPENDENT_AMBULATORY_CARE_PROVIDER_SITE_OTHER): Payer: Self-pay | Admitting: Internal Medicine

## 2009-09-07 HISTORY — PX: COLONOSCOPY: SHX174

## 2010-03-11 ENCOUNTER — Encounter: Payer: Self-pay | Admitting: Family Medicine

## 2010-03-11 ENCOUNTER — Ambulatory Visit: Payer: Self-pay | Admitting: Unknown Physician Specialty

## 2010-03-14 LAB — PATHOLOGY REPORT

## 2010-06-03 ENCOUNTER — Encounter: Payer: Self-pay | Admitting: Family Medicine

## 2010-06-11 ENCOUNTER — Ambulatory Visit: Payer: Self-pay | Admitting: General Surgery

## 2010-06-11 IMAGING — MG MAM DGTL SCREENING MAMMO W/CAD
1 series · 4 of 4 positions shown · non-contrast
Comparison: none

REASON FOR EXAM: SCR
COMMENTS:

[R CC · right · 4 of 4 slices shown]
[im 1/4]
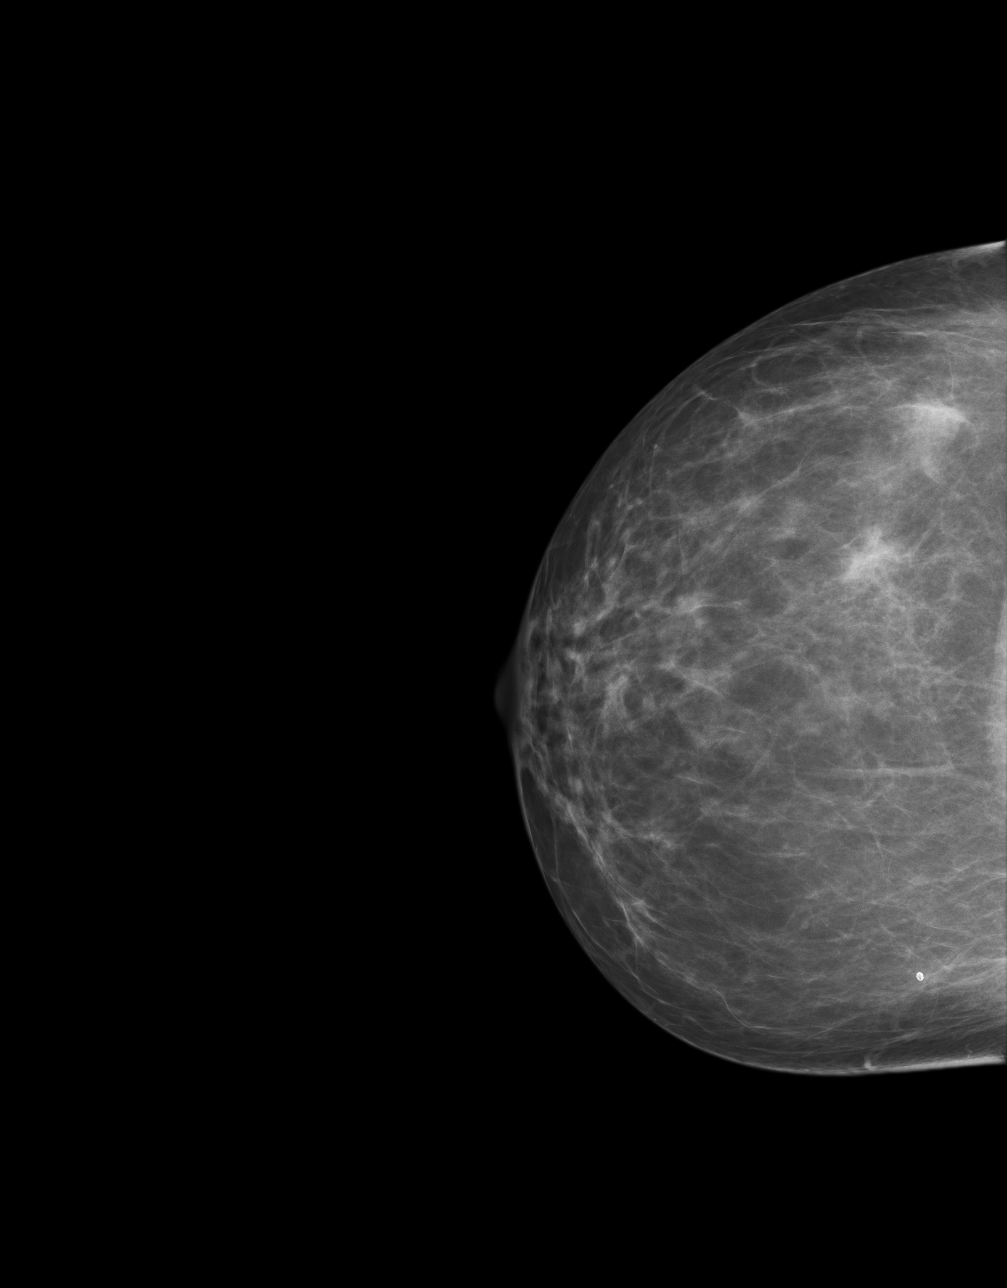
[im 2/4]
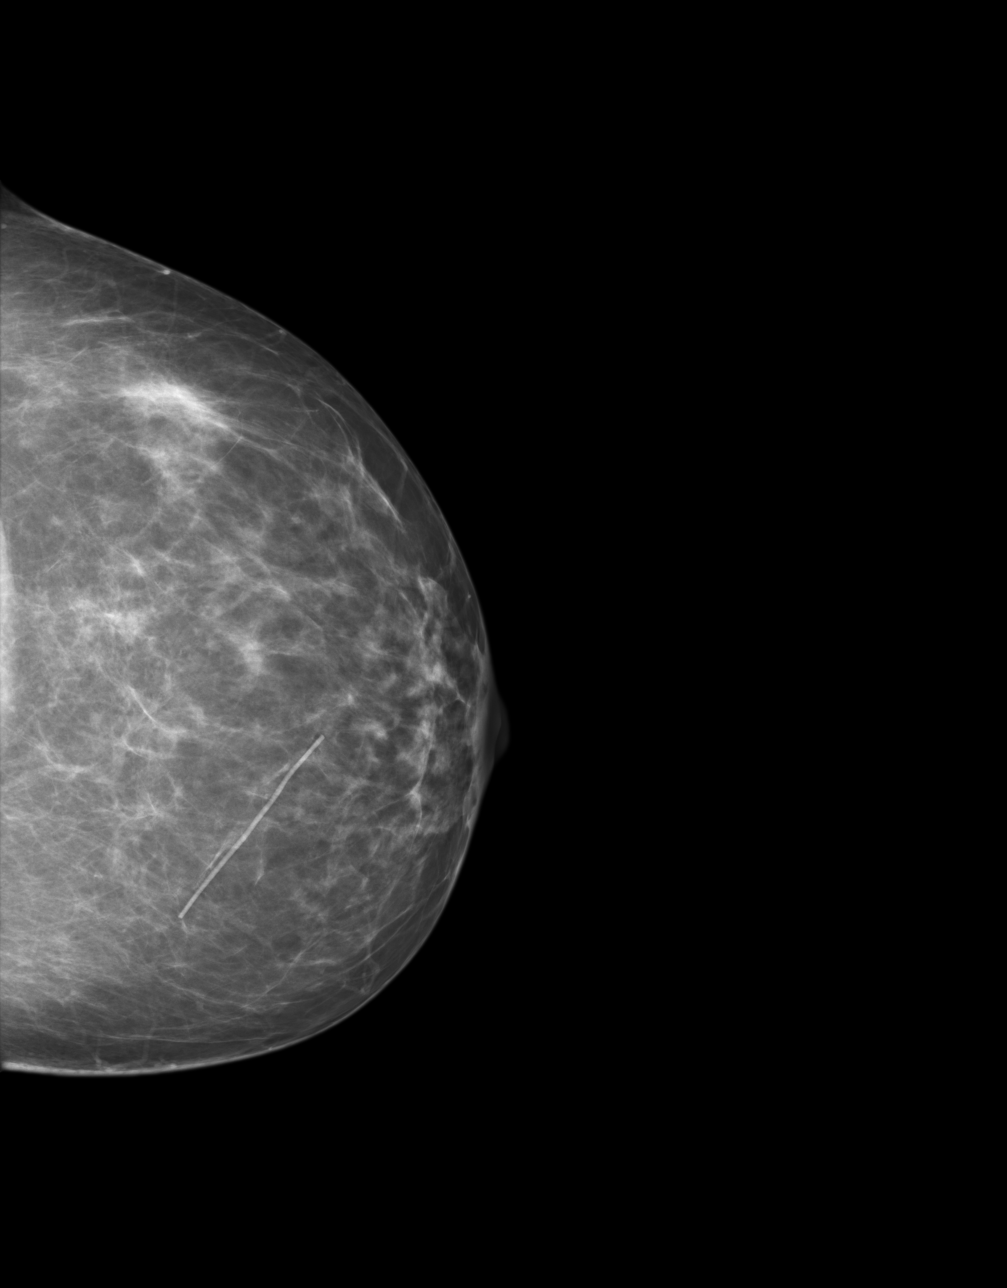
[im 3/4]
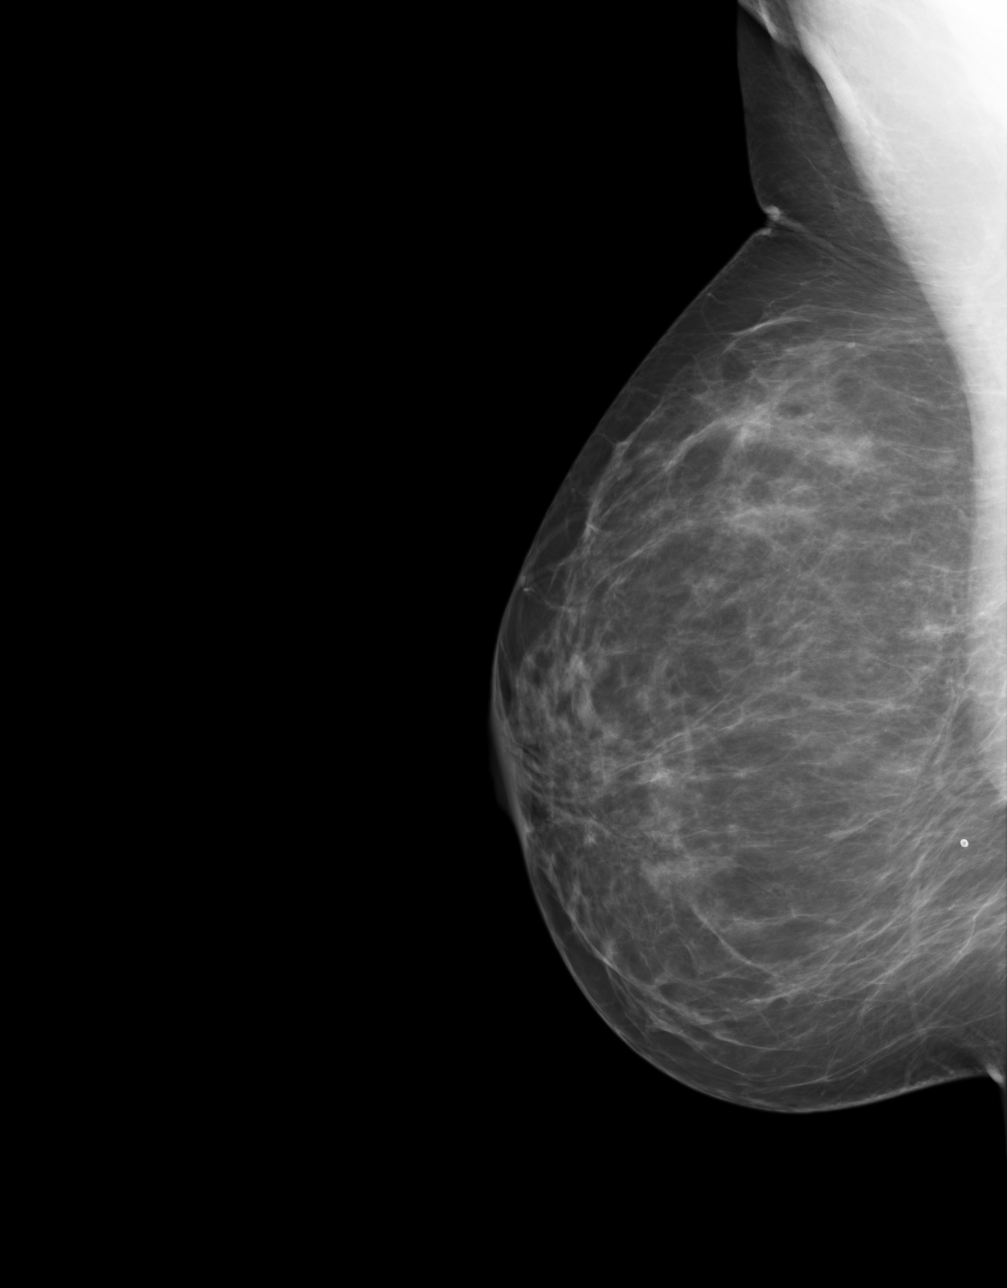
[im 4/4]
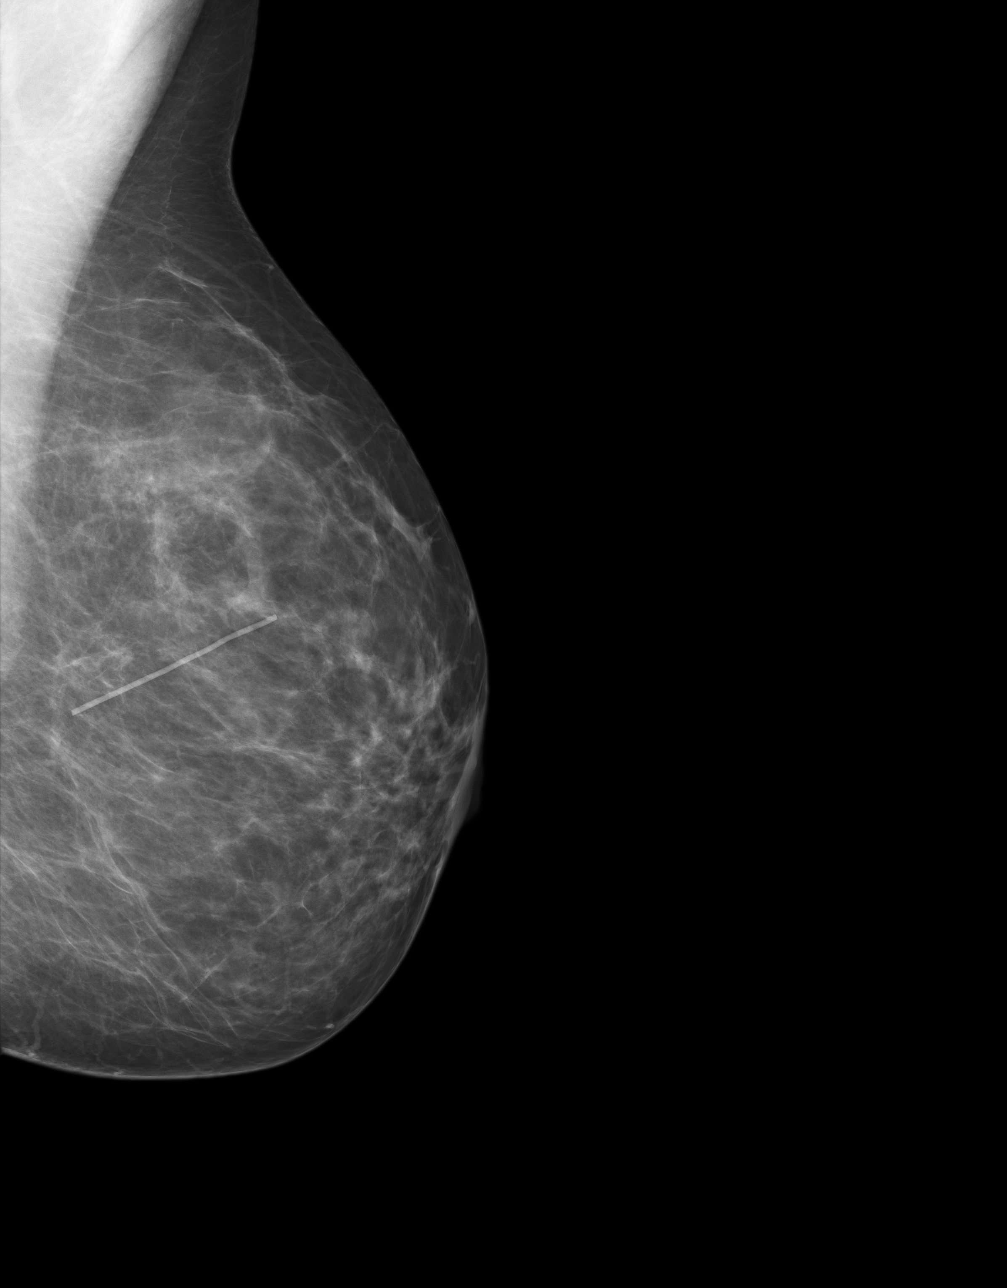

[4 of 4 positions shown; findings below may reference images not displayed]

PROCEDURE:     MAM - MAM DGTL SCREENING MAMMO W/CAD  - June 11, 2010  [DATE]

RESULT:       Comparison is made to a prior digital study dated 05/14/2009,
03/15/2007 and to a film screen study of 03/15/2008.

A surgical scar marker has been placed on the left in the slightly upper
inner aspect of the breast.  Both breasts exhibit a moderately dense
parenchymal pattern.  There is no dominant mass.  I see no
malignant-appearing grouping of microcalcification.
IMPRESSION: 1.     I do not see findings suspicious for developing malignancy or for
recurrent malignancy.
2.     BI-RADS:  Category 2- Benign Finding.

RECOMMENDATION:  Please continue to encourage yearly mammographic follow up.

A negative mammogram report does not preclude biopsy or other evaluation of
a clinically palpable or otherwise suspicious mass or lesion. Breast cancer
may not be detected by mammography in up to 10% of cases.

## 2010-06-18 ENCOUNTER — Encounter: Payer: Self-pay | Admitting: Family Medicine

## 2010-10-07 NOTE — Procedures (Signed)
Summary: Colonoscopy by Dr.Robert [facility]  Colonoscopy by Dr.Robert [facility]   Imported By: Virgia Land 03/14/2010 16:18:57  _____________________________________________________________________  External Attachment:    Type:   Image     Comment:   External Document

## 2010-10-07 NOTE — Letter (Signed)
Summary: [facility]  [facility]   Imported By: Edmonia James 07/07/2010 11:38:34  _____________________________________________________________________  External Attachment:    Type:   Image     Comment:   External Document  Appended Document: [facility]    Clinical Lists Changes  Observations: Added new observation of PAST MED HX: No live virus vaccines while on immuno suppressive meds Crohn's disease per [facility] (07/07/2010 19:57)       Past History:  Past Medical History: No live virus vaccines while on immuno suppressive meds Crohn's disease per [facility] clinic

## 2010-10-07 NOTE — Letter (Signed)
Summary: [facility]  [facility]   Imported By: Laural Benes 06/26/2010 15:41:20  _____________________________________________________________________  External Attachment:    Type:   Image     Comment:   External Document

## 2010-10-24 ENCOUNTER — Encounter: Payer: Self-pay | Admitting: Family Medicine

## 2010-11-18 NOTE — Letter (Signed)
Summary: [facility]   [facility]   Imported By: Jamelle Haring 11/10/2010 11:03:44  _____________________________________________________________________  External Attachment:    Type:   Image     Comment:   External Document  Appended Document: [facility]     Clinical Lists Changes

## 2010-12-15 ENCOUNTER — Encounter: Payer: Self-pay | Admitting: Family Medicine

## 2010-12-16 ENCOUNTER — Encounter: Payer: Self-pay | Admitting: Family Medicine

## 2010-12-17 ENCOUNTER — Encounter: Payer: Self-pay | Admitting: Family Medicine

## 2010-12-17 ENCOUNTER — Ambulatory Visit (INDEPENDENT_AMBULATORY_CARE_PROVIDER_SITE_OTHER): Payer: BLUE CROSS/BLUE SHIELD | Admitting: Family Medicine

## 2010-12-17 ENCOUNTER — Other Ambulatory Visit (HOSPITAL_COMMUNITY)
Admission: RE | Admit: 2010-12-17 | Discharge: 2010-12-17 | Disposition: A | Discharge status: Home / Self Care | Payer: BLUE CROSS/BLUE SHIELD | Source: Ambulatory Visit | Attending: Family Medicine | Admitting: Family Medicine

## 2010-12-17 DIAGNOSIS — D539 Nutritional anemia, unspecified: Secondary | ICD-10-CM

## 2010-12-17 DIAGNOSIS — Z01419 Encounter for gynecological examination (general) (routine) without abnormal findings: Secondary | ICD-10-CM | POA: Insufficient documentation

## 2010-12-17 DIAGNOSIS — Z0000 Encounter for general adult medical examination without abnormal findings: Secondary | ICD-10-CM

## 2010-12-17 DIAGNOSIS — Z1159 Encounter for screening for other viral diseases: Secondary | ICD-10-CM | POA: Insufficient documentation

## 2010-12-17 DIAGNOSIS — R7309 Other abnormal glucose: Secondary | ICD-10-CM

## 2010-12-17 LAB — BASIC METABOLIC PANEL
BUN: 10 mg/dL (ref 6–23)
CO2: 28 mEq/L (ref 19–32)
Calcium: 9 mg/dL (ref 8.4–10.5)
Creatinine, Ser: 0.8 mg/dL (ref 0.4–1.2)
Glucose, Bld: 103 mg/dL — ABNORMAL HIGH (ref 70–99)

## 2010-12-17 LAB — CBC WITH DIFFERENTIAL/PLATELET
Basophils Absolute: 0 10*3/uL (ref 0.0–0.1)
Eosinophils Absolute: 0.2 10*3/uL (ref 0.0–0.7)
Lymphocytes Relative: 20.1 % (ref 12.0–46.0)
MCHC: 34.5 g/dL (ref 30.0–36.0)
Neutrophils Relative %: 68.9 % (ref 43.0–77.0)
Platelets: 261 10*3/uL (ref 150.0–400.0)
RBC: 4.59 Mil/uL (ref 3.87–5.11)
RDW: 13.3 % (ref 11.5–14.6)

## 2010-12-17 LAB — LDL CHOLESTEROL, DIRECT: Direct LDL: 109.1 mg/dL

## 2010-12-17 NOTE — Assessment & Plan Note (Signed)
Reviewed preventive care protocols, scheduled due services, and updated immunizations Discussed nutrition, exercise, diet, and healthy lifestyle.  

## 2010-12-17 NOTE — Progress Notes (Signed)
45 yo new to me with h/o Chron's disease here for CPX.  S/p bowel resection 07/2009 and feeling much better. Now only taking Imuran for her Crohn's and has not had any flares. Followed by Dr. Vira Agar at [facility].   The PMH, PSH, Social History, Family History, Medications, and allergies have been reviewed in [facility], and have been updated if relevant.  General: Denies fever, chills, sweats. No significant weight loss. Eyes: Denies blurring,significant itching ENT: Denies earache, sore throat, and hoarseness.  Cardiovascular: Denies chest pains, palpitations, dyspnea on exertion,  Respiratory: Denies cough, dyspnea at rest,wheeezing Breast: no concerns about lumps GI: Denies nausea, vomiting, diarrhea, constipation, change in bowel habits, abdominal pain, melena, hematochezia GU: Denies dysuria, hematuria, urinary hesitancy, nocturia, denies STD risk, no concerns about discharge Musculoskeletal: Denies back pain, joint pain Derm: Denies rash, itching Neuro: Denies  paresthesias, frequent falls, frequent headaches Psych: Denies depression, anxiety Endocrine: Denies cold intolerance, heat intolerance, polydipsia Heme: Denies enlarged lymph nodes Allergy: No hayfever  Physical exam: BP 120/82  Pulse 70  Temp(Src) 98 F (36.7 C) (Oral)  Ht 5' 5"  (1.651 m)  Wt 159 lb 1.9 oz (72.176 kg)  BMI 26.48 kg/m2  LMP 11/25/2010  General:  Well-developed,well-nourished,in no acute distress; alert,appropriate and cooperative throughout examination Head:  normocephalic and atraumatic.   Eyes:  vision grossly intact, pupils equal, pupils round, and pupils reactive to light.   Ears:  R ear normal and L ear normal.   Nose:  no external deformity.   Mouth:  good dentition.   Neck:  No deformities, masses, or tenderness noted. Breasts:  No mass, nodules, thickening, tenderness, bulging, retraction, inflamation, nipple discharge or skin changes noted.   Lungs:  Normal respiratory effort, chest  expands symmetrically. Lungs are clear to auscultation, no crackles or wheezes. Heart:  Normal rate and regular rhythm. S1 and S2 normal without gallop, murmur, click, rub or other extra sounds. Abdomen:  Bowel sounds positive,abdomen soft and non-tender without masses, organomegaly or hernias noted. Rectal:  no external abnormalities.   Genitalia:  Pelvic Exam:        External: normal female genitalia without lesions or masses        Vagina: normal without lesions or masses        Cervix: normal without lesions or masses        Adnexa: normal bimanual exam without masses or fullness        Uterus: normal by palpation        Pap smear: performed Msk:  No deformity or scoliosis noted of thoracic or lumbar spine.   Extremities:  No clubbing, cyanosis, edema, or deformity noted with normal full range of motion of all joints.   Neurologic:  alert & oriented X3 and gait normal.   Skin:  Intact without suspicious lesions or rashes Cervical Nodes:  No lymphadenopathy noted Axillary Nodes:  No palpable lymphadenopathy Psych:  Cognition and judgment appear intact. Alert and cooperative with normal attention span and concentration. No apparent delusions, illusions, hallucinations

## 2010-12-24 ENCOUNTER — Encounter: Payer: Self-pay | Admitting: *Deleted

## 2011-05-19 ENCOUNTER — Encounter: Payer: Self-pay | Admitting: Family Medicine

## 2011-05-19 ENCOUNTER — Ambulatory Visit (INDEPENDENT_AMBULATORY_CARE_PROVIDER_SITE_OTHER): Payer: BLUE CROSS/BLUE SHIELD | Admitting: Family Medicine

## 2011-05-19 VITALS — BP 108/66 | HR 72 | Temp 98.6°F | Wt 163.2 lb

## 2011-05-19 DIAGNOSIS — B354 Tinea corporis: Secondary | ICD-10-CM

## 2011-05-19 LAB — POCT SKIN KOH: Skin KOH, POC: POSITIVE

## 2011-05-19 MED ORDER — CLOTRIMAZOLE 1 % EX CREA: TOPICAL_CREAM | Freq: Two times a day (BID) | CUTANEOUS | Status: AC

## 2011-05-19 NOTE — Progress Notes (Signed)
  Subjective:    Patient ID: Chelsea Hicks, female    DOB: 29-Aug-1966, 45 y.o.   MRN: [id number]  HPI CC: skin rash  45yo with h/o crohn's on imuran presents with 2 1/2 wks of skin rash on left forearm.  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  Has been using lamisil for the last week, not really improving.  Itchy.  No other rash anywhere.  Keeping bandaid on it at night.  No fevers/chills.  Has 2 dogs at home.  No one else sick at home.  Review of Systems Per HPI    Objective:   Physical Exam AFVSS Left anterior forearm with 3cm circular scaly erythematous rash with central clearing No other rash.     Assessment & Plan:

## 2011-05-19 NOTE — Patient Instructions (Signed)
Looks like ringworm Use clotrimazole cream twice daily for 4 weeks. Let us know if not improving as expected.  Ringworm - Body (Tinea Corporis) Ringworm is a fungal infection of the skin and hair. Another name for this problem is Tinea Corporis. It has nothing to do with worms. A fungus is an organism that lives on dead cells (the outer layer of skin). It can involve the entire body. It can spread from infected pets. Tinea corporis can be a problem in wrestlers who may get the infection form other players/opponents, equipment and mats. DIAGNOSIS A skin scraping can be obtained from the affected area and by looking for fungus under the microscope. This is called a KOH examination.  HOME CARE INSTRUCTIONS  Ringworm may be treated with a topical antifungal cream, ointment, or oral medications.   If you are using a cream or ointment, wash infected skin. Dry it completely before application.   Scrub the skin with a buff puff or abrasive sponge using a shampoo with ketoconazole to remove dead skin and help treat the ringworm.   Have your pet treated by your veterinarian if it has the same infection.  SEEK MEDICAL CARE IF:  The ringworm patch (fungus) continues to spread after 7 days of treatment.   The rash is not gone in 4 weeks. Fungal infections are slow to respond to treatment. Some redness (erythema) may remain for several weeks after the fungus is gone.   The area becomes red, warm, tender, and swollen beyond the patch. This may be a secondary bacterial (germ) infection.   An oral temperature above 101 develops.  Document Released: 08/21/2000 Document Re-Released: 06/21/2009 [facility] Patient Information 2011 [place].

## 2011-05-19 NOTE — Assessment & Plan Note (Addendum)
KOH today positive. Treat with clotrimazole bid x 4 wks.  Update if not improving as expected. On imuran, may need oral antifungal if fails topical treatment.

## 2011-06-15 ENCOUNTER — Ambulatory Visit: Payer: Self-pay | Admitting: General Surgery

## 2011-06-15 IMAGING — MG MM CAD SCREENING MAMMO
1 series · 4 of 4 positions shown · non-contrast
Comparison: none

REASON FOR EXAM: SCR MAMMO
COMMENTS:

[Series 4234: R CC · right · 4 of 4 slices shown]
[im 1/4]
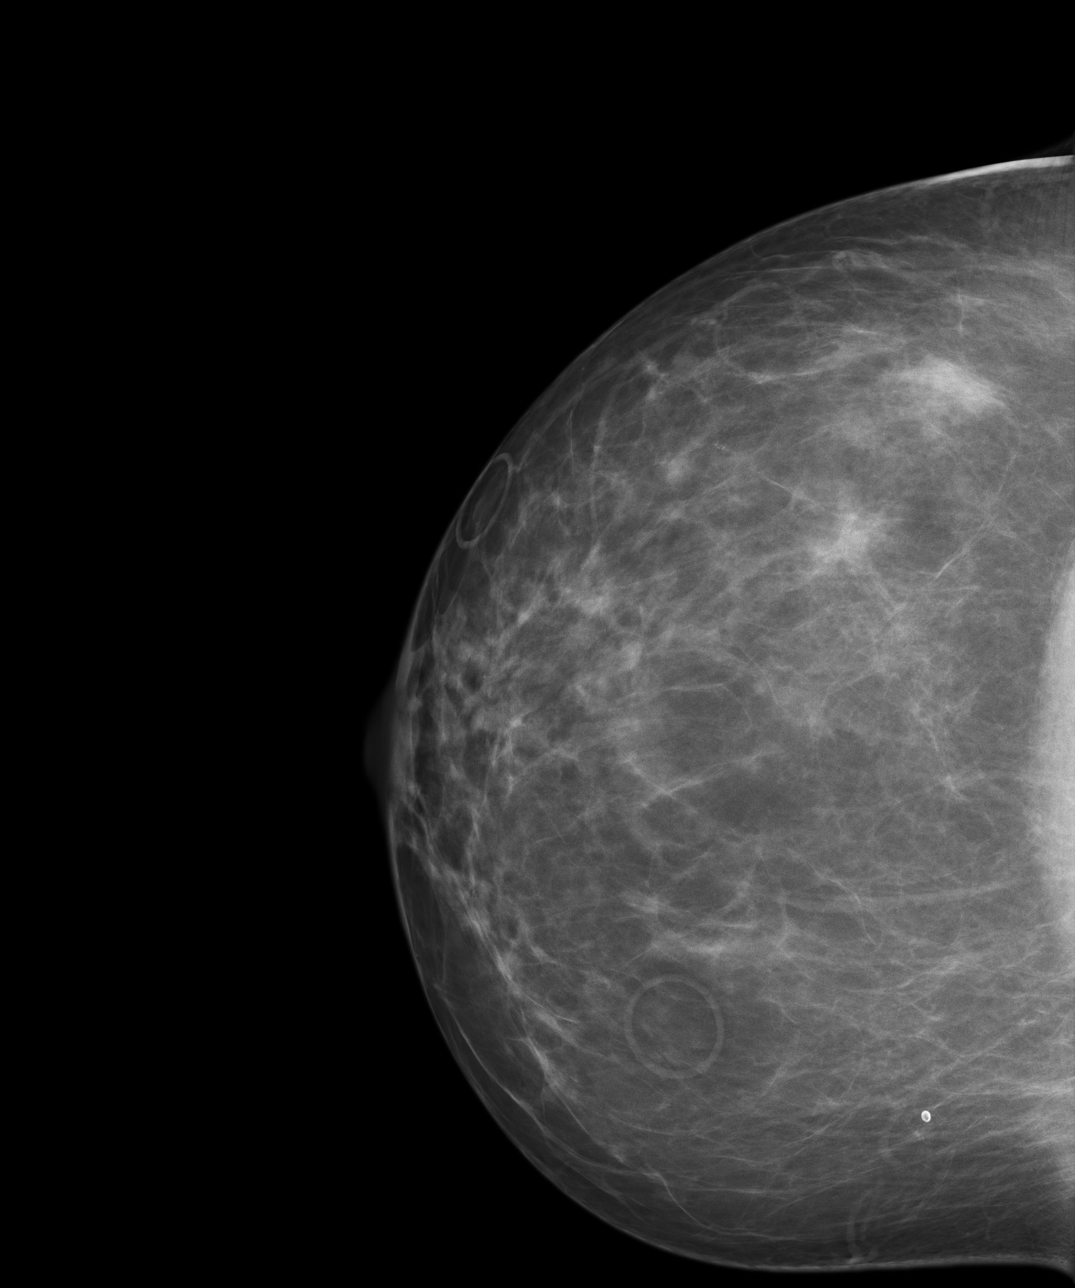
[im 2/4]
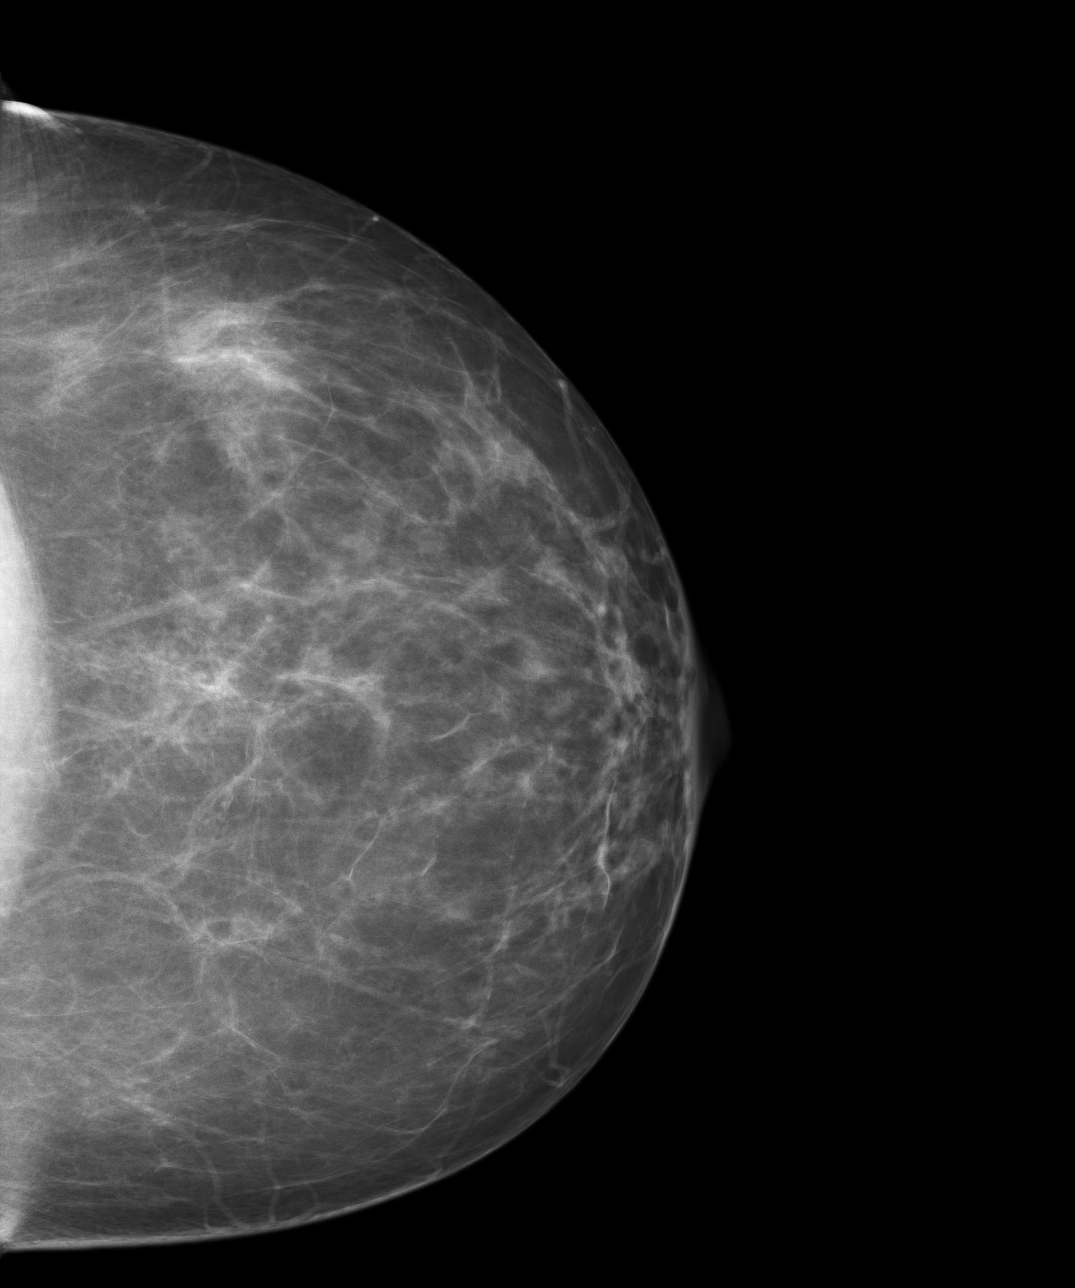
[im 3/4]
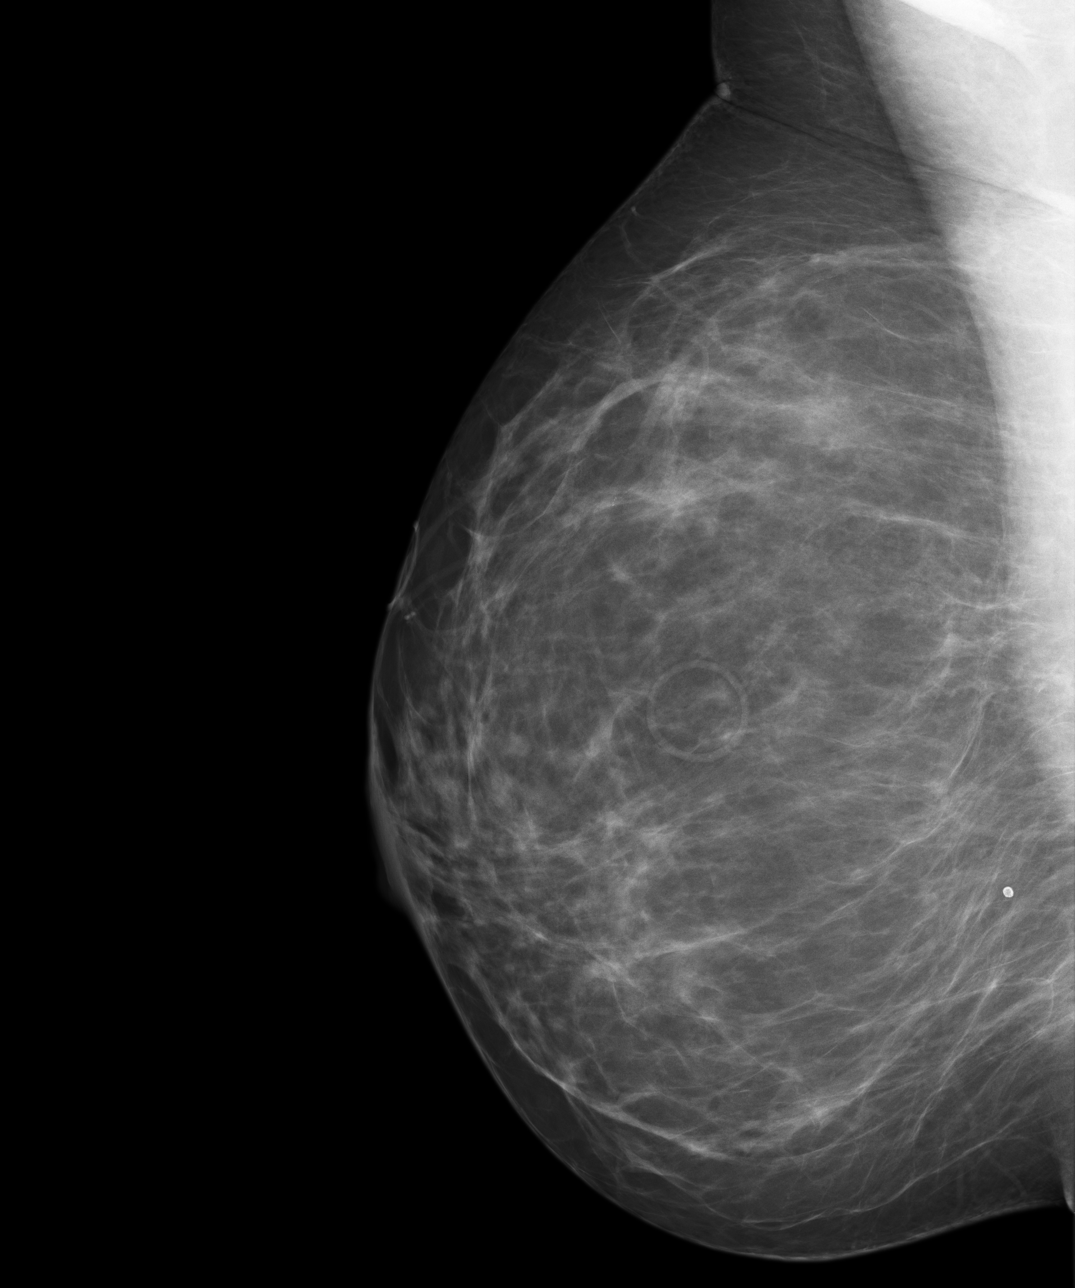
[im 4/4]
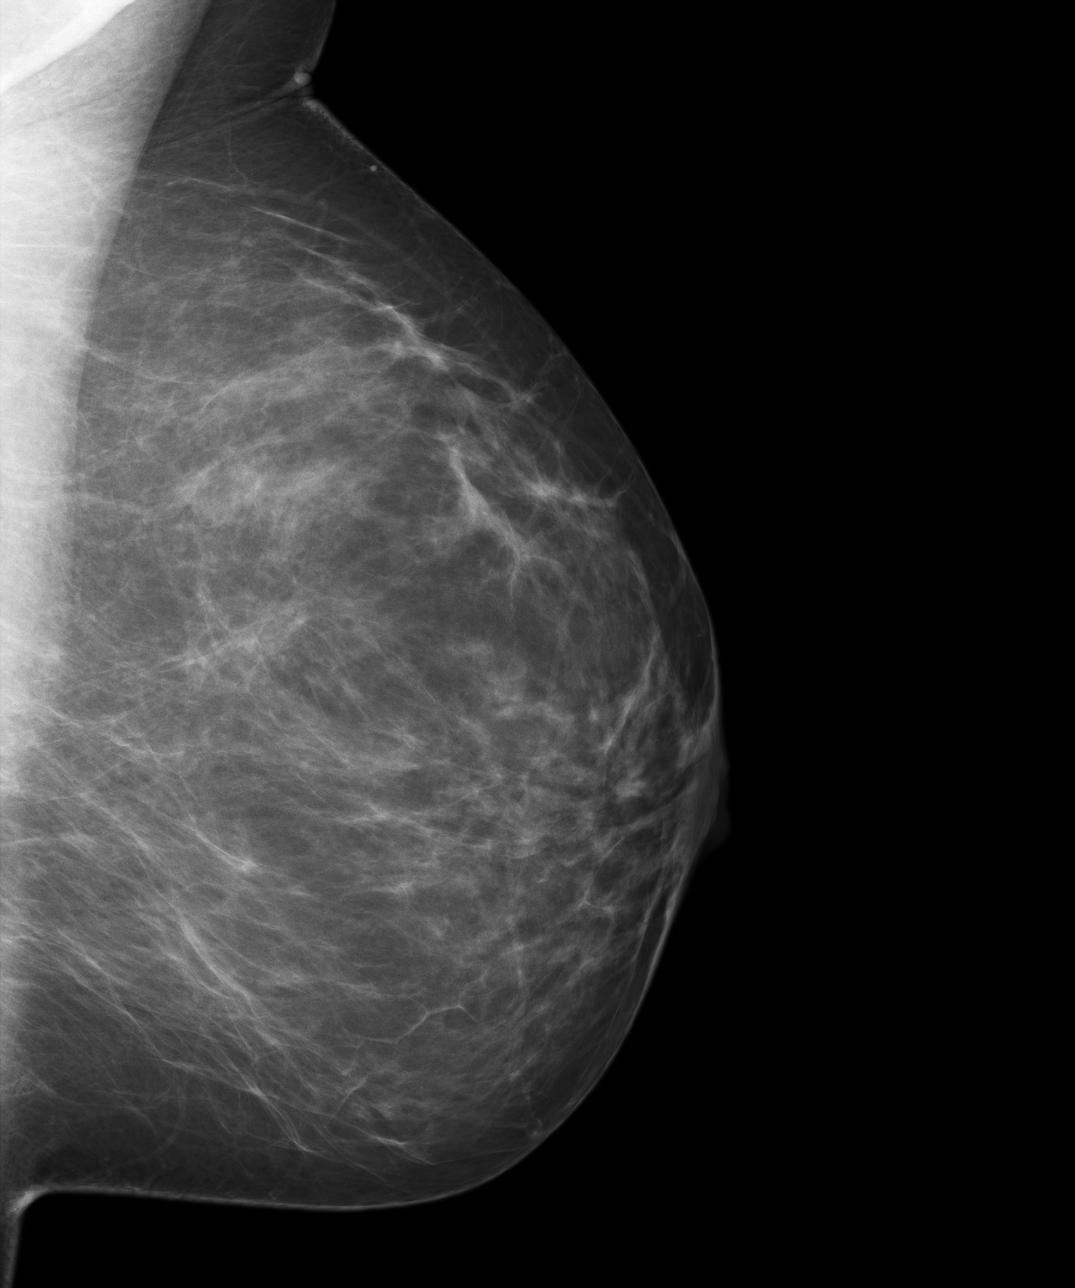

[4 of 4 positions shown; findings below may reference images not displayed]

PROCEDURE:     MAM - MAM DGTL SCREENING MAMMO W/CAD  - June 15, 2011  [DATE]

RESULT:      Comparison is made to prior studies dated 03/15/2008 and
06/11/2010.
The breasts demonstrate a heterogenous parenchymal pattern.  When compared
to previous studies there has been no radiographic evidence to suggest
malignancy.
IMPRESSION: BI-RADS:  Category 2- Benign Finding.

A negative mammogram report does not preclude biopsy or other evaluation of
a clinically palpable or otherwise suspicious mass or lesion. Breast cancer
may not be detected by mammography in up to 10% of cases.

## 2011-06-18 ENCOUNTER — Other Ambulatory Visit: Payer: Self-pay | Admitting: Unknown Physician Specialty

## 2011-09-08 DIAGNOSIS — N6019 Diffuse cystic mastopathy of unspecified breast: Secondary | ICD-10-CM

## 2011-09-08 DIAGNOSIS — D2360 Other benign neoplasm of skin of unspecified upper limb, including shoulder: Secondary | ICD-10-CM

## 2011-09-08 HISTORY — DX: Diffuse cystic mastopathy of unspecified breast: N60.19

## 2011-09-08 HISTORY — DX: Other benign neoplasm of skin of unspecified upper limb, including shoulder: D23.60

## 2012-06-15 ENCOUNTER — Ambulatory Visit: Payer: Self-pay | Admitting: General Surgery

## 2012-06-15 IMAGING — MG MM CAD SCREENING MAMMO
1 series · 5 of 5 positions shown · non-contrast
Comparison: none

REASON FOR EXAM: SCR MAMMO NO ORDER
COMMENTS:

PROCEDURE:     MAM - MAM DGTL SCRN MAM NO ORDER W/CAD  - June 15, 2012  [DATE]
RESULT:     Breast are moderately dense and nodular. CAD evaluation is
nonfocal.Benign calcification present. Exam stable from prior exams.

[R CC · right · 5 of 5 slices shown]
[im 1/5]
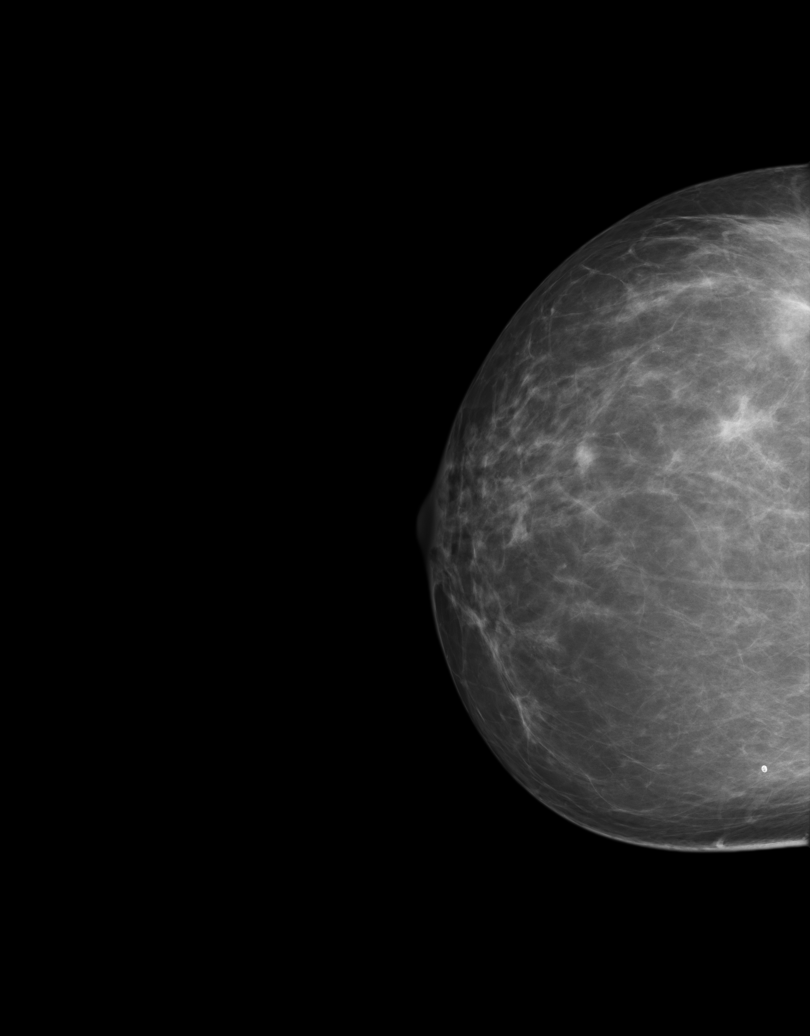
[im 2/5]
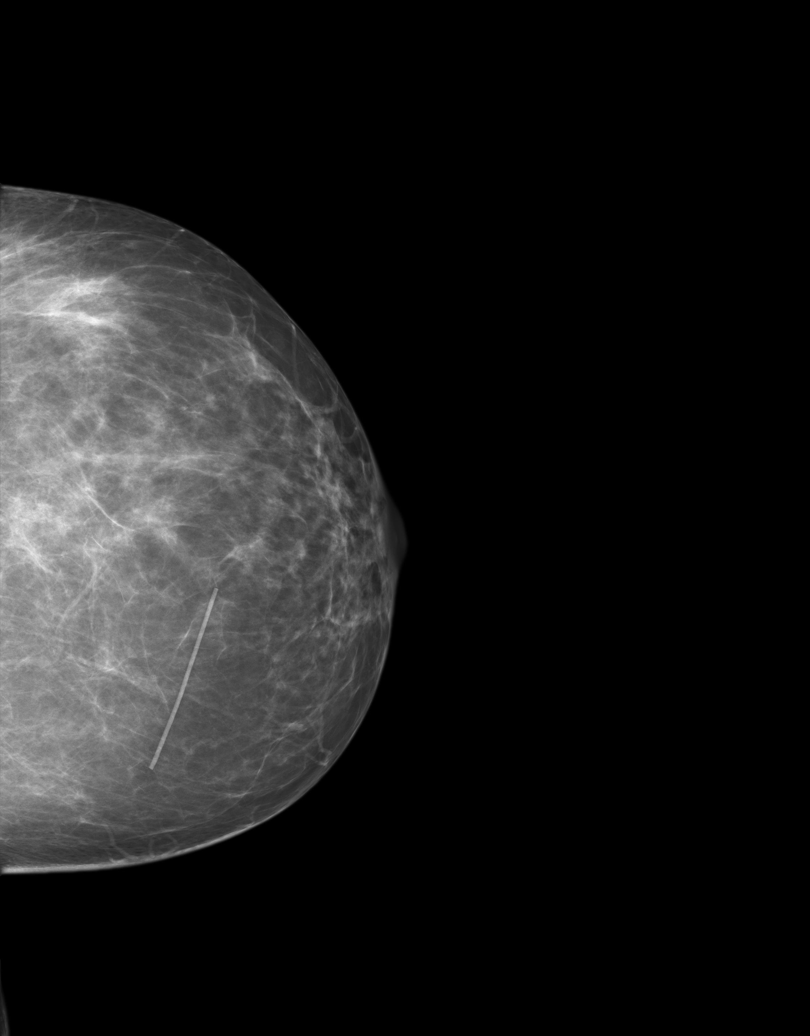
[im 3/5]
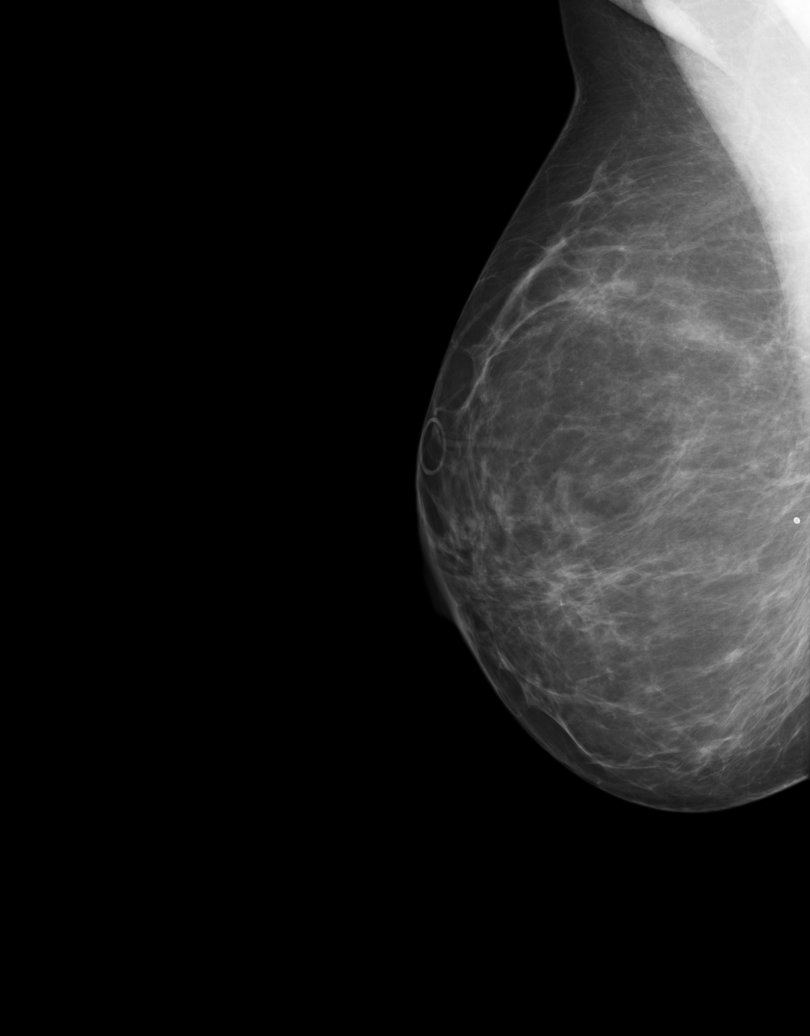
[im 4/5]
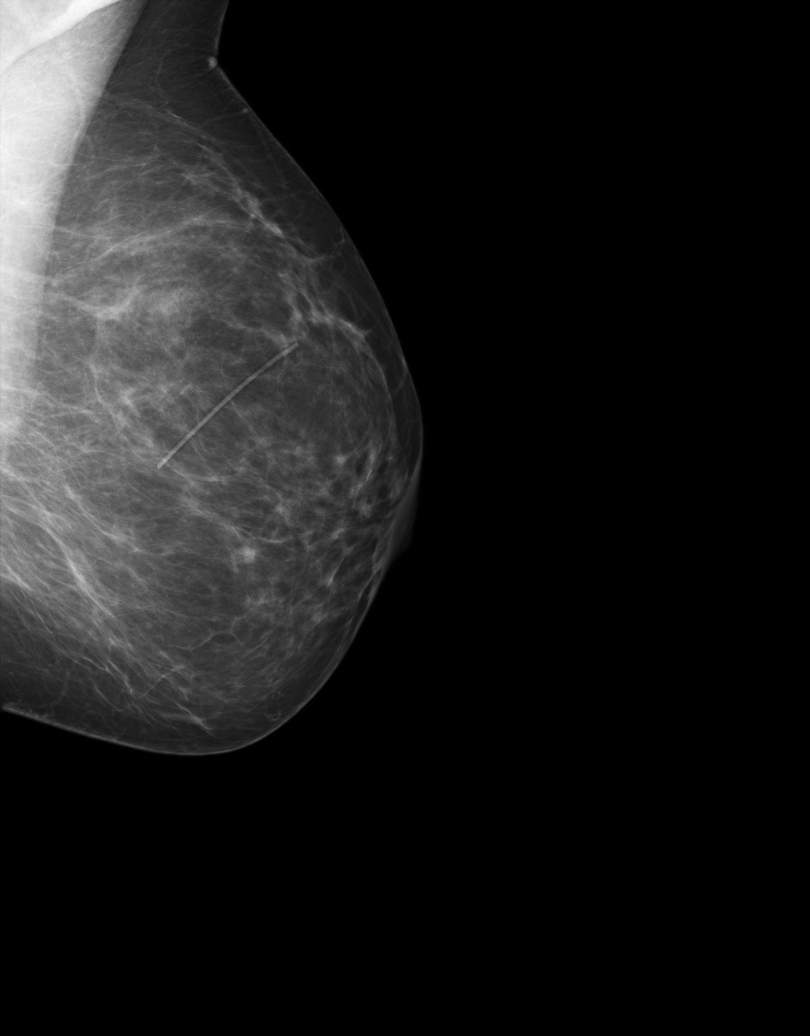
[im 5/5]
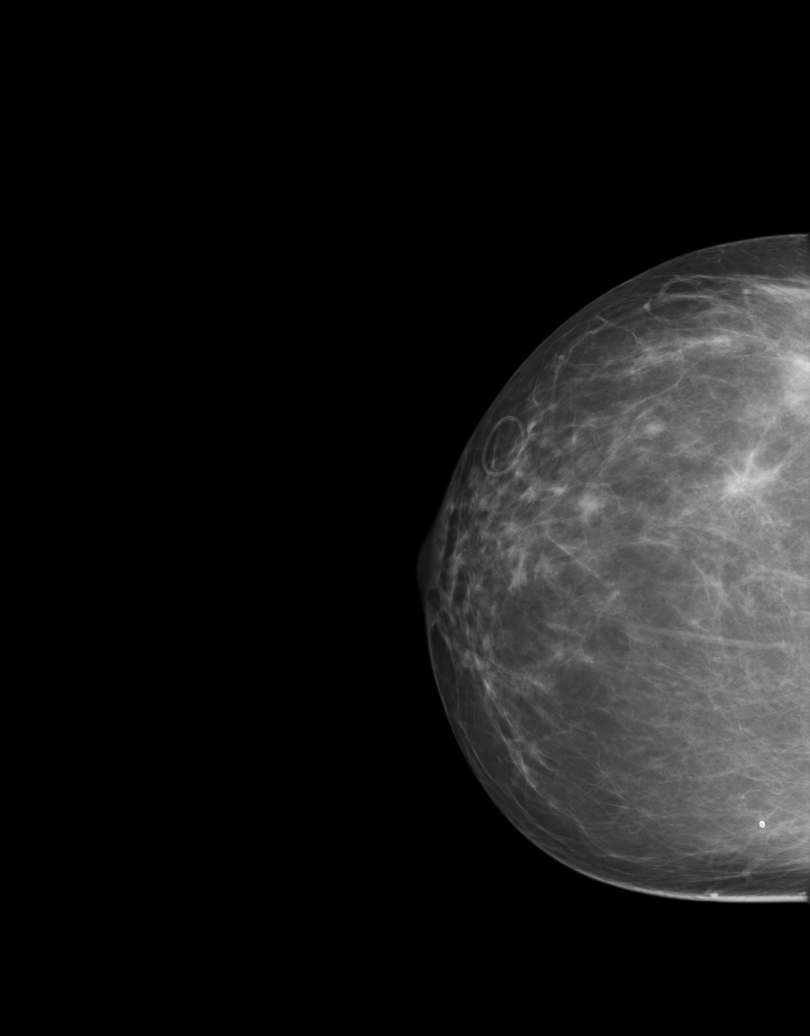

[5 of 5 positions shown; findings below may reference images not displayed]

IMPRESSION: Benign exam. Yearly followup exam suggested.

BI-RADS: Category 2- Benign Finding

A NEGATIVE MAMMOGRAM REPORT DOES NOT PRECLUDE BIOPSY OR OTHER EVALUATION OF
A CLINICALLY PALPABLE OR OTHERWISE SUSPICIOUS MASS OR LESION. BREAST CANCER
MAY NOT BE DETECTED IN UP TO 10% OF CASES.

## 2012-07-18 HISTORY — PX: LIPOMA EXCISION: SHX5283

## 2013-02-07 ENCOUNTER — Encounter: Payer: Self-pay | Admitting: *Deleted

## 2013-06-09 ENCOUNTER — Ambulatory Visit: Payer: Self-pay | Admitting: General Surgery

## 2013-06-09 IMAGING — MG MM DIGITAL SCREENING BILAT W/ CAD
3 series · 6 of 6 positions shown · non-contrast
Comparison: Previous exam(s).

REASON FOR EXAM: SCR MAMMO NO ORDER
COMMENTS:

PROCEDURE:     MAM - MAM DGTL SCRN MAM NO ORDER W/CAD  - June 09, 2013  [DATE]
CLINICAL DATA: Screening.
DIGITAL SCREENING BILATERAL MAMMOGRAM WITH CAD

[R CC · right · 4 of 4 slices shown (1 of 2)]
[im 1/4]
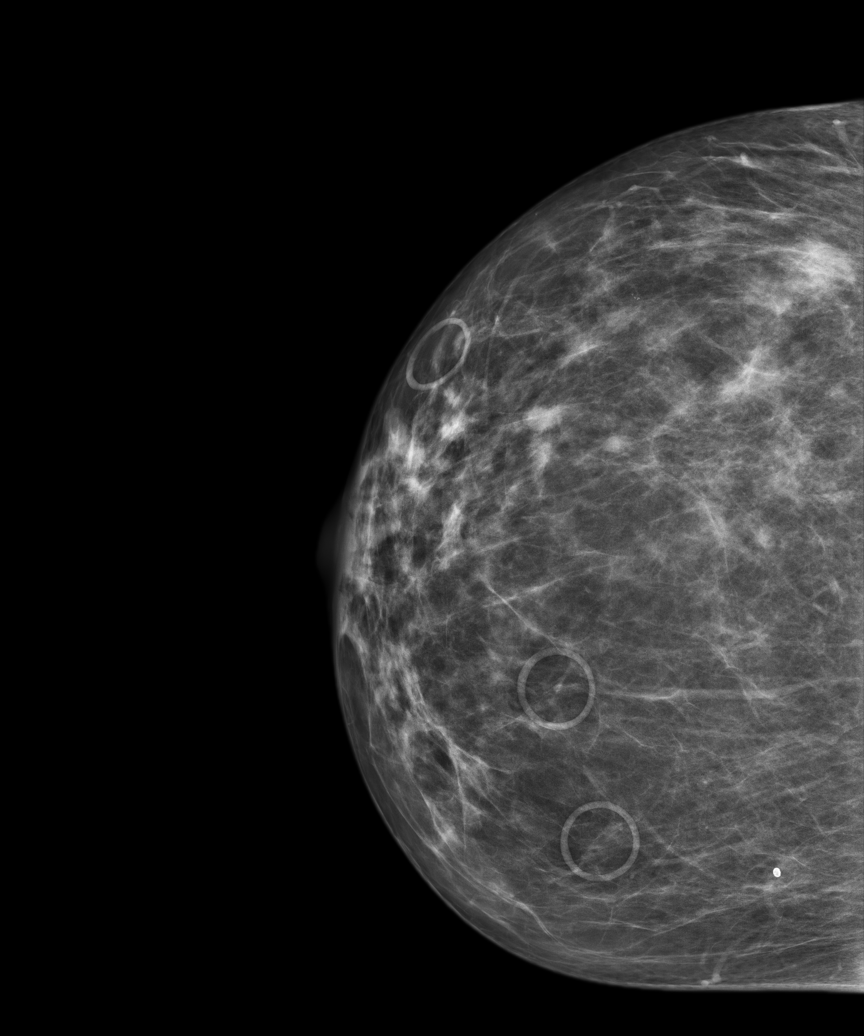
[im 2/4]
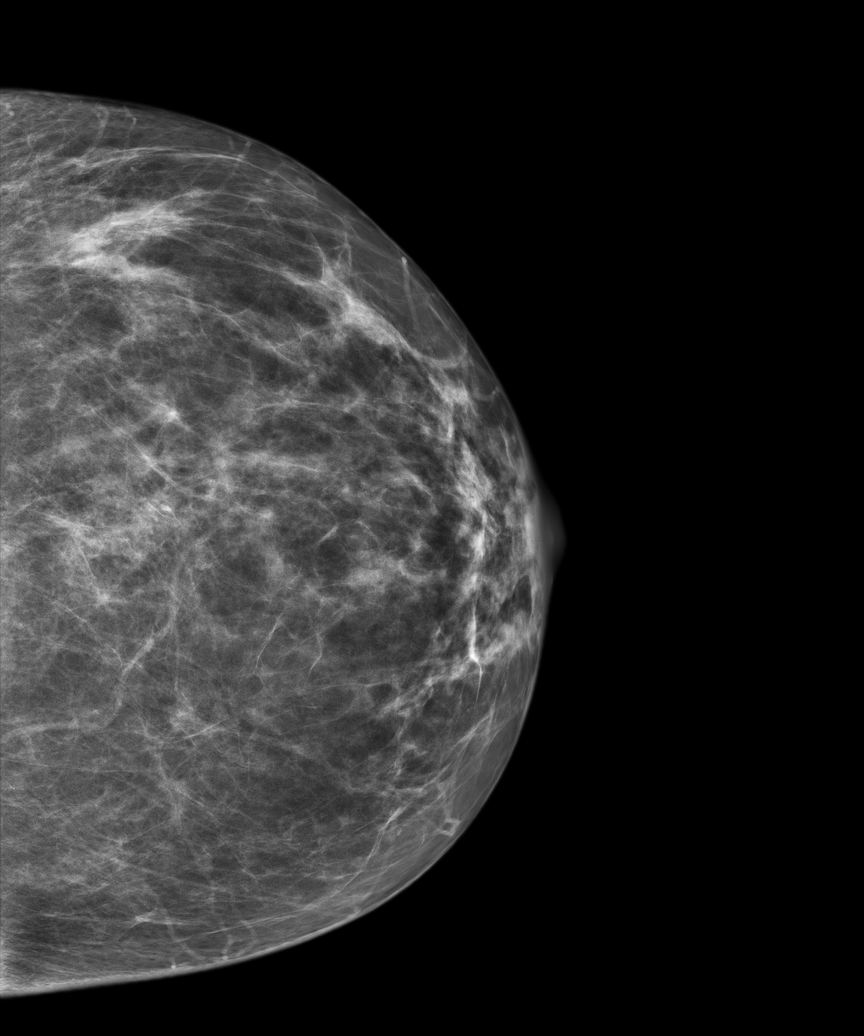
[im 3/4]
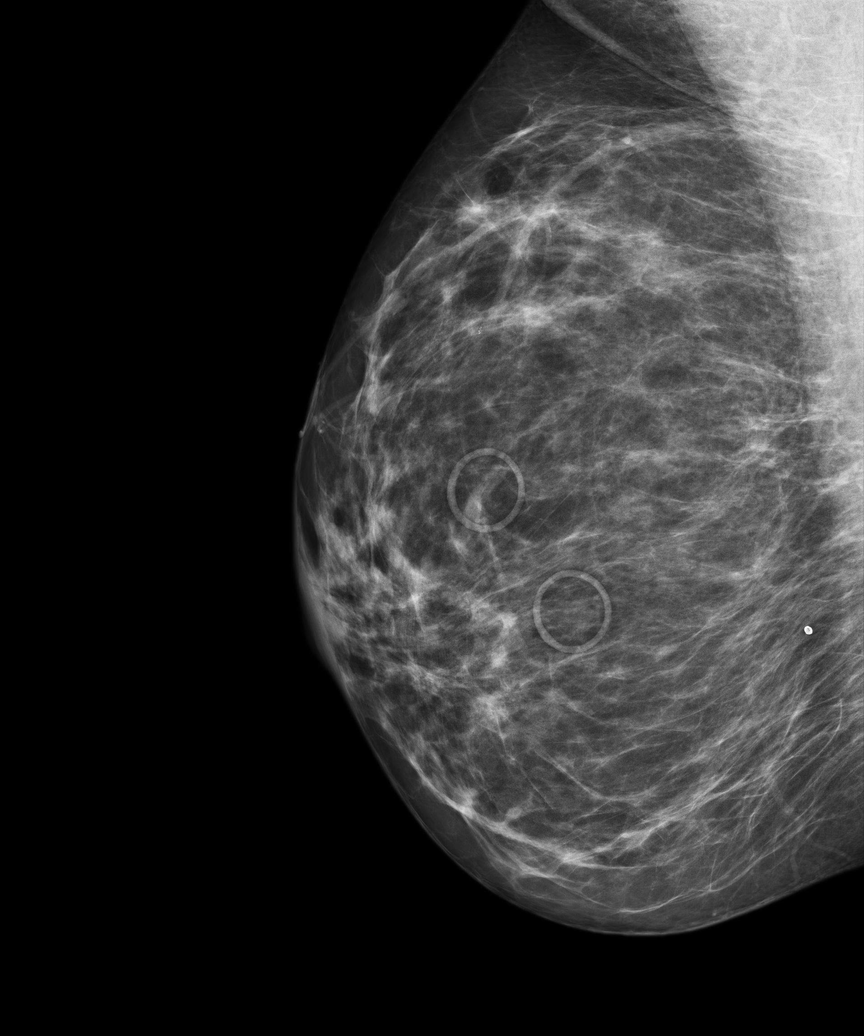
[im 4/4]
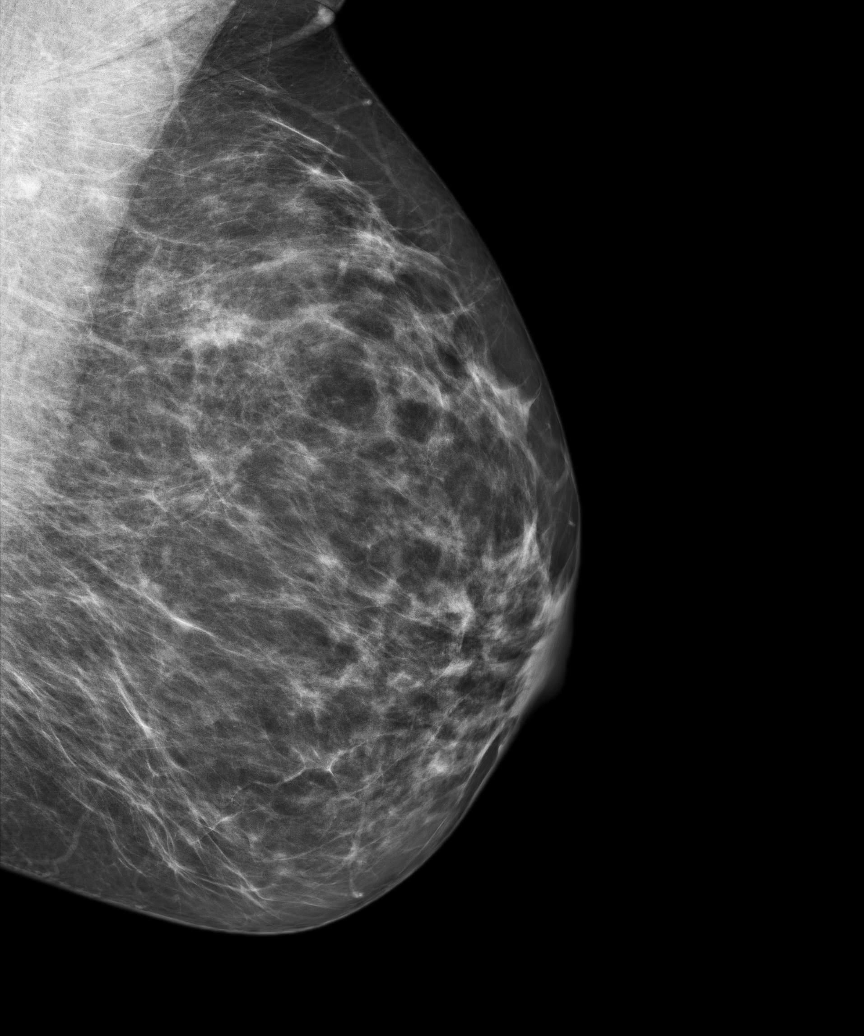

[R CC (2 of 2)]
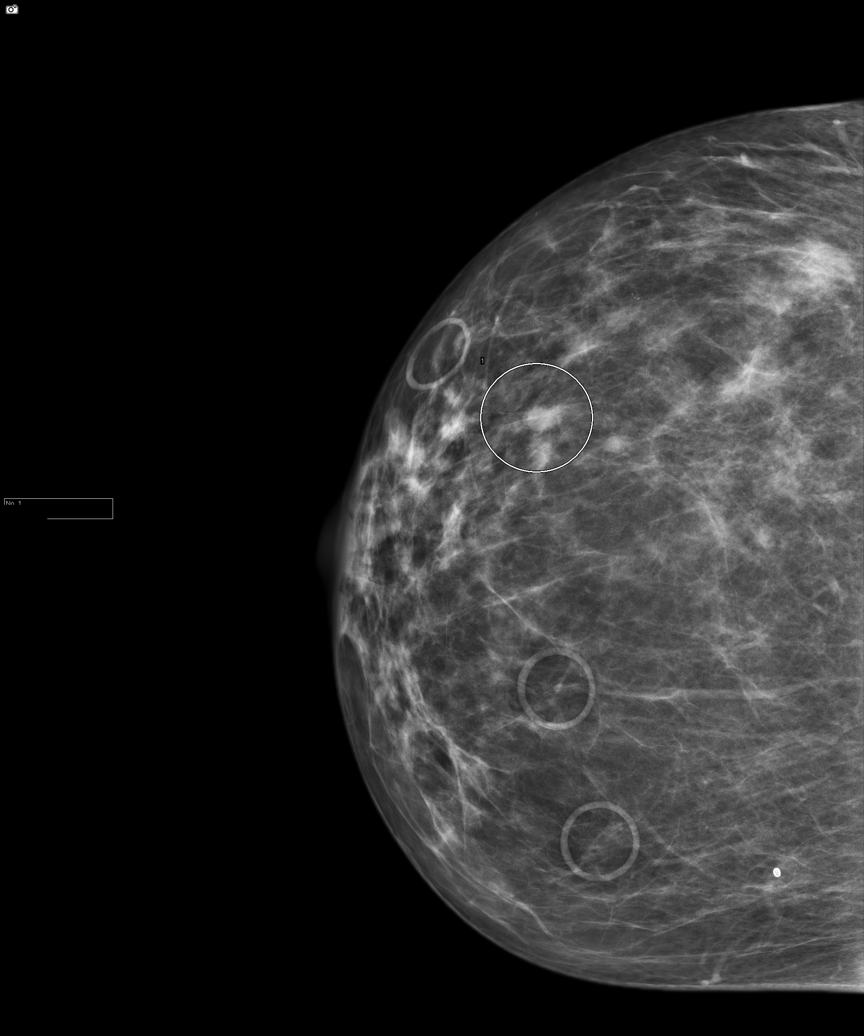

[R MLO]
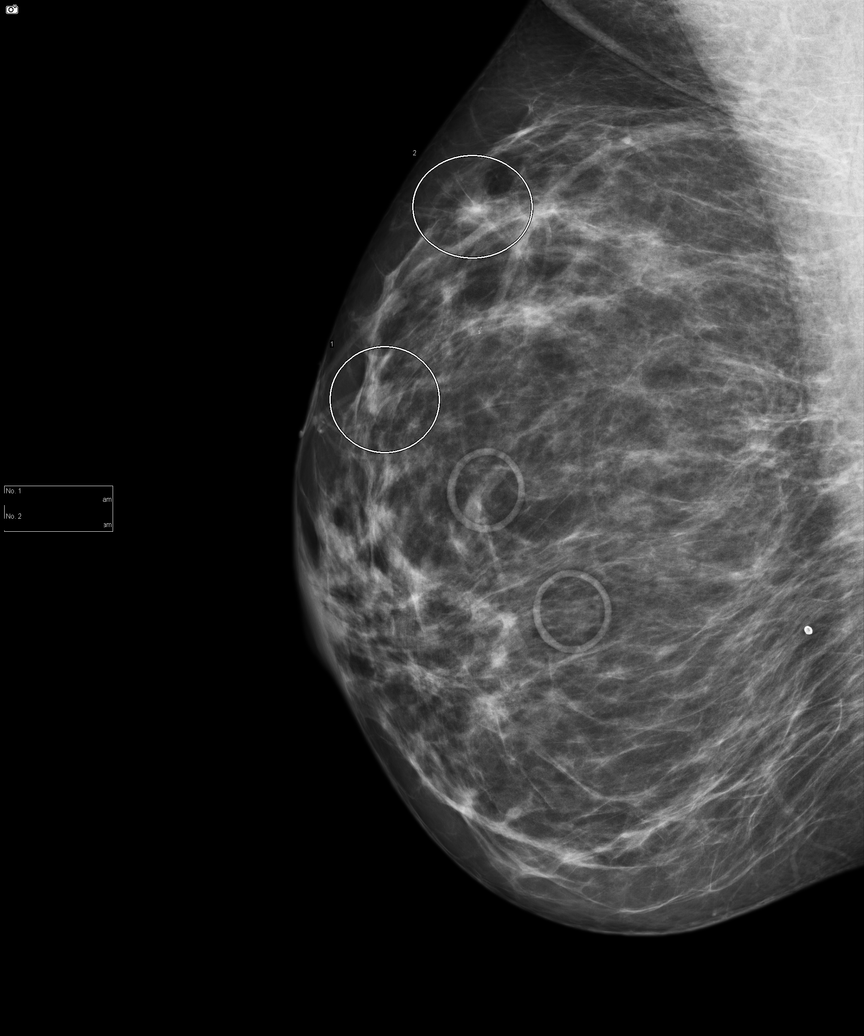

[6 of 6 positions shown; findings below may reference images not displayed]

FINDINGS: ACR Breast Density Category b:  There are scattered areas of
fibroglandular density.

In the right breast, possible masses warrant further evaluation
with spot compression views and possibly ultrasound.  In the left
breast, there are no findings suspicious for malignancy.

Images were processed with CAD.
IMPRESSION: Further evaluation is suggested for possible masses in the right
breast.

RECOMMENDATION:
Diagnostic mammogram and possibly ultrasound of the right breast.
(Code:MP-Y-EE6)

The patient will be contacted regarding the findings, and
additional imaging will be scheduled.

BI-RADS CATEGORY 0:  Incomplete.  Need additional imaging
evaluation and/or prior mammograms for comparison.

## 2013-06-12 ENCOUNTER — Encounter: Payer: Self-pay | Admitting: General Surgery

## 2013-06-13 NOTE — Progress Notes (Signed)
Quick Note:  Make sure the additional views are done prior to her office visit ______

## 2013-06-19 ENCOUNTER — Ambulatory Visit: Payer: Self-pay | Admitting: General Surgery

## 2013-06-28 ENCOUNTER — Ambulatory Visit: Payer: Self-pay | Admitting: General Surgery

## 2013-06-28 IMAGING — MG MM ADDITIONAL VIEWS AT NO CHARGE
1 series · 3 of 3 positions shown · non-contrast
Comparison: 06/09/2013 and earlier

CLINICAL DATA: The patient returns after screening study for
evaluation of the right breast.

EXAM:
MAMMOGRAPHIC ADDITIONAL VIEWS; RIGHT BREAST ULTRASOUND - NO CHARGE

[R CC · right · 3 of 3 slices shown]
[im 1/3]
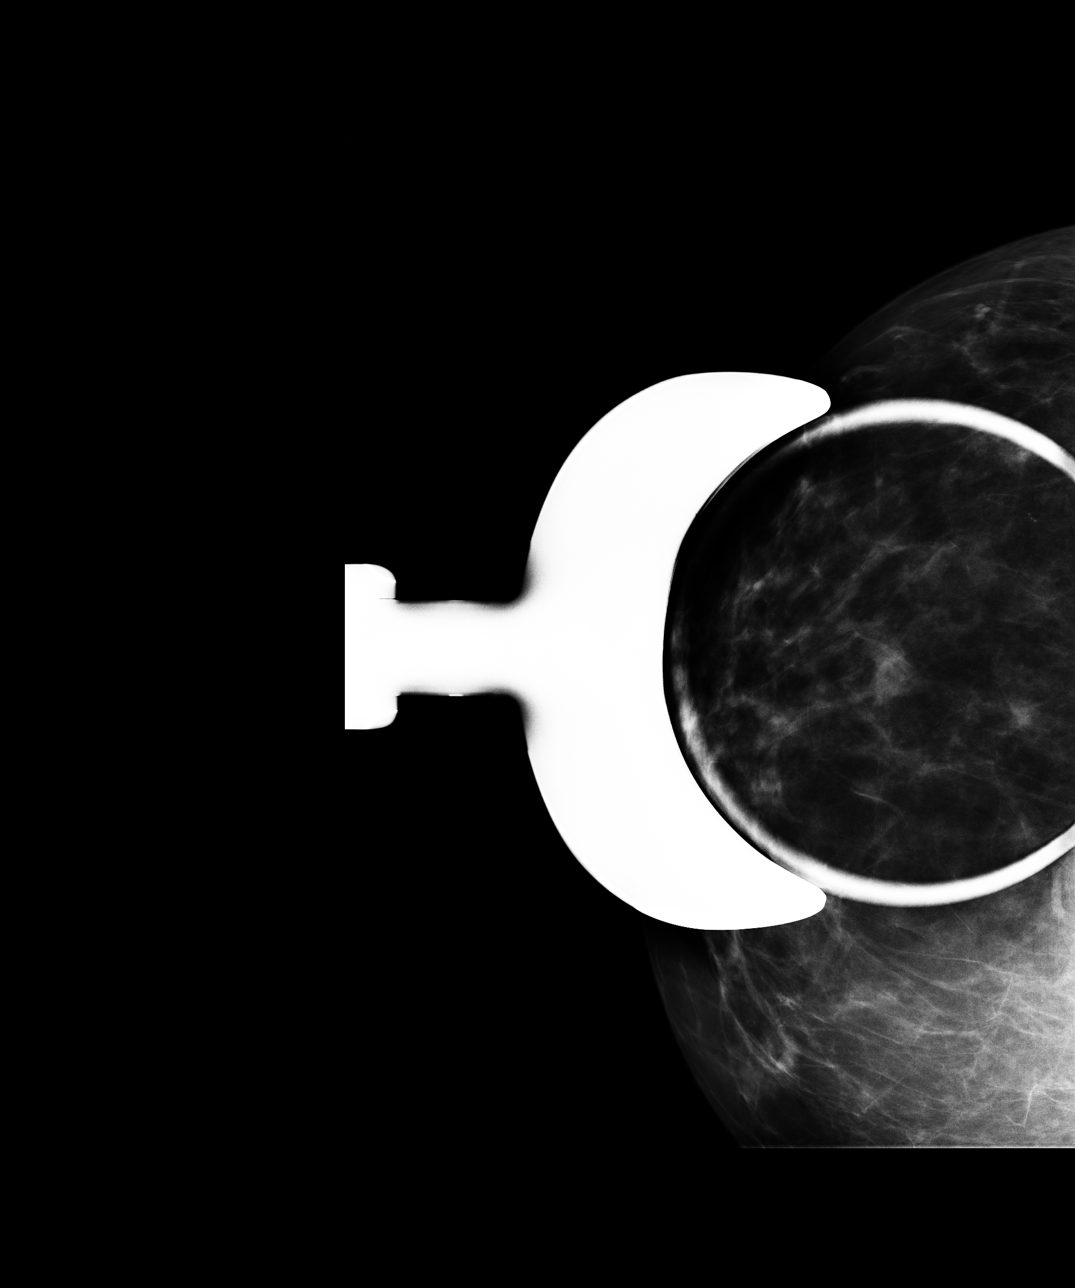
[im 2/3]
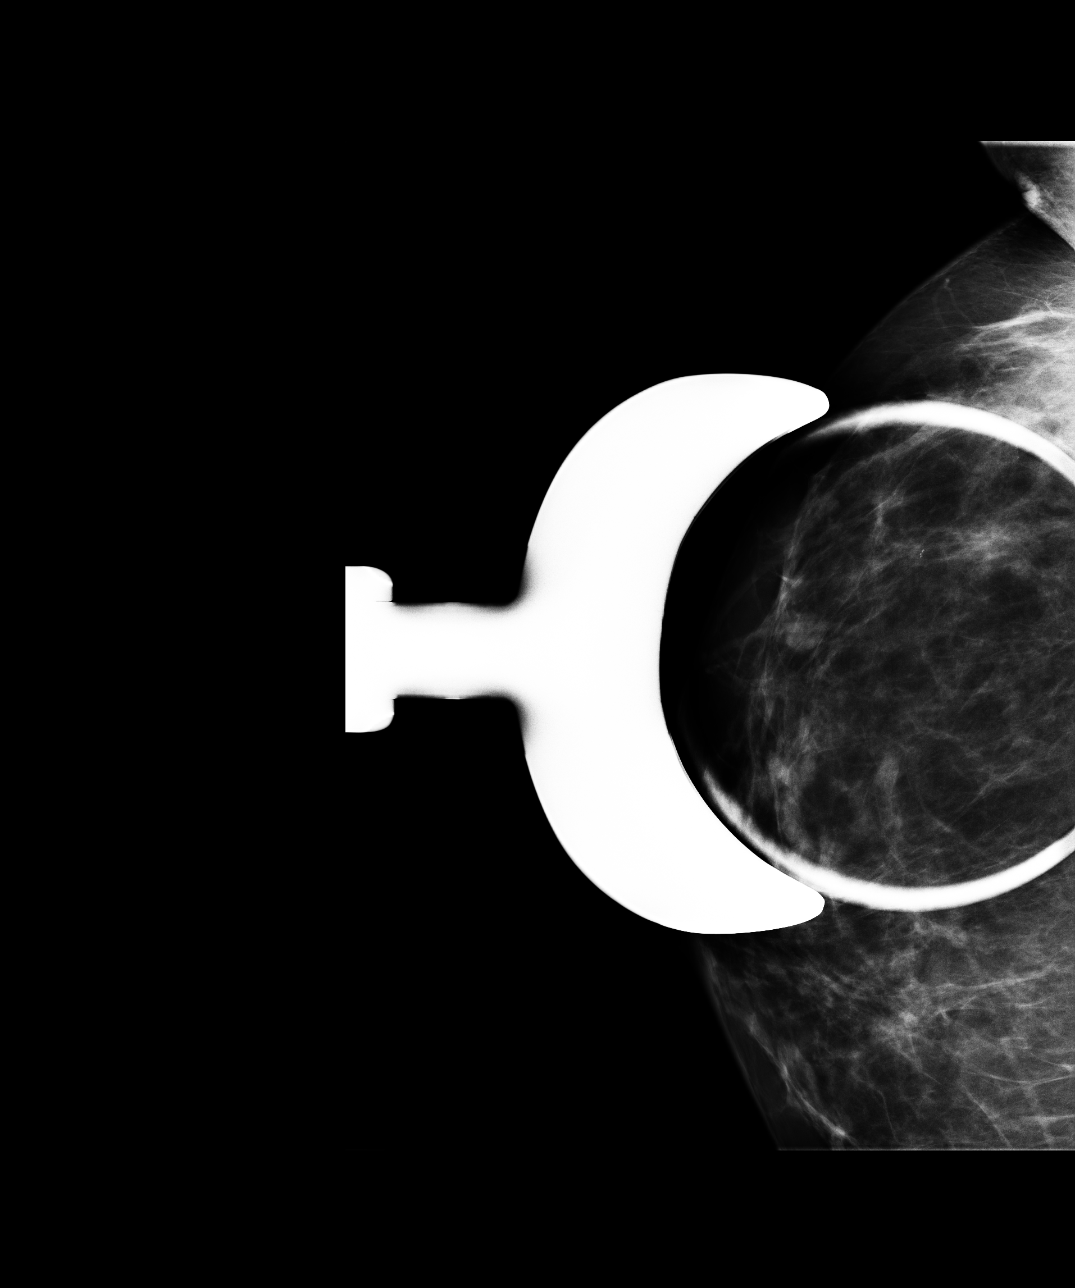
[im 3/3]
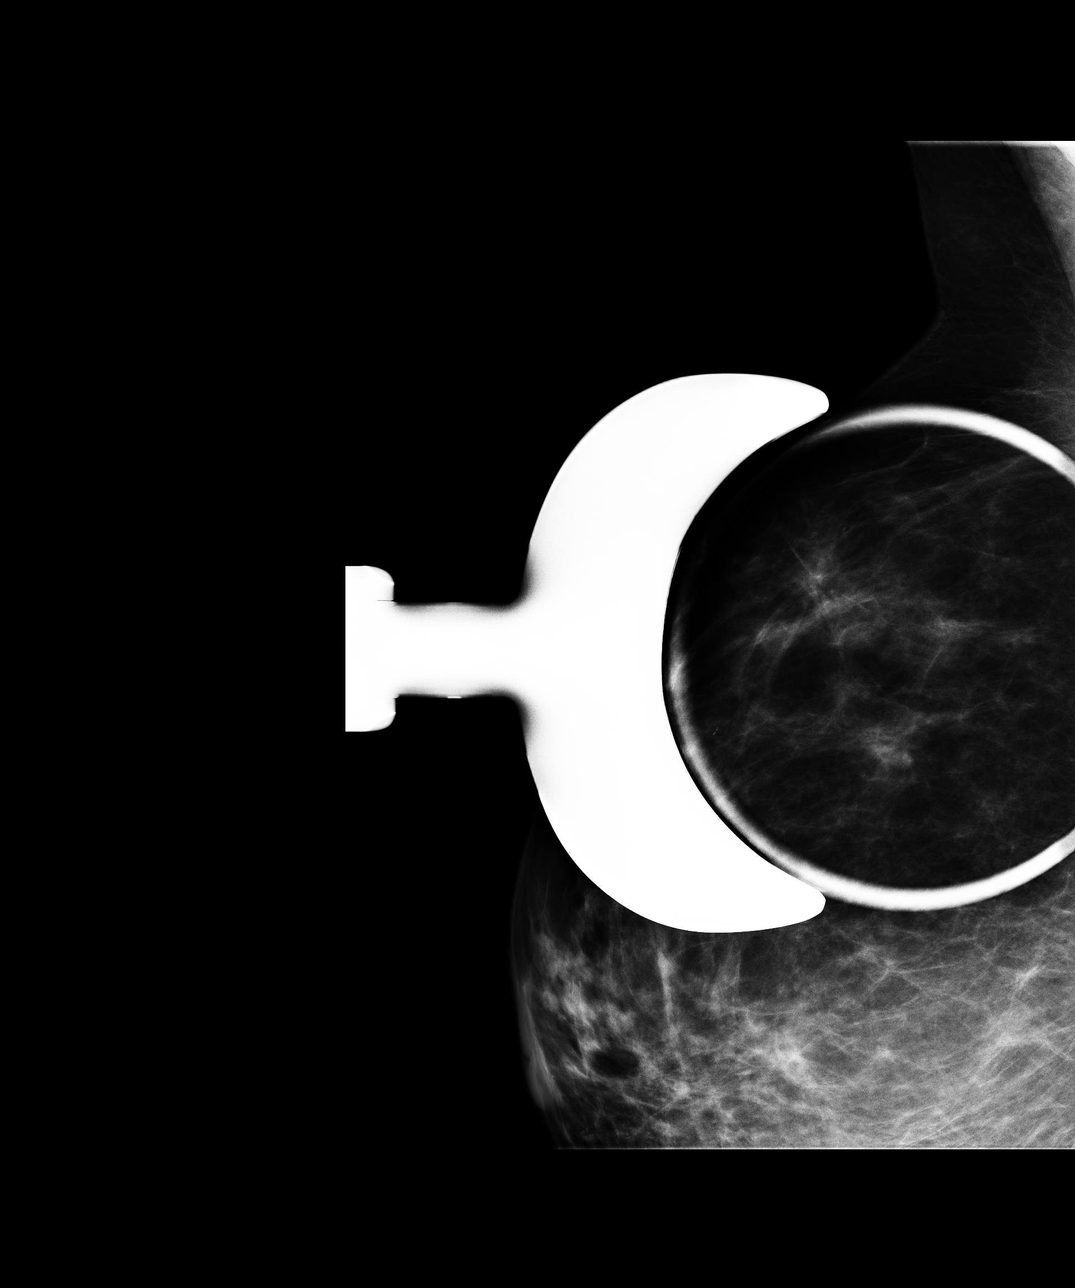

[3 of 3 positions shown; findings below may reference images not displayed]

ACR Breast Density Category c: The breasts are heterogeneously
dense, which may obscure small masses.
FINDINGS: Additional views are performed, confirming presence of nodularity in
the upper-outer quadrant of the right breast. No suspicious
microcalcifications or distortion identified.

On physical exam I palpate no abnormality in the upper-outer
quadrant of the right breast.

Ultrasound is performed, showing at least 3 discrete oval
hyperechoic masses in the upper-outer quadrant of the right breast.
These are seen in the 10 o'clock location, 6 cm from the nipple
measuring 0.8 x 0.8 x 0.4 cm; and in the 9 o'clock location, 12 cm
from the nipple, measuring 1.1 x 0.5 x 0.8 cm and 5 mm. The nodules
are noted to be compressible with the transducer. Given the
increased density seen mammographically, the findings are consistent
with angiolipomas. No suspicious masses are identified.
IMPRESSION: 1. Persistent nodules in the right breast are consistent with benign
angio lipoma is by ultrasound.
2.  No mammographic or ultrasound evidence for malignancy.

RECOMMENDATION:
Screening mammogram in one year.(Code:E1-D-LVA)

I have discussed the findings and recommendations with the patient.
Results were also provided in writing at the conclusion of the
visit. If applicable, a reminder letter will be sent to the patient
regarding the next appointment.

BI-RADS CATEGORY  2: Benign Finding(s)

## 2013-06-28 IMAGING — US RIGHT BREAST ULTRASOUND - NO CHARGE
1 series · 13 of 13 positions shown · non-contrast
Comparison: 06/09/2013 and earlier

CLINICAL DATA: The patient returns after screening study for
evaluation of the right breast.

EXAM:
MAMMOGRAPHIC ADDITIONAL VIEWS; RIGHT BREAST ULTRASOUND - NO CHARGE

[Series 1: right breast ultrasound - no charge · 0.08mm/px · 13 of 13 slices shown]
[im 1/13]
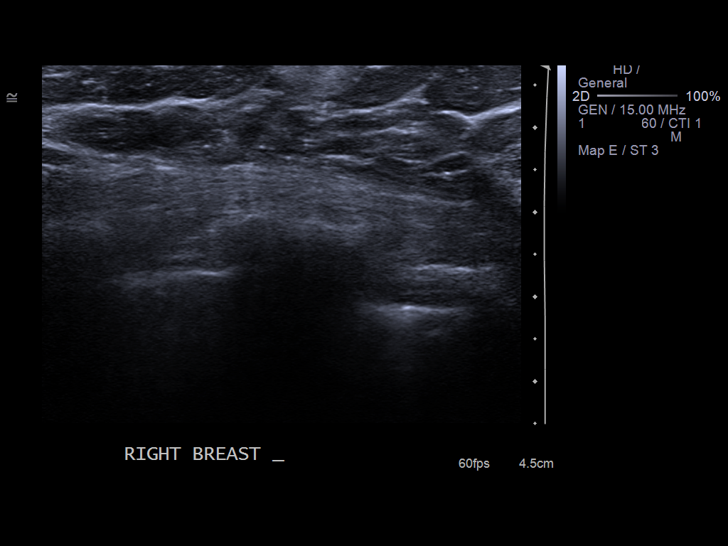
[im 2/13]
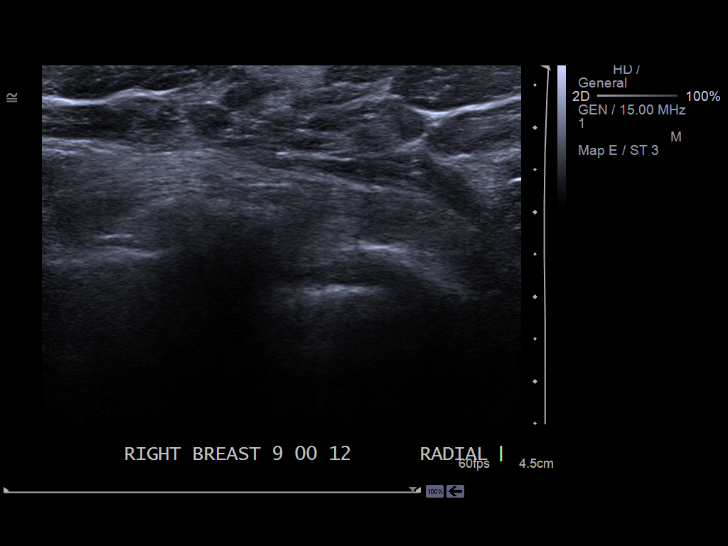
[im 3/13]
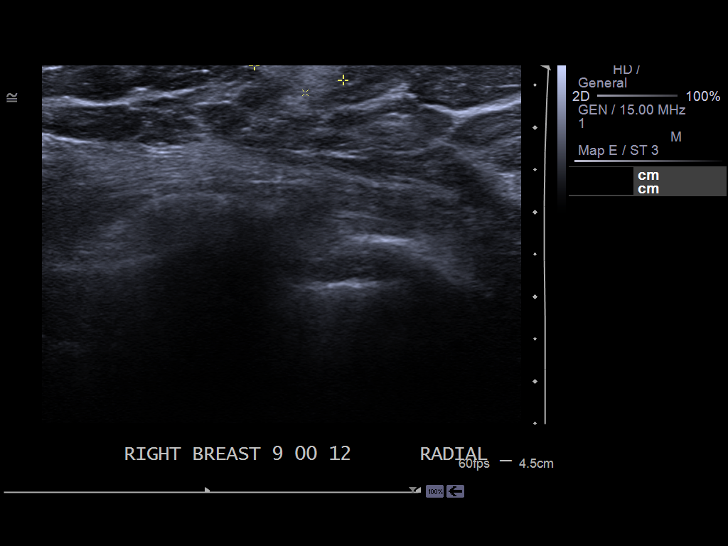
[im 4/13]
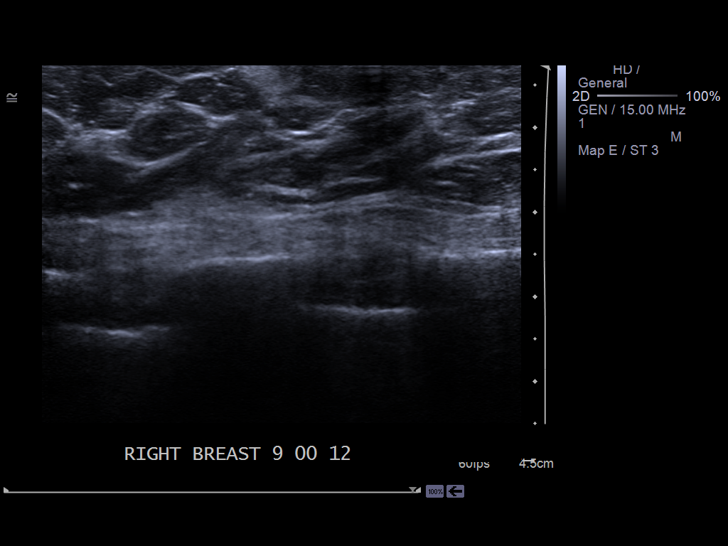
[im 5/13]
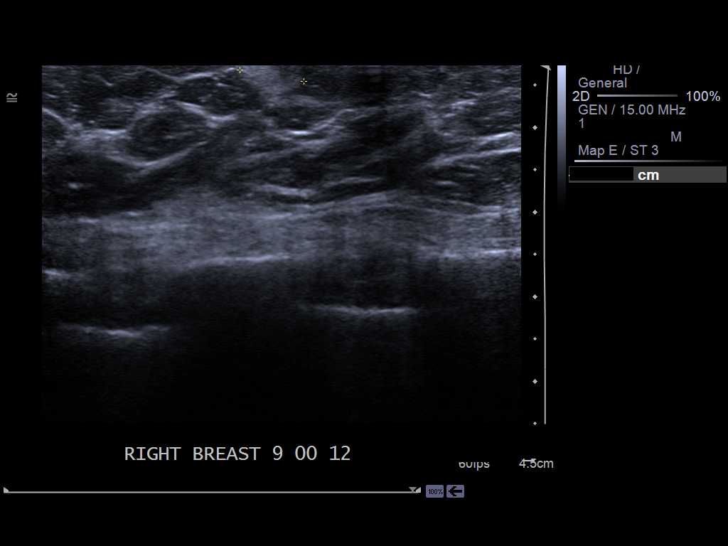
[im 6/13]
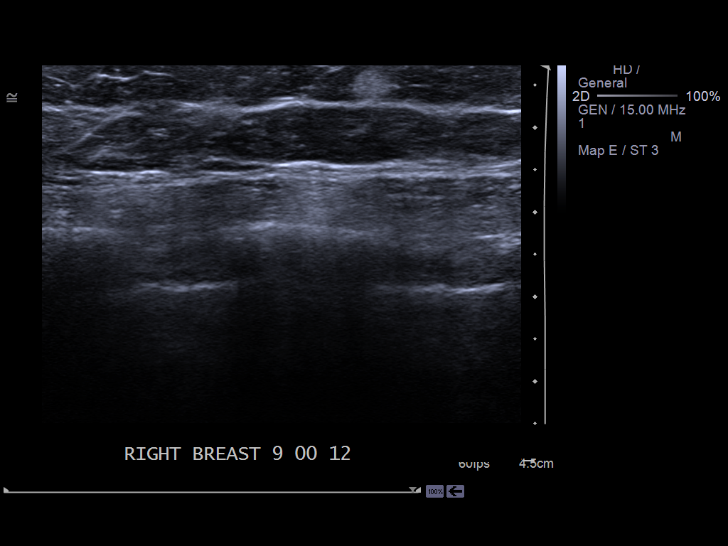
[im 7/13]
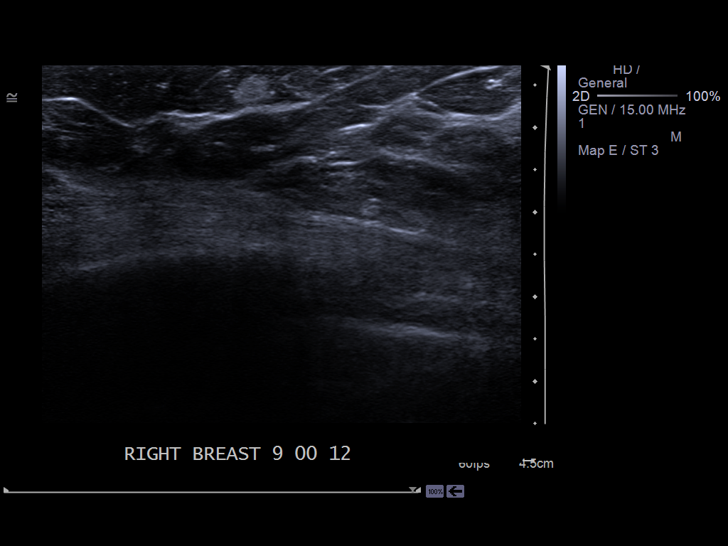
[im 8/13]
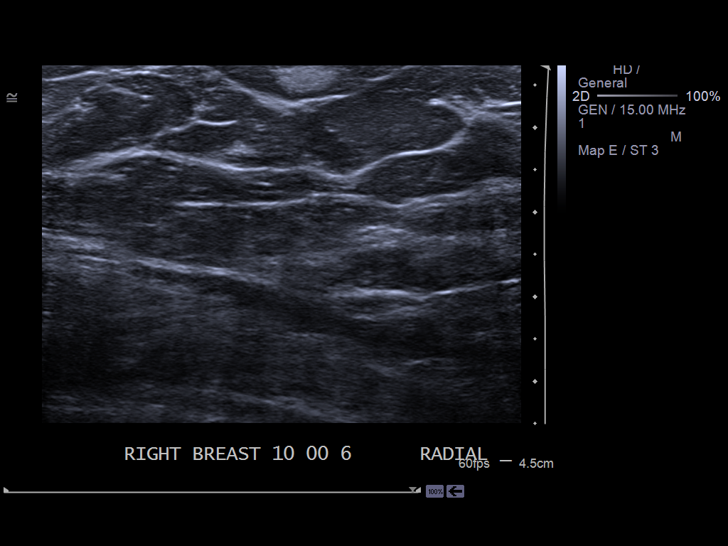
[im 9/13]
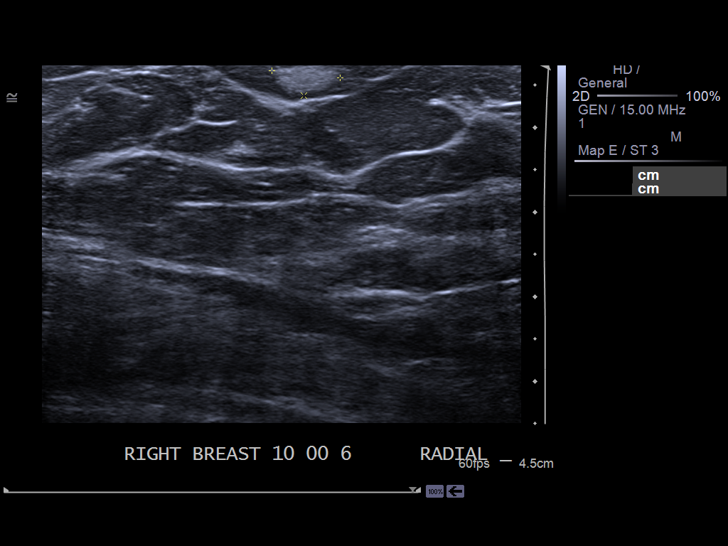
[im 10/13]
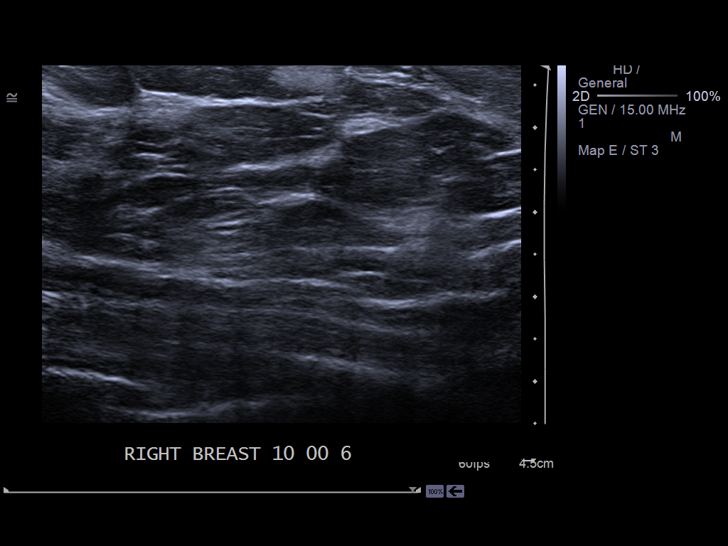
[im 11/13]
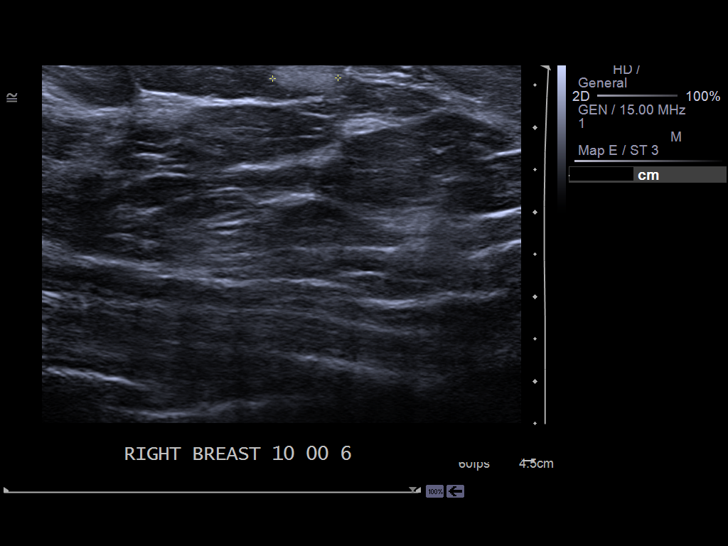
[im 12/13]
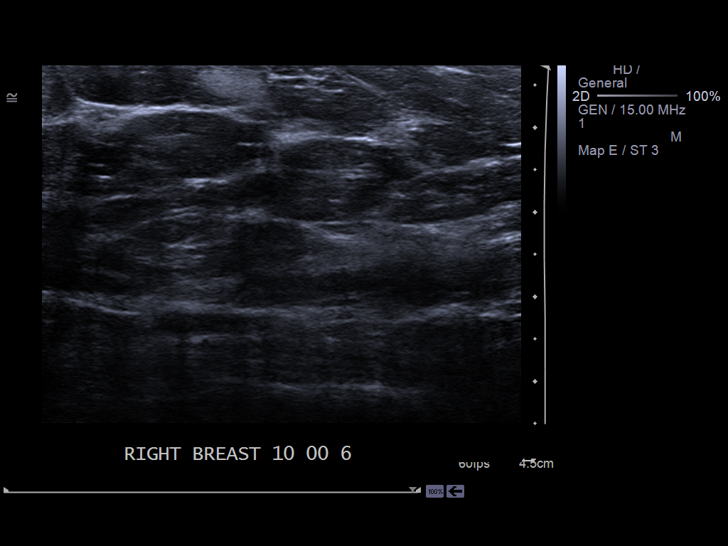
[im 13/13]
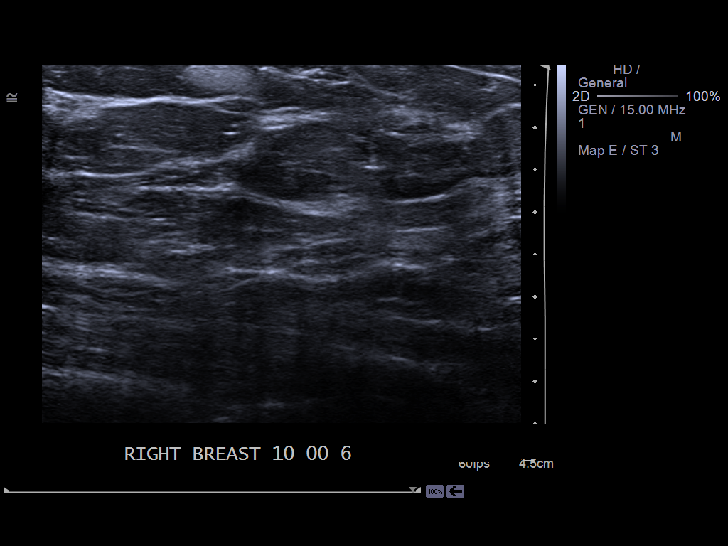

[13 of 13 positions shown; findings below may reference images not displayed]

ACR Breast Density Category c: The breasts are heterogeneously
dense, which may obscure small masses.
FINDINGS: Additional views are performed, confirming presence of nodularity in
the upper-outer quadrant of the right breast. No suspicious
microcalcifications or distortion identified.

On physical exam I palpate no abnormality in the upper-outer
quadrant of the right breast.

Ultrasound is performed, showing at least 3 discrete oval
hyperechoic masses in the upper-outer quadrant of the right breast.
These are seen in the 10 o'clock location, 6 cm from the nipple
measuring 0.8 x 0.8 x 0.4 cm; and in the 9 o'clock location, 12 cm
from the nipple, measuring 1.1 x 0.5 x 0.8 cm and 5 mm. The nodules
are noted to be compressible with the transducer. Given the
increased density seen mammographically, the findings are consistent
with angiolipomas. No suspicious masses are identified.
IMPRESSION: 1. Persistent nodules in the right breast are consistent with benign
angio lipoma is by ultrasound.
2.  No mammographic or ultrasound evidence for malignancy.

RECOMMENDATION:
Screening mammogram in one year.(Code:E1-D-LVA)

I have discussed the findings and recommendations with the patient.
Results were also provided in writing at the conclusion of the
visit. If applicable, a reminder letter will be sent to the patient
regarding the next appointment.

BI-RADS CATEGORY  2: Benign Finding(s)

## 2013-06-29 ENCOUNTER — Encounter: Payer: Self-pay | Admitting: General Surgery

## 2013-07-05 ENCOUNTER — Ambulatory Visit (INDEPENDENT_AMBULATORY_CARE_PROVIDER_SITE_OTHER): Payer: BC Managed Care – PPO | Admitting: General Surgery

## 2013-07-05 ENCOUNTER — Encounter: Payer: Self-pay | Admitting: General Surgery

## 2013-07-05 VITALS — BP 110/70 | HR 64 | Resp 12 | Ht 65.0 in | Wt 159.0 lb

## 2013-07-05 DIAGNOSIS — K5090 Crohn's disease, unspecified, without complications: Secondary | ICD-10-CM

## 2013-07-05 DIAGNOSIS — N6019 Diffuse cystic mastopathy of unspecified breast: Secondary | ICD-10-CM

## 2013-07-05 NOTE — Patient Instructions (Signed)
Continue self breast exams. Call office for any new breast issues or concerns. One year follow up bilateral screening mammogram and office visit

## 2013-07-05 NOTE — Progress Notes (Signed)
Patient ID: Chelsea Hicks, female   DOB: 1966-08-31, 47 y.o.   MRN: [id number]  Chief Complaint  Patient presents with  . Follow-up    mammogram    HPI Chelsea Hicks is a 47 y.o. female.  who presents for her annual follow up breast evaluation. The most recent mammogram was done on 06-28-13.  Patient does perform regular self breast checks and gets regular mammograms done.  No new breast complaints. Plans to see Dr. Vira Agar for her routine exam in December 2014 for her Crohns disease. She has 4-5 bowel movements a day-not bothered by it. No abd pain.  HPI  Past Medical History  Diagnosis Date  . Crohn's disease 2005  . 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 Regional enteritis of unspecified site   . Diffuse cystic mastopathy 2013  . Special screening for malignant neoplasms, colon     Past Surgical History  Procedure Laterality Date  . Hemoglobin electrophinesis    . Cholecystectomy  12/2001  . Bowel rescetion  07/2009  . Colonoscopy  2011    Dr. Vira Agar  . Dilation and curettage of uterus    . Tubal ligation    . Lipoma excision  2009    from back  . Lipoma excision Left 07/18/2012    arm  . Breast biopsy Left 02-11-04    core bx    Family History  Problem Relation Age of Onset  . Cancer Maternal Grandfather     prostate  . Cancer Paternal Grandmother     breast    Social History History  Substance Use Topics  . Smoking status: Never Smoker   . Smokeless tobacco: Not on file  . Alcohol Use: Yes     Comment: occasionally    Allergies  Allergen Reactions  . Amoxicillin-Pot Clavulanate     REACTION: hives  . Penicillins Hives  . Sulfonamide Derivatives     REACTION: hives    Current Outpatient Prescriptions  Medication Sig Dispense Refill  . azathioprine (IMURAN) 100 MG tablet Take 100 mg by mouth daily.        No current facility-administered medications for this visit.    Review of Systems Review of Systems   Constitutional: Negative.   Respiratory: Negative.   Cardiovascular: Negative.     Blood pressure 110/70, pulse 64, resp. rate 12, height 5' 5"  (1.651 m), weight 159 lb (72.122 kg), last menstrual period 06/25/2013.  Physical Exam Physical Exam  Constitutional: She is oriented to person, place, and time. She appears well-developed and well-nourished.  Eyes: Conjunctivae are normal. No scleral icterus.  Neck: Neck supple. No thyromegaly present.  Cardiovascular: Normal rate, regular rhythm and normal heart sounds.   Pulmonary/Chest: Effort normal and breath sounds normal. Right breast exhibits no inverted nipple, no mass, no nipple discharge, no skin change and no tenderness. Left breast exhibits no inverted nipple, no mass, no nipple discharge, no skin change and no tenderness.  Abdominal: Soft. Normal appearance and bowel sounds are normal. There is no hepatosplenomegaly. No hernia.  Lymphadenopathy:    She has no cervical adenopathy.    She has no axillary adenopathy.  Neurological: She is alert and oriented to person, place, and time.  Skin: Skin is warm and dry.    Data Reviewed Mammogram and ultrasound reviewed and stable.  Assessment    Physical exam stable.    Plan    One year follow up bilateral screening mammogram and office visit.  SANKAR,SEEPLAPUTHUR G 07/06/2013, 6:30 AM

## 2013-07-06 ENCOUNTER — Encounter: Payer: Self-pay | Admitting: General Surgery

## 2013-07-06 ENCOUNTER — Encounter: Payer: Self-pay | Admitting: Family Medicine

## 2013-07-06 ENCOUNTER — Ambulatory Visit (INDEPENDENT_AMBULATORY_CARE_PROVIDER_SITE_OTHER): Payer: BLUE CROSS/BLUE SHIELD | Admitting: Family Medicine

## 2013-07-06 VITALS — BP 124/70 | HR 81 | Temp 98.1°F | Wt 160.2 lb

## 2013-07-06 DIAGNOSIS — M79609 Pain in unspecified limb: Secondary | ICD-10-CM

## 2013-07-06 DIAGNOSIS — N6019 Diffuse cystic mastopathy of unspecified breast: Secondary | ICD-10-CM | POA: Insufficient documentation

## 2013-07-06 DIAGNOSIS — M79606 Pain in leg, unspecified: Secondary | ICD-10-CM | POA: Insufficient documentation

## 2013-07-06 DIAGNOSIS — M79604 Pain in right leg: Secondary | ICD-10-CM

## 2013-07-06 NOTE — Progress Notes (Signed)
R leg pain.  On imuran for crohn's.  Works in a school.  She had worn sandals and then had some R thigh and calf pain pain.  It was better when she would rub the area.  It got better when she changed shoes again.  "It's like it needs to stretch out."  More pain the last few days, but then better again now.  No night pain.  No trauma. Taking tylenol and used a heating pad recently w/o much help.  She is stretching her leg routinely, but she is less flexible.  No rash. No FCNAVD. No dysuria.  No back pain except for occ rare mild lower back pain.  No leg weakness.  No radicular pain.  "It feels like a muscle pull." dull ache per patient.  No L sided sx.  She does better walking but more pain if prolonged standing.  All pain is posterior on the R upper and lower leg.   Meds, vitals, and allergies reviewed.   ROS: See HPI.  Otherwise, noncontributory.  nad ncat Normal S/S in the BLE with normal ROM at the hips, knees, ankles SLR neg Tight stretching hamstring and calf on R leg. No rash

## 2013-07-06 NOTE — Assessment & Plan Note (Signed)
No calf discrepancy, both 40cm. Likely calf and hamstring strain, would improve with wearing a heel or heel wedge.  Stretch, reassured, f/u prn. Should resolve.  Anatomy d/w pt.

## 2013-07-06 NOTE — Patient Instructions (Signed)
I would wear a heel or use a heel lift.   Gently stretch your hamstring and calf.  Take care.

## 2013-08-10 ENCOUNTER — Encounter: Payer: Self-pay | Admitting: Family Medicine

## 2013-08-10 ENCOUNTER — Ambulatory Visit (INDEPENDENT_AMBULATORY_CARE_PROVIDER_SITE_OTHER): Payer: BLUE CROSS/BLUE SHIELD | Admitting: Family Medicine

## 2013-08-10 VITALS — BP 112/84 | HR 86 | Temp 98.5°F | Wt 156.0 lb

## 2013-08-10 DIAGNOSIS — R202 Paresthesia of skin: Secondary | ICD-10-CM | POA: Insufficient documentation

## 2013-08-10 DIAGNOSIS — R209 Unspecified disturbances of skin sensation: Secondary | ICD-10-CM

## 2013-08-10 NOTE — Patient Instructions (Signed)
I would put arch supports in your shoes and keep stretching.  This should improve.

## 2013-08-10 NOTE — Progress Notes (Signed)
Pre-visit discussion using our clinic review tool. No additional management support is needed unless otherwise documented below in the visit note.  The prev R leg pain is getting better.  She stretches her leg each AM.  That may have helped.  No L leg sx.  No radicular pain.    She had numbness on the plantar side of the R, not L, foot.  First noted last week. No weakness.  No numbness on the top of the foot.  No new shoes, nothing atypical.  Normal sensation in the foot now.  Numbness happened with standing.  Started at the heel, lasted about 5 minutes.  Self resolved. No back pain.    Meds, vitals, and allergies reviewed.   ROS: See HPI.  Otherwise, noncontributory.  nad CN 2-12 wnl B, S/S/DTR wnl x4 Normal sensation (monofilament, light touch, temperature, vibration) and motor function on the R foot. Normal DP pulse.

## 2013-08-10 NOTE — Assessment & Plan Note (Signed)
She doesn't have much arch support in her shoes.  Normal wear pattern noted.  This was likely a transient compressive issue.  Anatomy d/w pt.  Would use soft, mild arch support inserts and go from there.  This doesn't appear to be related to the (resolving) leg pain that was likely muscular.  This doesn't appear to be a neuropathy or radicular issue.

## 2014-06-25 ENCOUNTER — Encounter: Payer: Self-pay | Admitting: General Surgery

## 2014-07-03 ENCOUNTER — Encounter: Payer: Self-pay | Admitting: General Surgery

## 2014-07-03 ENCOUNTER — Ambulatory Visit (INDEPENDENT_AMBULATORY_CARE_PROVIDER_SITE_OTHER): Payer: BC Managed Care – PPO | Admitting: General Surgery

## 2014-07-03 VITALS — BP 128/80 | HR 80 | Resp 12 | Ht 65.0 in | Wt 161.0 lb

## 2014-07-03 DIAGNOSIS — K50919 Crohn's disease, unspecified, with unspecified complications: Secondary | ICD-10-CM

## 2014-07-03 DIAGNOSIS — N6012 Diffuse cystic mastopathy of left breast: Secondary | ICD-10-CM

## 2014-07-03 NOTE — Patient Instructions (Signed)
Continue self breast exams. Call office for any new breast issues or concerns. Follow up in one year with bilateral screening mammogram and office visit

## 2014-07-03 NOTE — Progress Notes (Signed)
Patient ID: Chelsea Hicks, female   DOB: 1966-06-30, 48 y.o.   MRN: [id number]  Chief Complaint  Patient presents with  . Follow-up    mammogram    HPI Chelsea Hicks is a 48 y.o. female.  who presents for a breast evaluation. The most recent mammogram was done on 06-21-14.  Patient does perform regular self breast checks and gets regular mammograms done.  No new breast issues. No GI symptoms- pt has had right colon resection for Crohns. Appointment with Dr Vira Agar was July 2015. She is doing well on the Imuran.  HPI  Past Medical History  Diagnosis Date  . Crohn's disease 2005  . 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 Regional enteritis of unspecified site   . Diffuse cystic mastopathy 2013  . Special screening for malignant neoplasms, colon     Past Surgical History  Procedure Laterality Date  . Hemoglobin electrophinesis    . Cholecystectomy  12/2001  . Bowel rescetion  07/2009  . Colonoscopy  2011    Dr. Vira Agar  . Dilation and curettage of uterus    . Tubal ligation    . Lipoma excision  2009    from back  . Lipoma excision Left 07/18/2012    arm  . Breast biopsy Left 02-11-04    core bx    Family History  Problem Relation Age of Onset  . Cancer Maternal Grandfather     prostate  . Cancer Paternal Grandmother     breast    Social History History  Substance Use Topics  . Smoking status: Never Smoker   . Smokeless tobacco: Never Used  . Alcohol Use: Yes     Comment: occasionally    Allergies  Allergen Reactions  . Amoxicillin-Pot Clavulanate     REACTION: hives  . Penicillins Hives  . Sulfonamide Derivatives     REACTION: hives    Current Outpatient Prescriptions  Medication Sig Dispense Refill  . azathioprine (IMURAN) 100 MG tablet Take 100 mg by mouth daily.       . Multiple Vitamin (MULTIVITAMIN) tablet Take 1 tablet by mouth daily.       No current facility-administered medications for this visit.     Review of Systems Review of Systems  Constitutional: Negative.   Respiratory: Negative.   Cardiovascular: Negative.   Gastrointestinal: Negative for abdominal pain.    Blood pressure 128/80, pulse 80, resp. rate 12, height 5' 5"  (1.651 m), weight 161 lb (73.029 kg), last menstrual period 06/25/2014.  Physical Exam Physical Exam  Constitutional: She is oriented to person, place, and time. She appears well-developed and well-nourished.  Eyes: Conjunctivae are normal. No scleral icterus.  Neck: Neck supple. No thyromegaly present.  Cardiovascular: Normal rate, regular rhythm and normal heart sounds.   Pulmonary/Chest: Effort normal and breath sounds normal. Right breast exhibits no inverted nipple, no mass, no nipple discharge, no skin change and no tenderness. Left breast exhibits no inverted nipple, no mass, no nipple discharge, no skin change and no tenderness.  Abdominal: Soft. Normal appearance and bowel sounds are normal. There is no hepatosplenomegaly. There is no tenderness. No hernia.  Lymphadenopathy:    She has no cervical adenopathy.    She has no axillary adenopathy.  Neurological: She is alert and oriented to person, place, and time.  Skin: Skin is warm and dry.    Data Reviewed Mammogram reviewed and stable.  Assessment    Stable physical exam. She is  doing well on the Imuran.    Plan    Follow up in one year with bilateral screening mammogram and office visit.       Davionte Lusby G 07/04/2014, 5:59 AM

## 2014-07-04 ENCOUNTER — Encounter: Payer: Self-pay | Admitting: General Surgery

## 2014-07-09 ENCOUNTER — Encounter: Payer: Self-pay | Admitting: General Surgery

## 2014-09-24 ENCOUNTER — Other Ambulatory Visit (HOSPITAL_COMMUNITY)
Admission: RE | Admit: 2014-09-24 | Discharge: 2014-09-24 | Disposition: A | Discharge status: Home / Self Care | Payer: BC Managed Care – PPO | Source: Ambulatory Visit | Attending: Family Medicine | Admitting: Family Medicine

## 2014-09-24 ENCOUNTER — Ambulatory Visit (INDEPENDENT_AMBULATORY_CARE_PROVIDER_SITE_OTHER): Payer: BC Managed Care – PPO | Admitting: Family Medicine

## 2014-09-24 ENCOUNTER — Encounter: Payer: Self-pay | Admitting: Family Medicine

## 2014-09-24 ENCOUNTER — Encounter (INDEPENDENT_AMBULATORY_CARE_PROVIDER_SITE_OTHER): Payer: Self-pay

## 2014-09-24 VITALS — BP 110/70 | HR 84 | Temp 98.1°F | Ht 65.5 in | Wt 161.0 lb

## 2014-09-24 DIAGNOSIS — D539 Nutritional anemia, unspecified: Secondary | ICD-10-CM

## 2014-09-24 DIAGNOSIS — N6019 Diffuse cystic mastopathy of unspecified breast: Secondary | ICD-10-CM

## 2014-09-24 DIAGNOSIS — Z1151 Encounter for screening for human papillomavirus (HPV): Secondary | ICD-10-CM | POA: Diagnosis present

## 2014-09-24 DIAGNOSIS — Z01419 Encounter for gynecological examination (general) (routine) without abnormal findings: Secondary | ICD-10-CM

## 2014-09-24 DIAGNOSIS — Z23 Encounter for immunization: Secondary | ICD-10-CM

## 2014-09-24 DIAGNOSIS — Z0000 Encounter for general adult medical examination without abnormal findings: Secondary | ICD-10-CM | POA: Insufficient documentation

## 2014-09-24 DIAGNOSIS — K5090 Crohn's disease, unspecified, without complications: Secondary | ICD-10-CM

## 2014-09-24 DIAGNOSIS — R4586 Emotional lability: Secondary | ICD-10-CM | POA: Insufficient documentation

## 2014-09-24 DIAGNOSIS — N943 Premenstrual tension syndrome: Secondary | ICD-10-CM

## 2014-09-24 LAB — COMPREHENSIVE METABOLIC PANEL
ALT: 15 U/L (ref 0–35)
AST: 18 U/L (ref 0–37)
Albumin: 4 g/dL (ref 3.5–5.2)
Alkaline Phosphatase: 62 U/L (ref 39–117)
BILIRUBIN TOTAL: 0.5 mg/dL (ref 0.2–1.2)
BUN: 6 mg/dL (ref 6–23)
CO2: 30 meq/L (ref 19–32)
Calcium: 9.3 mg/dL (ref 8.4–10.5)
Chloride: 105 mEq/L (ref 96–112)
Creatinine, Ser: 0.66 mg/dL (ref 0.40–1.20)
GFR: 101.46 mL/min (ref >60.00)
GLUCOSE: 94 mg/dL (ref 70–99)
Potassium: 4.1 mEq/L (ref 3.5–5.1)
SODIUM: 139 meq/L (ref 135–145)
TOTAL PROTEIN: 7 g/dL (ref 6.0–8.3)

## 2014-09-24 LAB — CBC WITH DIFFERENTIAL/PLATELET
Basophils Absolute: 0 10*3/uL (ref 0.0–0.1)
Basophils Relative: 0.5 % (ref 0.0–3.0)
EOS ABS: 0.3 10*3/uL (ref 0.0–0.7)
EOS PCT: 4.6 % (ref 0.0–5.0)
HCT: 42.5 % (ref 36.0–46.0)
HEMOGLOBIN: 14.1 g/dL (ref 12.0–15.0)
Lymphocytes Relative: 17.9 % (ref 12.0–46.0)
Lymphs Abs: 1.2 10*3/uL (ref 0.7–4.0)
MCHC: 33.3 g/dL (ref 30.0–36.0)
MCV: 90.5 fl (ref 78.0–100.0)
MONO ABS: 0.7 10*3/uL (ref 0.1–1.0)
MONOS PCT: 9.7 % (ref 3.0–12.0)
Neutro Abs: 4.7 10*3/uL (ref 1.4–7.7)
Neutrophils Relative %: 67.3 % (ref 43.0–77.0)
Platelets: 283 10*3/uL (ref 150.0–400.0)
RBC: 4.69 Mil/uL (ref 3.87–5.11)
RDW: 13.8 % (ref 11.5–15.5)
WBC: 7 10*3/uL (ref 4.0–10.5)

## 2014-09-24 LAB — LIPID PANEL
CHOL/HDL RATIO: 2
CHOLESTEROL: 179 mg/dL (ref 0–200)
HDL: 72.9 mg/dL (ref >39.00)
LDL Cholesterol: 78 mg/dL (ref 0–99)
NONHDL: 106.1
TRIGLYCERIDES: 142 mg/dL (ref 0.0–149.0)
VLDL: 28.4 mg/dL (ref 0.0–40.0)

## 2014-09-24 LAB — TSH: TSH: 1.71 u[IU]/mL (ref 0.35–4.50)

## 2014-09-24 MED ORDER — FLUOXETINE HCL 20 MG PO TABS: ORAL_TABLET | ORAL | Status: DC

## 2014-09-24 NOTE — Patient Instructions (Signed)
Great to see you. We will call you with your lab results and you can view them online.  Call me in a couple of months to let me know how the prozac is working.

## 2014-09-24 NOTE — Progress Notes (Signed)
Subjective:   Patient ID: Chelsea Hicks, female    DOB: 03-31-1966, 49 y.o.   MRN: [id number]  Chelsea Hicks is a pleasant 49 y.o. year old female who presents to clinic today with Pearson  on 09/24/2014  HPI: G3P2- overdue for pap smear.  Would like a CPX/pap smear today. Having periods but they are becoming more irregular.  Also having more hot flashes and mood swings. Controls her mood swings but "I dont like who I am at that time." Denies SI or HI.  Mammogram UTD- 06/2014- sees Dr. Tawni Pummel.  Colonoscopy 2011. Crohns- on Imuran.  Has not had any flares since she started this in 2010.  Tdap 11/11/2010.  Current Outpatient Prescriptions on File Prior to Visit  Medication Sig Dispense Refill  . azathioprine (IMURAN) 100 MG tablet Take 100 mg by mouth daily.     . Multiple Vitamin (MULTIVITAMIN) tablet Take 1 tablet by mouth daily.     No current facility-administered medications on file prior to visit.    Allergies  Allergen Reactions  . Amoxicillin-Pot Clavulanate     REACTION: hives  . Penicillins Hives  . Sulfonamide Derivatives     REACTION: hives    Past Medical History  Diagnosis Date  . Crohn's disease 2005  . 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 Regional enteritis of unspecified site   . Diffuse cystic mastopathy 2013  . Special screening for malignant neoplasms, colon     Past Surgical History  Procedure Laterality Date  . Hemoglobin electrophinesis    . Cholecystectomy  12/2001  . Bowel rescetion  07/2009  . Colonoscopy  2011    Dr. Vira Agar  . Dilation and curettage of uterus    . Tubal ligation    . Lipoma excision  2009    from back  . Lipoma excision Left 07/18/2012    arm  . Breast biopsy Left 02-11-04    core bx    Family History  Problem Relation Age of Onset  . Cancer Maternal Grandfather     prostate  . Cancer Paternal Grandmother     breast    History   Social History  . Marital  Status: Married    Spouse Name: N/A    Number of Children: 2  . 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 Smoking status: Never Smoker   . Smokeless tobacco: Never Used  . Alcohol Use: 0.0 oz/week    0 Not specified per week     Comment: occasionally  . Drug Use: No  . Sexual Activity: Not on file   Other Topics Concern  . 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  Review of Systems  Constitutional: Negative.   HENT: Negative.   Eyes: Negative.   Respiratory: Negative.   Gastrointestinal: Negative.   Endocrine: Negative.   Genitourinary: Positive for menstrual problem.  Skin: Negative.   Allergic/Immunologic: Negative.   Neurological: Negative.   Psychiatric/Behavioral: Positive for dysphoric mood. Negative for suicidal ideas, behavioral problems, confusion, sleep disturbance, self-injury and agitation. The patient is not nervous/anxious and is not hyperactive.   All other systems reviewed and are negative.      Objective:  BP 110/70 mmHg  Pulse 84  Temp(Src) 98.1 F (36.7 C) (Oral)  Ht 5' 5.5" (1.664 m)  Wt 161 lb (73.029 kg)  BMI 26.37 kg/m2  LMP 09/10/2014   Physical Exam  Nursing note and vitals reviewed.   General:  Well-developed,well-nourished,in no acute distress; alert,appropriate and cooperative throughout examination Head:  normocephalic and atraumatic.   Eyes:  vision grossly intact, pupils equal, pupils round, and pupils reactive to light.   Ears:  R ear normal and L ear normal.   Nose:  no external deformity.   Mouth:  good dentition.   Neck:  No deformities, masses, or tenderness noted. Breasts:  No mass, nodules, thickening, tenderness, bulging, retraction, inflamation, nipple discharge  or skin changes noted.   Lungs:  Normal respiratory effort, chest expands symmetrically. Lungs are clear to auscultation, no crackles or wheezes. Heart:  Normal rate and regular rhythm. S1 and S2 normal without gallop, murmur, click, rub or other extra sounds. Abdomen:  Bowel sounds positive,abdomen soft and non-tender without masses, organomegaly or hernias noted. Rectal:  no external abnormalities.   Genitalia:  Pelvic Exam:        External: normal female genitalia without lesions or masses        Vagina: normal without lesions or masses        Cervix: normal without lesions or masses        Adnexa: normal bimanual exam without masses or fullness        Uterus: normal by palpation        Pap smear: performed Msk:  No deformity or scoliosis noted of thoracic or lumbar spine.   Extremities:  No clubbing, cyanosis, edema, or deformity noted with normal full range of motion of all joints.   Neurologic:  alert & oriented X3 and gait normal.   Skin:  Intact without suspicious lesions or rashes Cervical Nodes:  No lymphadenopathy noted Axillary Nodes:  No palpable lymphadenopathy Psych:  Cognition and judgment appear intact. Alert and cooperative with normal attention span and concentration. No apparent delusions, illusions, hallucinations       Assessment & Plan:   Well woman exam with routine gynecological exam  Regional enteritis, without complications  Deficiency anemia  Fibrocystic breast disease, unspecified laterality No Follow-up on file.

## 2014-09-24 NOTE — Assessment & Plan Note (Signed)
Reviewed preventive care protocols, scheduled due services, and updated immunizations Discussed nutrition, exercise, diet, and healthy lifestyle.  Pap smear done today. Pneumovax and influenza vaccines given.  Orders Placed This Encounter  Procedures  . CBC with Differential  . Comprehensive metabolic panel  . Lipid panel  . TSH

## 2014-09-24 NOTE — Assessment & Plan Note (Signed)
Followed by Dr. Vira Agar.  On Imuran. Pneumovax given today.

## 2014-09-24 NOTE — Assessment & Plan Note (Signed)
Discussed tx options. She does want to try an SSRI prior to her menstrual period every month. eRx sent for prozac 20 mg daily to start on day 14 of her cycle. She will call me in a couple of months with an update. The patient indicates understanding of these issues and agrees with the plan.

## 2014-09-24 NOTE — Addendum Note (Signed)
Addended by: Emelia Salisbury C on: 09/24/2014 12:49 PM   Modules accepted: Orders

## 2014-09-24 NOTE — Progress Notes (Signed)
Pre visit review using our clinic review tool, if applicable. No additional management support is needed unless otherwise documented below in the visit note. 

## 2014-09-25 ENCOUNTER — Encounter: Payer: Self-pay | Admitting: *Deleted

## 2014-09-25 LAB — CYTOLOGY - PAP

## 2014-09-27 ENCOUNTER — Encounter: Payer: Self-pay | Admitting: *Deleted

## 2015-07-04 ENCOUNTER — Encounter: Payer: Self-pay | Admitting: General Surgery

## 2015-07-04 ENCOUNTER — Ambulatory Visit (INDEPENDENT_AMBULATORY_CARE_PROVIDER_SITE_OTHER): Payer: BC Managed Care – PPO | Admitting: General Surgery

## 2015-07-04 ENCOUNTER — Ambulatory Visit: Payer: Self-pay | Admitting: General Surgery

## 2015-07-04 VITALS — BP 124/64 | HR 68 | Resp 12 | Ht 65.0 in | Wt 165.0 lb

## 2015-07-04 DIAGNOSIS — N6019 Diffuse cystic mastopathy of unspecified breast: Secondary | ICD-10-CM

## 2015-07-04 DIAGNOSIS — K50919 Crohn's disease, unspecified, with unspecified complications: Secondary | ICD-10-CM | POA: Diagnosis not present

## 2015-07-04 NOTE — Progress Notes (Signed)
Patient ID: Chelsea Hicks, female   DOB: 01-Jul-1966, 49 y.o.   MRN: [id number]  Chief Complaint  Patient presents with  . Follow-up    HPI Chelsea Hicks is a 49 y.o. female.  who presents for a breast evaluation. The most recent mammogram was done on 06-24-15.  Patient does perform regular self breast checks and gets regular mammograms done.  No new breast issues. Also has Crohns, under control with imuran.-has had prior rt colon resection for this. I have reviewed the history of present illness with the patient.   HPI  Past Medical History  Diagnosis Date  . Crohn's disease ([place]) 2005  . 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 Regional enteritis of unspecified site   . Diffuse cystic mastopathy 2013  . Special screening for malignant neoplasms, colon     Past Surgical History  Procedure Laterality Date  . Hemoglobin electrophinesis    . Cholecystectomy  12/2001  . Bowel rescetion  07/2009  . Colonoscopy  2011    Dr. Vira Agar  . Dilation and curettage of uterus    . Tubal ligation    . Lipoma excision  2009    from back  . Lipoma excision Left 07/18/2012    arm  . Breast biopsy Left 02-11-04    core bx    Family History  Problem Relation Age of Onset  . Cancer Maternal Grandfather     prostate  . Cancer Paternal Grandmother     breast    Social History Social History  Substance Use Topics  . Smoking status: Never Smoker   . Smokeless tobacco: Never Used  . Alcohol Use: 0.0 oz/week    0 Standard drinks or equivalent per week     Comment: occasionally    Allergies  Allergen Reactions  . Amoxicillin-Pot Clavulanate     REACTION: hives  . Penicillins Hives  . Sulfonamide Derivatives     REACTION: hives    Current Outpatient Prescriptions  Medication Sig Dispense Refill  . azathioprine (IMURAN) 100 MG tablet Take 100 mg by mouth daily.     . Cyanocobalamin (VITAMIN B 12 PO) Take by mouth daily.    . Multiple Vitamin  (MULTIVITAMIN) tablet Take 1 tablet by mouth daily.     No current facility-administered medications for this visit.    Review of Systems Review of Systems  Constitutional: Negative.   Respiratory: Negative.   Cardiovascular: Negative.     Blood pressure 124/64, pulse 68, resp. rate 12, height 5' 5"  (1.651 m), weight 165 lb (74.844 kg), last menstrual period 06/20/2015.  Physical Exam Physical Exam  Constitutional: She is oriented to person, place, and time. She appears well-developed and well-nourished.  HENT:  Mouth/Throat: Oropharynx is clear and moist.  Eyes: Conjunctivae are normal.  Neck: Neck supple.  Cardiovascular: Normal rate, regular rhythm and normal heart sounds.   Pulmonary/Chest: Effort normal and breath sounds normal. Right breast exhibits no inverted nipple, no mass, no nipple discharge, no skin change and no tenderness. Left breast exhibits no inverted nipple, no mass, no nipple discharge, no skin change and no tenderness.  Abdominal: Soft. Bowel sounds are normal. There is no tenderness.  Lymphadenopathy:    She has no cervical adenopathy.    She has no axillary adenopathy.  Neurological: She is alert and oriented to person, place, and time.  Skin: Skin is warm and dry.  Psychiatric: She has a normal mood and affect. Her behavior is  normal.    Data Reviewed Mammogram reviewed, no suspicious findings.   Assessment    Stable exam, hx of fibrocystic breast disease and remote fhx of breast cancer. Crohn's disease-stable.    Plan    Follow up in one year with bilateral screening mammogram and office visit.     PCP:  Mackey Birchwood 07/04/2015, 3:53 PM

## 2015-07-04 NOTE — Patient Instructions (Signed)
Continue self breast exams. Call office for any new breast issues or concerns. Follow up in one year for b/l screening mammogram.

## 2015-07-09 ENCOUNTER — Encounter: Payer: Self-pay | Admitting: General Surgery

## 2016-05-05 ENCOUNTER — Encounter: Payer: Self-pay | Admitting: *Deleted

## 2016-07-07 ENCOUNTER — Ambulatory Visit: Payer: BC Managed Care – PPO | Admitting: General Surgery

## 2016-07-09 ENCOUNTER — Ambulatory Visit (INDEPENDENT_AMBULATORY_CARE_PROVIDER_SITE_OTHER): Payer: BC Managed Care – PPO | Admitting: Family Medicine

## 2016-07-09 ENCOUNTER — Encounter: Payer: Self-pay | Admitting: Family Medicine

## 2016-07-09 VITALS — BP 140/80 | HR 79 | Temp 98.1°F | Ht 65.0 in | Wt 169.0 lb

## 2016-07-09 DIAGNOSIS — R21 Rash and other nonspecific skin eruption: Secondary | ICD-10-CM

## 2016-07-09 DIAGNOSIS — K5090 Crohn's disease, unspecified, without complications: Secondary | ICD-10-CM | POA: Diagnosis not present

## 2016-07-09 MED ORDER — PREDNISONE 20 MG PO TABS: ORAL_TABLET | ORAL | 0 refills | Status: DC

## 2016-07-09 NOTE — Progress Notes (Signed)
Pre visit review using our clinic review tool, if applicable. No additional management support is needed unless otherwise documented below in the visit note. 

## 2016-07-09 NOTE — Progress Notes (Signed)
Subjective:    Patient ID: Chelsea Hicks, female    DOB: [birth year], 50 y.o.   MRN: [id number]  HPI This is a 50 yo female who presents today with itchy bumps on her legs for over a month. She thought they were similar to a wart she had and applied some salisylic acid. This didn't help and she applied hydrocortisone with a little improvement. Itch started about 3 weeks ago. Now she is noticing areas on her back, upper chest and arms. She has been trying to make an appointment with her dermatologist, but has not been able to get through to his office.   Past Medical History:  Diagnosis Date  . 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 Crohn's disease ([place]) 2005  . Diffuse cystic mastopathy 2013  . Regional enteritis of unspecified site [facility])   . Special screening for malignant neoplasms, colon    Past Surgical History:  Procedure Laterality Date  . bowel rescetion  07/2009  . BREAST BIOPSY Left 02-11-04   core bx  . CHOLECYSTECTOMY  12/2001  . COLONOSCOPY  2011   Dr. Vira Agar  . DILATION AND CURETTAGE OF UTERUS    . hemoglobin electrophinesis    . LIPOMA EXCISION  2009   from back  . LIPOMA EXCISION Left 07/18/2012   arm  . TUBAL LIGATION     Family History  Problem Relation Age of Onset  . Cancer Maternal Grandfather     prostate  . Cancer Paternal Grandmother     breast   Social History  Substance Use Topics  . Smoking status: Never Smoker  . Smokeless tobacco: Never Used  . Alcohol use 0.0 oz/week     Comment: occasionally      Review of Systems Pre HPI    Objective:   Physical Exam  Constitutional: She is oriented to person, place, and time. She appears well-developed and well-nourished. No distress.  Cardiovascular: Normal rate.   Pulmonary/Chest: Effort normal.  Neurological: She is alert and oriented to person, place, and time.  Skin: Skin is warm and dry. She is not diaphoretic.  Lower legs with scattered, raised, dry and  scaly lesions 0.5 cm to 1.5 cm. Similar, but smaller lesions on arms and one on lower posterior neck and several on upper chest.   Psychiatric: She has a normal mood and affect. Her behavior is normal. Judgment and thought content normal.  Vitals reviewed.  BP 140/80   Pulse 79   Temp 98.1 F (36.7 C) (Oral)   Ht 5' 5"  (1.651 m)   Wt 169 lb (76.7 kg)   LMP 06/28/2016   SpO2 98%   BMI 28.12 kg/m  Wt Readings from Last 3 Encounters:  07/09/16 169 lb (76.7 kg)  07/04/15 165 lb (74.8 kg)  09/24/14 161 lb (73 kg)          Assessment & Plan:  Discussed with Dr. Damita Dunnings who also examined patient 1. Rash and nonspecific skin eruption - given severe itching, will treat with oral steroids, unsure etiology will refer to dermatology - predniSONE (DELTASONE) 20 MG tablet; Take 3 x 3 days then 2 x 3 days then 1 x 3 days  Dispense: 18 tablet; Refill: 0 - Ambulatory referral to Dermatology  2. Crohn's disease without complication, unspecified gastrointestinal tract location [facility]) - Ambulatory referral to Dermatology  - follow up as scheduled with Dr. Deborra Medina later this month fo rphysical Clarene Reamer, FNP-BC  [facility]  at [facility], [place]  07/09/2016 4:01 PM

## 2016-07-09 NOTE — Patient Instructions (Signed)
Prednisone tablets What is this medicine? PREDNISONE (PRED ni sone) is a corticosteroid. It is commonly used to treat inflammation of the skin, joints, lungs, and other organs. Common conditions treated include asthma, allergies, and arthritis. It is also used for other conditions, such as blood disorders and diseases of the adrenal glands. This medicine may be used for other purposes; ask your health care provider or pharmacist if you have questions. What should I tell my health care provider before I take this medicine? They need to know if you have any of these conditions: -Cushing's syndrome -diabetes -glaucoma -heart disease -high blood pressure -infection (especially a virus infection such as chickenpox, cold sores, or herpes) -kidney disease -liver disease -mental illness -myasthenia gravis -osteoporosis -seizures -stomach or intestine problems -thyroid disease -an unusual or allergic reaction to lactose, prednisone, other medicines, foods, dyes, or preservatives -pregnant or trying to get pregnant -breast-feeding How should I use this medicine? Take this medicine by mouth with a glass of water. Follow the directions on the prescription label. Take this medicine with food. If you are taking this medicine once a day, take it in the morning. Do not take more medicine than you are told to take. Do not suddenly stop taking your medicine because you may develop a severe reaction. Your doctor will tell you how much medicine to take. If your doctor wants you to stop the medicine, the dose may be slowly lowered over time to avoid any side effects. Talk to your pediatrician regarding the use of this medicine in children. Special care may be needed. Overdosage: If you think you have taken too much of this medicine contact a poison control center or emergency room at once. NOTE: This medicine is only for you. Do not share this medicine with others. What if I miss a dose? If you miss a dose,  take it as soon as you can. If it is almost time for your next dose, talk to your doctor or health care professional. You may need to miss a dose or take an extra dose. Do not take double or extra doses without advice. What may interact with this medicine? Do not take this medicine with any of the following medications: -metyrapone -mifepristone This medicine may also interact with the following medications: -aminoglutethimide -amphotericin B -aspirin and aspirin-like medicines -barbiturates -certain medicines for diabetes, like glipizide or glyburide -cholestyramine -cholinesterase inhibitors -cyclosporine -digoxin -diuretics -ephedrine -female hormones, like estrogens and birth control pills -isoniazid -ketoconazole -NSAIDS, medicines for pain and inflammation, like ibuprofen or naproxen -phenytoin -rifampin -toxoids -vaccines -warfarin This list may not describe all possible interactions. Give your health care provider a list of all the medicines, herbs, non-prescription drugs, or dietary supplements you use. Also tell them if you smoke, drink alcohol, or use illegal drugs. Some items may interact with your medicine. What should I watch for while using this medicine? Visit your doctor or health care professional for regular checks on your progress. If you are taking this medicine over a prolonged period, carry an identification card with your name and address, the type and dose of your medicine, and your doctor's name and address. This medicine may increase your risk of getting an infection. Tell your doctor or health care professional if you are around anyone with measles or chickenpox, or if you develop sores or blisters that do not heal properly. If you are going to have surgery, tell your doctor or health care professional that you have taken this medicine within the last   twelve months. Ask your doctor or health care professional about your diet. You may need to lower the amount  of salt you eat. This medicine may affect blood sugar levels. If you have diabetes, check with your doctor or health care professional before you change your diet or the dose of your diabetic medicine. What side effects may I notice from receiving this medicine? Side effects that you should report to your doctor or health care professional as soon as possible: -allergic reactions like skin rash, itching or hives, swelling of the face, lips, or tongue -changes in emotions or moods -changes in vision -depressed mood -eye pain -fever or chills, cough, sore throat, pain or difficulty passing urine -increased thirst -swelling of ankles, feet Side effects that usually do not require medical attention (report to your doctor or health care professional if they continue or are bothersome): -confusion, excitement, restlessness -headache -nausea, vomiting -skin problems, acne, thin and shiny skin -trouble sleeping -weight gain This list may not describe all possible side effects. Call your doctor for medical advice about side effects. You may report side effects to FDA at 1-800-FDA-1088. Where should I keep my medicine? Keep out of the reach of children. Store at room temperature between 15 and 30 degrees C (59 and 86 degrees F). Protect from light. Keep container tightly closed. Throw away any unused medicine after the expiration date. NOTE: This sheet is a summary. It may not cover all possible information. If you have questions about this medicine, talk to your doctor, pharmacist, or health care provider.    2016, [employer]. (2011-04-09 10:57:14)  

## 2016-07-10 ENCOUNTER — Other Ambulatory Visit: Payer: Self-pay | Admitting: Family Medicine

## 2016-07-10 DIAGNOSIS — Z01419 Encounter for gynecological examination (general) (routine) without abnormal findings: Secondary | ICD-10-CM

## 2016-07-14 ENCOUNTER — Encounter: Payer: Self-pay | Admitting: General Surgery

## 2016-07-15 ENCOUNTER — Ambulatory Visit: Payer: BC Managed Care – PPO | Admitting: General Surgery

## 2016-07-16 ENCOUNTER — Telehealth: Payer: Self-pay | Admitting: *Deleted

## 2016-07-16 ENCOUNTER — Other Ambulatory Visit (INDEPENDENT_AMBULATORY_CARE_PROVIDER_SITE_OTHER): Payer: BC Managed Care – PPO

## 2016-07-16 DIAGNOSIS — Z01419 Encounter for gynecological examination (general) (routine) without abnormal findings: Secondary | ICD-10-CM

## 2016-07-16 NOTE — Telephone Encounter (Signed)
Left message for patient to call the office back. Her appointment was scheduled on 07/21/16 to f/u from mammogram but she is needing additional views. Patient needs to get her additional views scheduled before she comes sees [place].

## 2016-07-17 LAB — CBC WITH DIFFERENTIAL/PLATELET
BASOS ABS: 0 10*3/uL (ref 0.0–0.1)
BASOS PCT: 0.4 % (ref 0.0–3.0)
EOS ABS: 0 10*3/uL (ref 0.0–0.7)
Eosinophils Relative: 0 % (ref 0.0–5.0)
HEMATOCRIT: 40 % (ref 36.0–46.0)
HEMOGLOBIN: 13.5 g/dL (ref 12.0–15.0)
LYMPHS PCT: 7.1 % — AB (ref 12.0–46.0)
Lymphs Abs: 0.9 10*3/uL (ref 0.7–4.0)
MCHC: 33.7 g/dL (ref 30.0–36.0)
MCV: 91 fl (ref 78.0–100.0)
Monocytes Absolute: 0.6 10*3/uL (ref 0.1–1.0)
Monocytes Relative: 4.6 % (ref 3.0–12.0)
Neutro Abs: 11 10*3/uL — ABNORMAL HIGH (ref 1.4–7.7)
Neutrophils Relative %: 87.9 % — ABNORMAL HIGH (ref 43.0–77.0)
Platelets: 281 10*3/uL (ref 150.0–400.0)
RBC: 4.39 Mil/uL (ref 3.87–5.11)
RDW: 14.7 % (ref 11.5–15.5)
WBC: 12.5 10*3/uL — AB (ref 4.0–10.5)

## 2016-07-17 LAB — COMPREHENSIVE METABOLIC PANEL
ALBUMIN: 4.1 g/dL (ref 3.5–5.2)
ALT: 14 U/L (ref 0–35)
AST: 13 U/L (ref 0–37)
Alkaline Phosphatase: 58 U/L (ref 39–117)
BILIRUBIN TOTAL: 0.7 mg/dL (ref 0.2–1.2)
BUN: 10 mg/dL (ref 6–23)
CALCIUM: 9.4 mg/dL (ref 8.4–10.5)
CO2: 28 mEq/L (ref 19–32)
CREATININE: 0.74 mg/dL (ref 0.40–1.20)
Chloride: 101 mEq/L (ref 96–112)
GFR: 88.25 mL/min (ref >60.00)
Glucose, Bld: 112 mg/dL — ABNORMAL HIGH (ref 70–99)
Potassium: 4.6 mEq/L (ref 3.5–5.1)
Sodium: 138 mEq/L (ref 135–145)
Total Protein: 7 g/dL (ref 6.0–8.3)

## 2016-07-17 LAB — LIPID PANEL
CHOL/HDL RATIO: 2
CHOLESTEROL: 200 mg/dL (ref 0–200)
HDL: 89.2 mg/dL (ref >39.00)
LDL Cholesterol: 80 mg/dL (ref 0–99)
NonHDL: 110.49
TRIGLYCERIDES: 154 mg/dL — AB (ref 0.0–149.0)
VLDL: 30.8 mg/dL (ref 0.0–40.0)

## 2016-07-17 LAB — TSH: TSH: 1.15 u[IU]/mL (ref 0.35–4.50)

## 2016-07-21 ENCOUNTER — Ambulatory Visit (INDEPENDENT_AMBULATORY_CARE_PROVIDER_SITE_OTHER): Payer: BC Managed Care – PPO | Admitting: Family Medicine

## 2016-07-21 ENCOUNTER — Encounter: Payer: Self-pay | Admitting: Family Medicine

## 2016-07-21 ENCOUNTER — Ambulatory Visit: Payer: BC Managed Care – PPO | Admitting: General Surgery

## 2016-07-21 VITALS — BP 118/64 | HR 61 | Temp 98.0°F | Ht 65.0 in | Wt 165.2 lb

## 2016-07-21 DIAGNOSIS — K5090 Crohn's disease, unspecified, without complications: Secondary | ICD-10-CM | POA: Insufficient documentation

## 2016-07-21 DIAGNOSIS — Z01419 Encounter for gynecological examination (general) (routine) without abnormal findings: Secondary | ICD-10-CM | POA: Diagnosis not present

## 2016-07-21 DIAGNOSIS — K50919 Crohn's disease, unspecified, with unspecified complications: Secondary | ICD-10-CM | POA: Diagnosis not present

## 2016-07-21 DIAGNOSIS — Z23 Encounter for immunization: Secondary | ICD-10-CM | POA: Diagnosis not present

## 2016-07-21 NOTE — Patient Instructions (Signed)
Great to see you! Happy Holidays.

## 2016-07-21 NOTE — Assessment & Plan Note (Signed)
On Imuran, followed by GI.

## 2016-07-21 NOTE — Progress Notes (Signed)
Pre visit review using our clinic review tool, if applicable. No additional management support is needed unless otherwise documented below in the visit note. 

## 2016-07-21 NOTE — Assessment & Plan Note (Signed)
Reviewed preventive care protocols, scheduled due services, and updated immunizations Discussed nutrition, exercise, diet, and healthy lifestyle.  

## 2016-07-21 NOTE — Progress Notes (Signed)
Subjective:   Patient ID: Chelsea Hicks, female    DOB: 11/04/1965, 50 y.o.   MRN: [id number]  Chelsea Hicks is a pleasant 50 y.o. year old female who presents to clinic today with Annual Exam  on 07/21/2016  HPI:  G3P2  Neg pap smear- 09/24/3014 (done by me). Mammogram 07/07/16- followed by Dr. Jamal Collin due to fibrocystic breasts.  Colonoscopy 12/2014 Crohn's disease, on Imuran, followed by Dr. Vira Agar. She has been in remission for years.  Went to dermatologist on Friday (Dr. Allyson Sabal).  Lab Results  Component Value Date   CHOL 200 07/16/2016   HDL 89.20 07/16/2016   LDLCALC 80 07/16/2016   LDLDIRECT 109.1 12/17/2010   TRIG 154.0 (H) 07/16/2016   CHOLHDL 2 07/16/2016   Lab Results  Component Value Date   CREATININE 0.74 07/16/2016   Lab Results  Component Value Date   WBC 12.5 (H) 07/16/2016   HGB 13.5 07/16/2016   HCT 40.0 07/16/2016   MCV 91.0 07/16/2016   PLT 281.0 07/16/2016   Lab Results  Component Value Date   NA 138 07/16/2016   K 4.6 07/16/2016   CL 101 07/16/2016   CO2 28 07/16/2016   Lab Results  Component Value Date   CREATININE 0.74 07/16/2016   Lab Results  Component Value Date   ALT 14 07/16/2016   AST 13 07/16/2016   ALKPHOS 58 07/16/2016   BILITOT 0.7 07/16/2016   Lab Results  Component Value Date   TSH 1.15 07/16/2016     Current Outpatient Prescriptions on File Prior to Visit  Medication Sig Dispense Refill  . azathioprine (IMURAN) 100 MG tablet Take 150 mg by mouth daily.    . Cyanocobalamin (VITAMIN B 12 PO) Take by mouth daily.    . Multiple Vitamin (MULTIVITAMIN) tablet Take 1 tablet by mouth daily.     No current facility-administered medications on file prior to visit.     Allergies  Allergen Reactions  . Amoxicillin-Pot Clavulanate     REACTION: hives  . Penicillins Hives  . Sulfonamide Derivatives     REACTION: hives    Past Medical History:  Diagnosis Date  . 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 Crohn's disease ([place]) 2005  . Diffuse cystic mastopathy 2013  . Regional enteritis of unspecified site   . Special screening for malignant neoplasms, colon     Past Surgical History:  Procedure Laterality Date  . bowel rescetion  07/2009  . BREAST BIOPSY Left 02-11-04   core bx  . CHOLECYSTECTOMY  12/2001  . COLONOSCOPY  2011   Dr. Vira Agar  . DILATION AND CURETTAGE OF UTERUS    . hemoglobin electrophinesis    . LIPOMA EXCISION  2009   from back  . LIPOMA EXCISION Left 07/18/2012   arm  . TUBAL LIGATION      Family History  Problem Relation Age of Onset  . Cancer Maternal Grandfather     prostate  . Cancer Paternal Grandmother     breast    Social History   Social History  . Marital status: Married    Spouse name: N/A  . Number of children: 2  . 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 Smoking status: Never Smoker  . Smokeless tobacco: Never Used  . Alcohol use 0.0 oz/week     Comment: occasionally  . Drug use: No  .  Sexual activity: Not on file   Other Topics Concern  . 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, and have been updated if relevant.   Review of Systems  Constitutional: Negative.   HENT: Negative.   Eyes: Negative.   Respiratory: Negative.   Cardiovascular: Negative.   Gastrointestinal: Negative.   Endocrine: Negative.   Genitourinary: Negative.   Musculoskeletal: Negative.   Skin: Negative.   Allergic/Immunologic: Negative.   Neurological: Negative.   Hematological: Negative.   Psychiatric/Behavioral: Negative.   All other systems reviewed and are negative.      Objective:    LMP 06/28/2016    Physical Exam    General:  Well-developed,well-nourished,in no acute distress;  alert,appropriate and cooperative throughout examination Head:  normocephalic and atraumatic.   Eyes:  vision grossly intact, PERRL Ears:  R ear normal and L ear normal externally, TMs clear bilaterally Nose:  no external deformity.   Mouth:  good dentition.   Neck:  No deformities, masses, or tenderness noted. Lungs:  Normal respiratory effort, chest expands symmetrically. Lungs are clear to auscultation, no crackles or wheezes. Heart:  Normal rate and regular rhythm. S1 and S2 normal without gallop, murmur, click, rub or other extra sounds. Abdomen:  Bowel sounds positive,abdomen soft and non-tender without masses, organomegaly or hernias noted. Msk:  No deformity or scoliosis noted of thoracic or lumbar spine.   Extremities:  No clubbing, cyanosis, edema, or deformity noted with normal full range of motion of all joints.   Neurologic:  alert & oriented X3 and gait normal.   Skin:  Intact without suspicious lesions or rashes Cervical Nodes:  No lymphadenopathy noted Axillary Nodes:  No palpable lymphadenopathy Psych:  Cognition and judgment appear intact. Alert and cooperative with normal attention span and concentration. No apparent delusions, illusions, hallucinations      Assessment & Plan:   Well woman exam with routine gynecological exam  Crohn's disease with complication, unspecified gastrointestinal tract location [facility]) No Follow-up on file.

## 2016-07-29 ENCOUNTER — Encounter: Payer: Self-pay | Admitting: General Surgery

## 2016-09-03 ENCOUNTER — Encounter: Payer: Self-pay | Admitting: *Deleted

## 2017-08-09 ENCOUNTER — Telehealth: Payer: Self-pay | Admitting: *Deleted

## 2017-08-09 NOTE — Telephone Encounter (Signed)
Patient called the office stating she missed her follow up appointment with Dr. Jamal Collin last year. She did have her mammograms completed though.   The patient wants to know if we can order her mammograms this year or does she need to have her primary care order (Dr. Deborra Medina)?  Patient is aware Dr. Jamal Collin is retiring the end of this year.

## 2017-08-10 ENCOUNTER — Telehealth: Payer: Self-pay | Admitting: *Deleted

## 2017-08-10 NOTE — Telephone Encounter (Signed)
Patient called to let us know that she is going to see if her primary can order her mammograms for her but if there is a problem in the future she is okay with seeing Dr.Byrnett. I did let the patient know that if she has any questions or concerns to let us know

## 2017-08-13 ENCOUNTER — Ambulatory Visit: Payer: BC Managed Care – PPO | Admitting: Primary Care

## 2017-08-19 ENCOUNTER — Encounter: Payer: Self-pay | Admitting: Primary Care

## 2017-08-30 ENCOUNTER — Ambulatory Visit (INDEPENDENT_AMBULATORY_CARE_PROVIDER_SITE_OTHER): Payer: BC Managed Care – PPO | Admitting: Primary Care

## 2017-08-30 ENCOUNTER — Encounter: Payer: Self-pay | Admitting: Primary Care

## 2017-08-30 ENCOUNTER — Other Ambulatory Visit (HOSPITAL_COMMUNITY)
Admission: RE | Admit: 2017-08-30 | Discharge: 2017-08-30 | Disposition: A | Discharge status: Home / Self Care | Payer: BC Managed Care – PPO | Source: Ambulatory Visit | Attending: Primary Care | Admitting: Primary Care

## 2017-08-30 VITALS — BP 118/78 | HR 73 | Temp 98.1°F | Ht 65.0 in | Wt 168.0 lb

## 2017-08-30 DIAGNOSIS — K50919 Crohn's disease, unspecified, with unspecified complications: Secondary | ICD-10-CM | POA: Insufficient documentation

## 2017-08-30 DIAGNOSIS — Z124 Encounter for screening for malignant neoplasm of cervix: Secondary | ICD-10-CM | POA: Insufficient documentation

## 2017-08-30 DIAGNOSIS — Z01419 Encounter for gynecological examination (general) (routine) without abnormal findings: Secondary | ICD-10-CM

## 2017-08-30 DIAGNOSIS — Z0000 Encounter for general adult medical examination without abnormal findings: Secondary | ICD-10-CM

## 2017-08-30 LAB — COMPREHENSIVE METABOLIC PANEL
ALBUMIN: 4 g/dL (ref 3.5–5.2)
ALK PHOS: 63 U/L (ref 39–117)
ALT: 13 U/L (ref 0–35)
AST: 15 U/L (ref 0–37)
BUN: 9 mg/dL (ref 6–23)
CO2: 28 mEq/L (ref 19–32)
CREATININE: 0.68 mg/dL (ref 0.40–1.20)
Calcium: 8.6 mg/dL (ref 8.4–10.5)
Chloride: 102 mEq/L (ref 96–112)
GFR: 96.86 mL/min (ref >60.00)
GLUCOSE: 86 mg/dL (ref 70–99)
POTASSIUM: 4.4 meq/L (ref 3.5–5.1)
SODIUM: 136 meq/L (ref 135–145)
TOTAL PROTEIN: 7 g/dL (ref 6.0–8.3)
Total Bilirubin: 0.6 mg/dL (ref 0.2–1.2)

## 2017-08-30 LAB — LIPID PANEL
CHOLESTEROL: 181 mg/dL (ref 0–200)
HDL: 72.2 mg/dL (ref >39.00)
LDL Cholesterol: 84 mg/dL (ref 0–99)
NONHDL: 109.05
Total CHOL/HDL Ratio: 3
Triglycerides: 124 mg/dL (ref 0.0–149.0)
VLDL: 24.8 mg/dL (ref 0.0–40.0)

## 2017-08-30 NOTE — Progress Notes (Signed)
Subjective:    Patient ID: Chelsea Hicks, female    DOB: September 24, 1965, 51 y.o.   MRN: [id number]  HPI  Chelsea Hicks is a 51 year old female who presents today to transfer care from Dr. Deborra Hicks and also needs physical and Pap Smear.  1) Crohn's Disease: Diagnosed years ago. Currently managed on azathioprine 100 mg. Currently following with GI at [facility] and will be following once annually. She is also following with dermatology.   Immunizations: -Tetanus: Completed in 2012 -Influenza: Completed this season   Diet: She endorses a healthy diet. Breakfast: Granola bar, yogurt Lunch: Meat, vegetable, left overs Dinner: Restaurants, meat, vegetable Snacks: Nuts, cookies Desserts: Daily Beverages: 1-2 bottles of water, diet soda  Exercise: She does not currently exercise. Eye exam: Completed 2 years ago Dental exam: Completes semi-annually Colonoscopy: Completed in 2016. Pap Smear: Completed in 2016, due today. Mammogram: Completed in 2018   Review of Systems  Constitutional: Negative for unexpected weight change.  HENT: Negative for rhinorrhea.   Respiratory: Negative for cough and shortness of breath.   Cardiovascular: Negative for chest pain.  Gastrointestinal: Negative for constipation and diarrhea.  Genitourinary: Negative for difficulty urinating and menstrual problem.  Musculoskeletal: Negative for arthralgias and myalgias.  Skin: Negative for rash.  Allergic/Immunologic: Negative for environmental allergies.  Neurological: Negative for dizziness, numbness and headaches.  Psychiatric/Behavioral:       Denies concerns for anxiety and depression       Past Medical History:  Diagnosis Date  . 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 Crohn's disease ([place]) 2005  . Diffuse cystic mastopathy 2013  . Lichen simplex chronicus   . Regional enteritis of unspecified site   . Special screening for malignant neoplasms, colon      Social  History   Socioeconomic History  . Marital status: Married    Spouse name: Not on file  . Number of children: 2  . Years of education: Not on file  . Highest education level: Not on file  Social Needs  . Financial resource strain: Not on file  . Food insecurity - worry: Not on file  . Food insecurity - inability: Not on file  . Transportation needs - medical: Not on file  . Transportation needs - non-medical: Not on file  Occupational History  . 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 Smoking status: Never Smoker  . Smokeless tobacco: Never Used  Substance and Sexual Activity  . Alcohol use: Yes    Alcohol/week: 0.0 oz    Comment: occasionally  . Drug use: No  . Sexual activity: Not on file  Other Topics Concern  . Not on file  Social History Narrative   Attending [employer] getting Undergraduate in Early Childhood    Past Surgical History:  Procedure Laterality Date  . bowel rescetion  07/2009  . BREAST BIOPSY Left 02-11-04   core bx  . CHOLECYSTECTOMY  12/2001  . COLONOSCOPY  2011   Chelsea Hicks  . DILATION AND CURETTAGE OF UTERUS    . hemoglobin electrophinesis    . LIPOMA EXCISION  2009   from back  . LIPOMA EXCISION Left 07/18/2012   arm  . TUBAL LIGATION      Family History  Problem Relation Age of Onset  . Cancer Maternal Grandfather        prostate  . Cancer Paternal Grandmother        breast    Allergies  Allergen Reactions  . Amoxicillin-Pot Clavulanate     REACTION: hives  . Penicillins Hives  . Sulfonamide Derivatives     REACTION: hives    Current Outpatient Medications on File Prior to Visit  Medication Sig Dispense Refill  . azathioprine (IMURAN) 100 MG tablet Take 150 mg by mouth daily.    . Cyanocobalamin (VITAMIN B 12 PO) Take by mouth daily.    . Multiple Vitamin (MULTIVITAMIN) tablet Take 1 tablet by mouth daily.     No current facility-administered medications on file prior to visit.     BP 118/78   Pulse 73   Temp 98.1  F (36.7 C) (Oral)   Ht 5' 5"  (1.651 m)   Wt 168 lb (76.2 kg)   LMP 08/16/2017   SpO2 99%   BMI 27.96 kg/m    Objective:   Physical Exam  Constitutional: She is oriented to person, place, and time. She appears well-nourished.  HENT:  Right Ear: Tympanic membrane and ear canal normal.  Left Ear: Tympanic membrane and ear canal normal.  Nose: Nose normal.  Mouth/Throat: Oropharynx is clear and moist.  Eyes: Conjunctivae and EOM are normal. Pupils are equal, round, and reactive to light.  Neck: Neck supple. No thyromegaly present.  Cardiovascular: Normal rate and regular rhythm.  No murmur heard. Pulmonary/Chest: Effort normal and breath sounds normal. She has no rales.  Abdominal: Soft. Bowel sounds are normal. There is no tenderness.  Genitourinary: There is no tenderness or lesion on the right labia. There is no tenderness or lesion on the left labia. Cervix exhibits no motion tenderness and no discharge. Right adnexum displays no tenderness. Left adnexum displays no tenderness. No vaginal discharge found.  Musculoskeletal: Normal range of motion.  Lymphadenopathy:    She has no cervical adenopathy.  Neurological: She is alert and oriented to person, place, and time. She has normal reflexes. No cranial nerve deficit.  Skin: Skin is warm and dry. No rash noted.  Psychiatric: She has a normal mood and affect.          Assessment & Plan:

## 2017-08-30 NOTE — Assessment & Plan Note (Signed)
Immunizations UTD. Pap due, completed today. Colonoscopy UTD. Mammogram UTD. Exam unremarkable. Labs pending. Discussed the importance of a healthy diet and regular exercise in order for weight loss, and to reduce the risk of any potential medical problems. Follow up in 1 year.

## 2017-08-30 NOTE — Patient Instructions (Addendum)
Try benadryl or dramamine to use before flights.   Start exercising. You should be getting 150 minutes of moderate intensity exercise weekly.  Ensure you are consuming 64 ounces of water daily.  Increase consumption of fruit, vegetables, whole grains.  We will notify you of your pap results once received.  Follow up in 1 year for your annual exam or sooner if needed.  It was a pleasure to see you today!

## 2017-08-30 NOTE — Addendum Note (Signed)
Addended by: Jacqualin Combes on: 08/30/2017 11:09 AM   Modules accepted: Orders

## 2017-08-30 NOTE — Assessment & Plan Note (Signed)
Following with GI annually. No recent flares.  Feels well managed.

## 2017-09-02 LAB — CYTOLOGY - PAP
DIAGNOSIS: NEGATIVE
HPV: NOT DETECTED

## 2017-09-16 ENCOUNTER — Telehealth: Payer: Self-pay | Admitting: Primary Care

## 2017-09-16 MED ORDER — OSELTAMIVIR PHOSPHATE 75 MG PO CAPS: 75.0000 mg | ORAL_CAPSULE | Freq: Every day | ORAL | 0 refills | Status: DC

## 2017-09-16 NOTE — Telephone Encounter (Signed)
Copied from [place] [id number]. Topic: Quick Communication - See Telephone Encounter >> Sep 16, 2017  3:49 PM Vernona Rieger wrote: CRM for notification. See Telephone encounter for:   09/16/17.  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   Pharmacy [facility] [place] Call back is [phone]

## 2017-09-16 NOTE — Telephone Encounter (Signed)
[place] sent for prophylactic dose of Tamiflu to pharmacy.

## 2017-09-17 NOTE — Telephone Encounter (Signed)
LMOVM that TA sent in what she req to the pharm she specified/thx dmf

## 2017-09-24 ENCOUNTER — Encounter: Payer: Self-pay | Admitting: Primary Care

## 2018-05-02 ENCOUNTER — Telehealth: Payer: Self-pay

## 2018-05-02 NOTE — Telephone Encounter (Signed)
No charge for form completion. The information put on the form would be based off of her CPE from December 2018.

## 2018-05-02 NOTE — Telephone Encounter (Deleted)
Copied from [place] [id number]. Topic: General - Other >> May 02, 2018  4:37 PM Bea Graff, NT wrote: Reason for CRM: Pt would like to know how much if anything that [facility] would charge to fill out a health assessment form.

## 2018-05-02 NOTE — Telephone Encounter (Signed)
Copied from [place] [id number]. Topic: General - Other >> May 02, 2018  4:37 PM Bea Graff, NT wrote: Reason for CRM: Pt would like to know how much if anything that [facility] would charge to fill out a health assessment form.

## 2018-05-03 ENCOUNTER — Telehealth: Payer: Self-pay

## 2018-05-03 NOTE — Telephone Encounter (Signed)
Message left for patient to return my call.  

## 2018-05-03 NOTE — Telephone Encounter (Signed)
Completed form, placed in Chan's inbox.

## 2018-05-03 NOTE — Telephone Encounter (Signed)
Patient dropped off form to be filled out, see message in the chart from 05/03/18. Please call patient when ready [phone]. Placed form in [employer]. Thank Edrick Kins, RMA

## 2018-05-03 NOTE — Telephone Encounter (Signed)
Patient view message on MyChart.

## 2018-05-04 NOTE — Telephone Encounter (Signed)
Left message for patient that form is ready for pick up. Left in the front office.

## 2018-06-03 ENCOUNTER — Other Ambulatory Visit
Admission: RE | Admit: 2018-06-03 | Discharge: 2018-06-03 | Disposition: A | Discharge status: Home / Self Care | Payer: BC Managed Care – PPO | Source: Ambulatory Visit | Attending: Nurse Practitioner | Admitting: Nurse Practitioner

## 2018-06-03 DIAGNOSIS — K50812 Crohn's disease of both small and large intestine with intestinal obstruction: Secondary | ICD-10-CM | POA: Diagnosis present

## 2018-06-03 LAB — LACTOFERRIN, FECAL, QUALITATIVE: Lactoferrin, Fecal, Qual: NEGATIVE

## 2018-06-06 LAB — CALPROTECTIN, FECAL: Calprotectin, Fecal: <16 ug/g (ref 0–120)

## 2018-06-29 DIAGNOSIS — K50919 Crohn's disease, unspecified, with unspecified complications: Secondary | ICD-10-CM

## 2018-06-29 DIAGNOSIS — Z1239 Encounter for other screening for malignant neoplasm of breast: Secondary | ICD-10-CM

## 2018-07-08 ENCOUNTER — Telehealth: Payer: Self-pay | Admitting: Primary Care

## 2018-07-08 NOTE — Telephone Encounter (Signed)
Left message asking pt to call office regarding mammogram/bone density

## 2018-07-11 ENCOUNTER — Telehealth: Payer: Self-pay | Admitting: Primary Care

## 2018-07-11 NOTE — Telephone Encounter (Signed)
Left message asking pt to call office regarding mammogram and bone density. Last mammogram was done @ [facility].  When I spoke with pt she wanted [facility].  If pt wants [facility] they will get records from [facility] and call pt to schedule.

## 2018-08-22 ENCOUNTER — Other Ambulatory Visit: Payer: Self-pay | Admitting: Primary Care

## 2018-08-22 DIAGNOSIS — D539 Nutritional anemia, unspecified: Secondary | ICD-10-CM

## 2018-08-22 DIAGNOSIS — Z0000 Encounter for general adult medical examination without abnormal findings: Secondary | ICD-10-CM

## 2018-08-29 ENCOUNTER — Other Ambulatory Visit (INDEPENDENT_AMBULATORY_CARE_PROVIDER_SITE_OTHER): Payer: BC Managed Care – PPO

## 2018-08-29 ENCOUNTER — Encounter: Payer: Self-pay | Admitting: Primary Care

## 2018-08-29 ENCOUNTER — Other Ambulatory Visit: Payer: Self-pay | Admitting: Primary Care

## 2018-08-29 DIAGNOSIS — Z0000 Encounter for general adult medical examination without abnormal findings: Secondary | ICD-10-CM | POA: Diagnosis not present

## 2018-08-29 DIAGNOSIS — D539 Nutritional anemia, unspecified: Secondary | ICD-10-CM | POA: Diagnosis not present

## 2018-08-29 LAB — CBC
HEMATOCRIT: 41.9 % (ref 36.0–46.0)
HEMOGLOBIN: 14.4 g/dL (ref 12.0–15.0)
MCHC: 34.4 g/dL (ref 30.0–36.0)
MCV: 91 fl (ref 78.0–100.0)
Platelets: 254 10*3/uL (ref 150.0–400.0)
RBC: 4.6 Mil/uL (ref 3.87–5.11)
RDW: 14.1 % (ref 11.5–15.5)
WBC: 6 10*3/uL (ref 4.0–10.5)

## 2018-08-29 LAB — LIPID PANEL
CHOLESTEROL: 176 mg/dL (ref 0–200)
HDL: 75.3 mg/dL (ref >39.00)
LDL CALC: 83 mg/dL (ref 0–99)
NonHDL: 100.22
TRIGLYCERIDES: 88 mg/dL (ref 0.0–149.0)
Total CHOL/HDL Ratio: 2
VLDL: 17.6 mg/dL (ref 0.0–40.0)

## 2018-08-29 LAB — COMPREHENSIVE METABOLIC PANEL
ALBUMIN: 3.9 g/dL (ref 3.5–5.2)
ALK PHOS: 50 U/L (ref 39–117)
ALT: 10 U/L (ref 0–35)
AST: 13 U/L (ref 0–37)
BUN: 9 mg/dL (ref 6–23)
CALCIUM: 9.1 mg/dL (ref 8.4–10.5)
CHLORIDE: 104 meq/L (ref 96–112)
CO2: 27 mEq/L (ref 19–32)
CREATININE: 0.73 mg/dL (ref 0.40–1.20)
GFR: 88.89 mL/min (ref >60.00)
Glucose, Bld: 91 mg/dL (ref 70–99)
POTASSIUM: 4 meq/L (ref 3.5–5.1)
Sodium: 139 mEq/L (ref 135–145)
Total Bilirubin: 0.7 mg/dL (ref 0.2–1.2)
Total Protein: 6.8 g/dL (ref 6.0–8.3)

## 2018-09-05 ENCOUNTER — Ambulatory Visit
Admission: RE | Admit: 2018-09-05 | Discharge: 2018-09-05 | Disposition: A | Discharge status: Home / Self Care | Payer: BC Managed Care – PPO | Source: Ambulatory Visit | Attending: Primary Care | Admitting: Primary Care

## 2018-09-05 DIAGNOSIS — K50919 Crohn's disease, unspecified, with unspecified complications: Secondary | ICD-10-CM | POA: Insufficient documentation

## 2018-09-05 DIAGNOSIS — Z1239 Encounter for other screening for malignant neoplasm of breast: Secondary | ICD-10-CM | POA: Insufficient documentation

## 2018-09-05 IMAGING — MG DIGITAL SCREENING BILATERAL MAMMOGRAM WITH TOMO AND CAD
8 series · 8 of 24 positions shown · non-contrast
Comparison: Previous exam(s).

CLINICAL DATA: Screening.

EXAM:
DIGITAL SCREENING BILATERAL MAMMOGRAM WITH TOMO AND CAD

[L CC synth-2D]
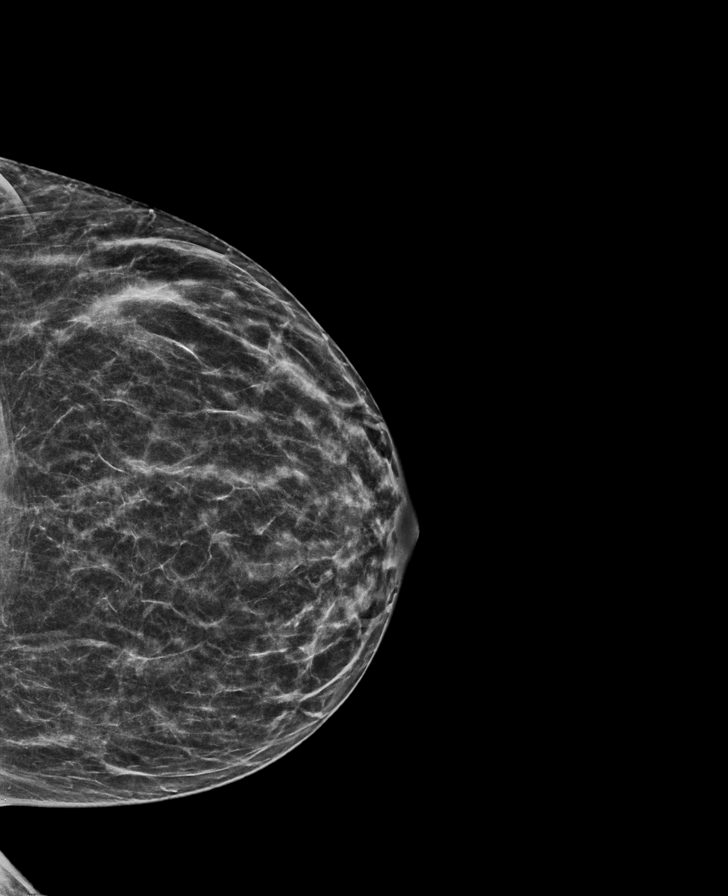

[L MLO synth-2D]
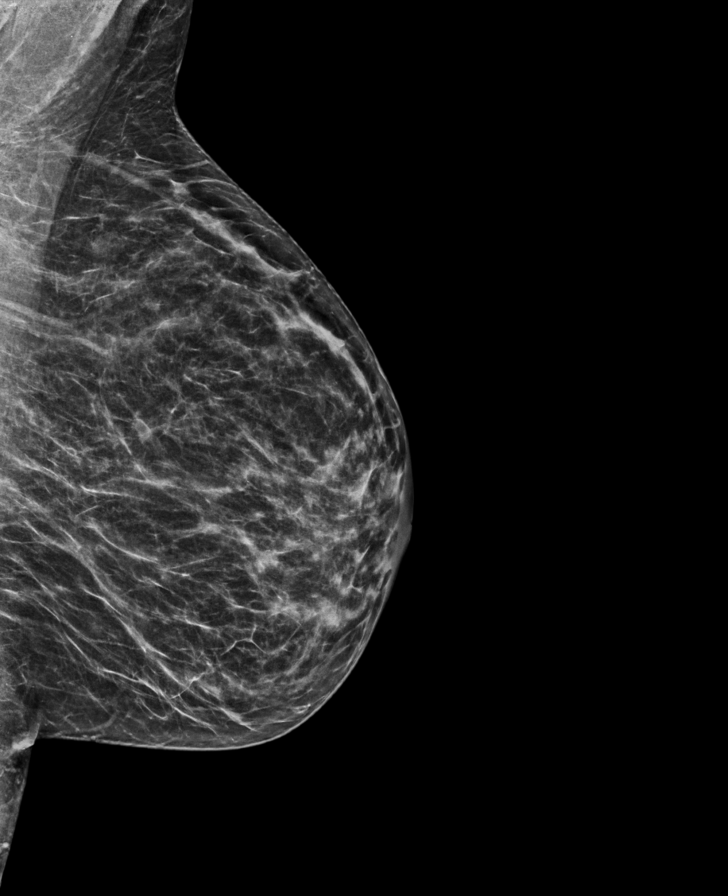

[R MLO synth-2D]
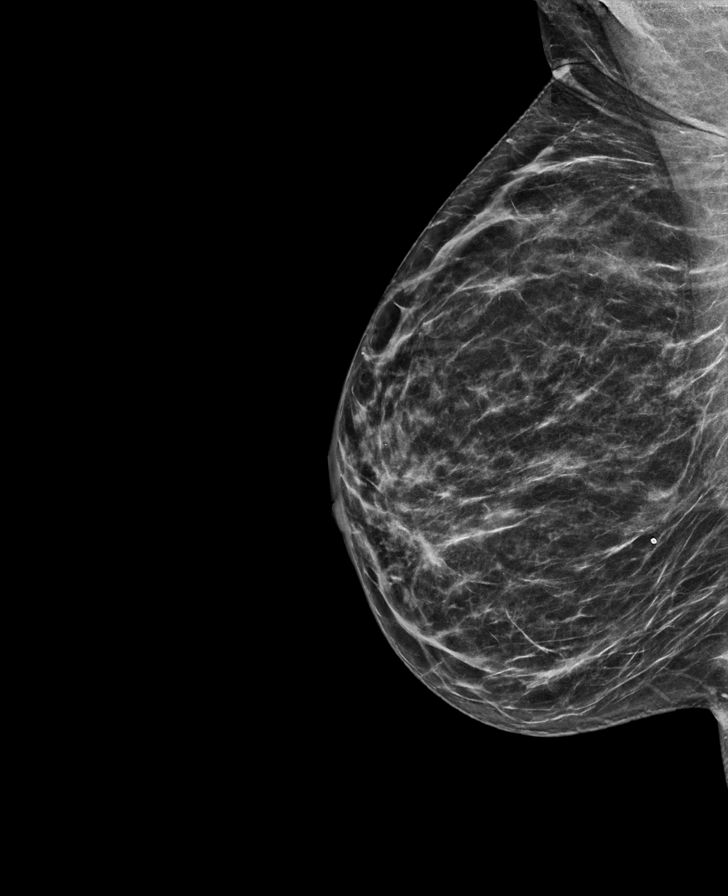

[R CC synth-2D]
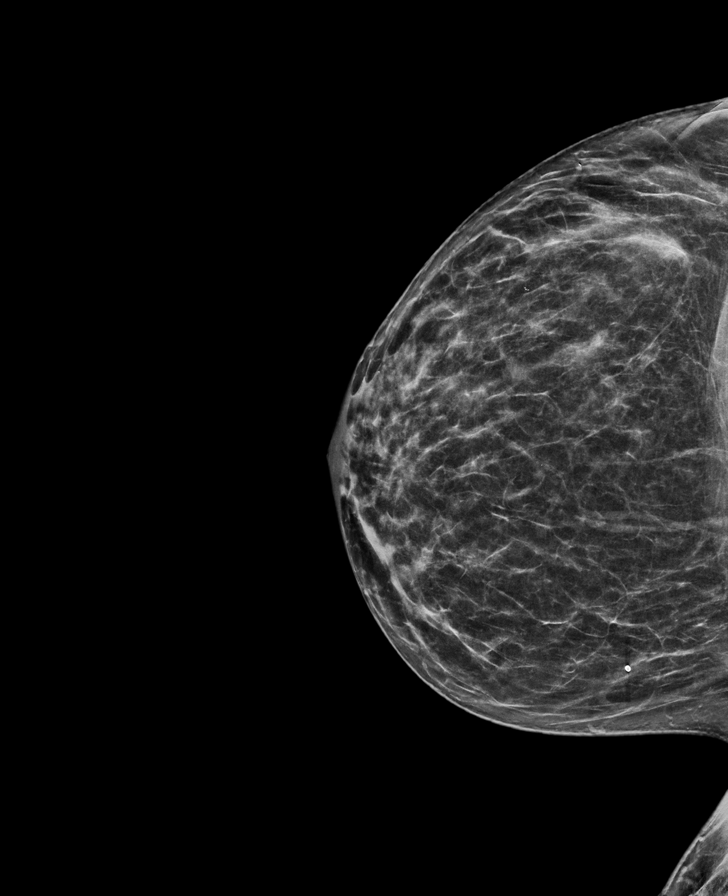

[L CC tomo · tomo slice 29/57.0]
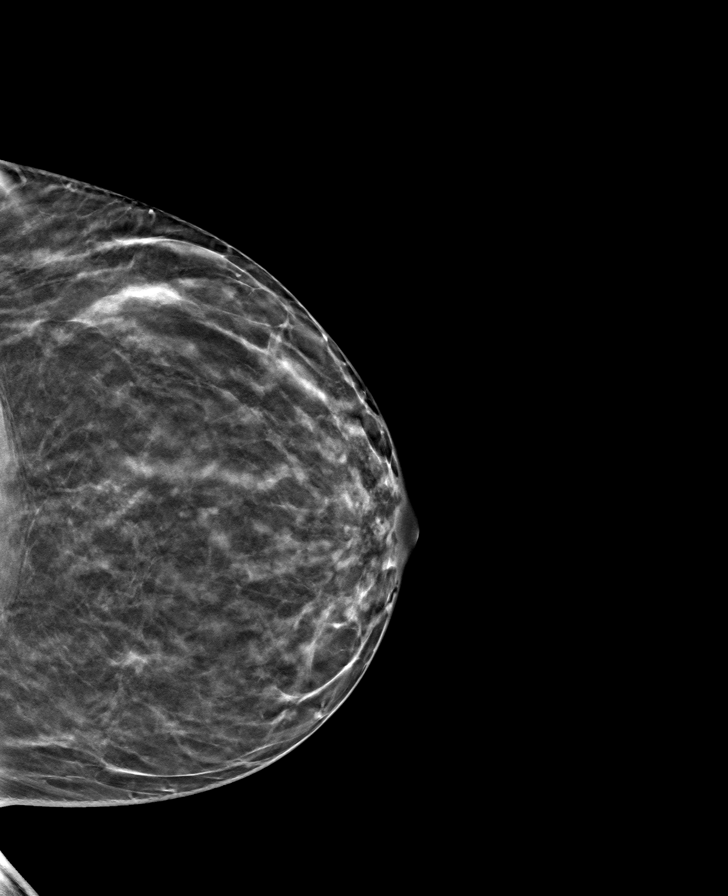

[R MLO tomo · tomo slice 31/61.0]
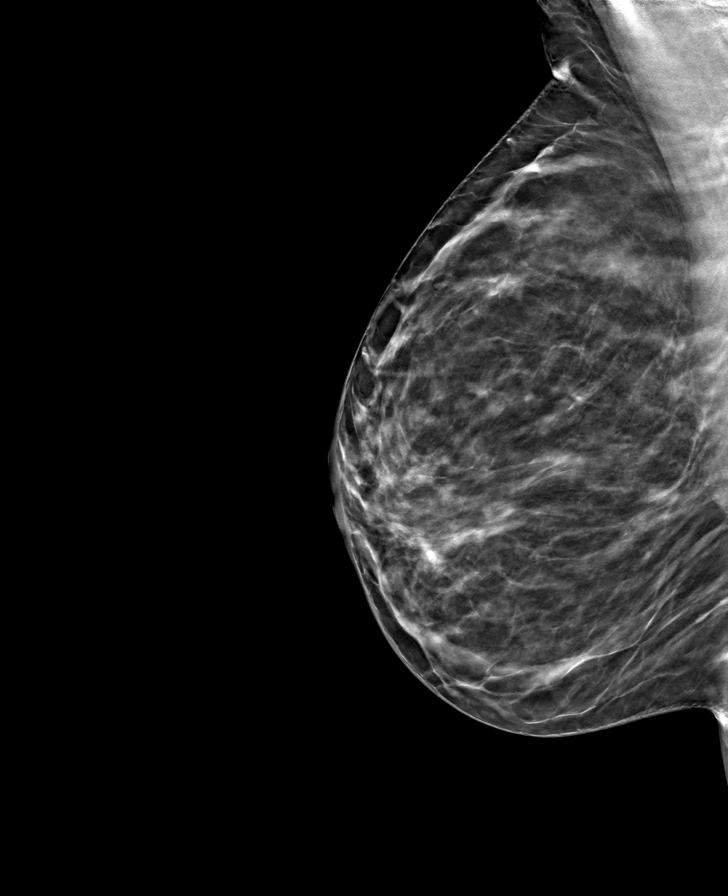

[R CC tomo · tomo slice 31/62.0]
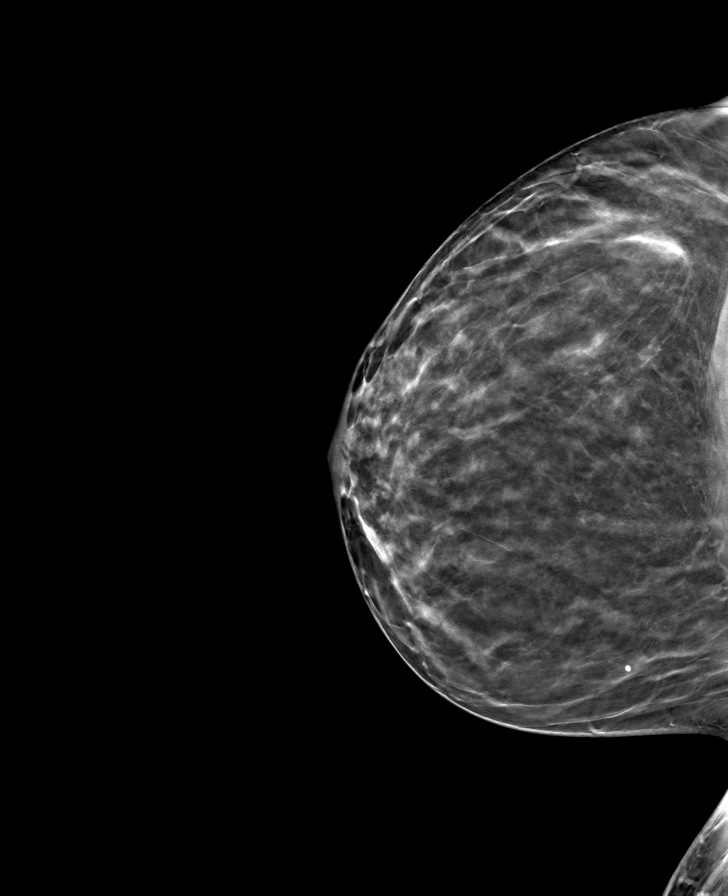

[L MLO tomo · tomo slice 29/58.0]
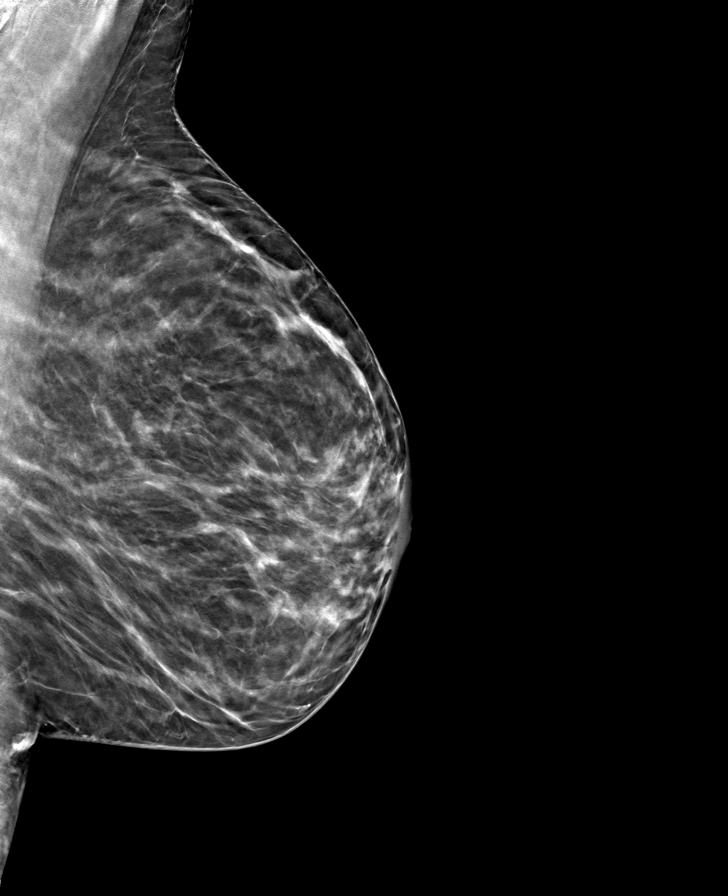

[8 of 24 positions shown; findings below may reference images not displayed]

ACR Breast Density Category b: There are scattered areas of
fibroglandular density.
FINDINGS: There are no findings suspicious for malignancy. Images were
processed with CAD.
IMPRESSION: No mammographic evidence of malignancy. A result letter of this
screening mammogram will be mailed directly to the patient.

RECOMMENDATION:
Screening mammogram in one year. (Code:[id number])

BI-RADS CATEGORY  1: Negative.

## 2018-09-09 ENCOUNTER — Ambulatory Visit (INDEPENDENT_AMBULATORY_CARE_PROVIDER_SITE_OTHER): Payer: BC Managed Care – PPO | Admitting: Primary Care

## 2018-09-09 ENCOUNTER — Other Ambulatory Visit (HOSPITAL_COMMUNITY)
Admission: RE | Admit: 2018-09-09 | Discharge: 2018-09-09 | Disposition: A | Discharge status: Home / Self Care | Payer: BC Managed Care – PPO | Source: Ambulatory Visit | Attending: Primary Care | Admitting: Primary Care

## 2018-09-09 ENCOUNTER — Encounter: Payer: Self-pay | Admitting: Primary Care

## 2018-09-09 VITALS — BP 116/74 | HR 68 | Temp 98.0°F | Ht 65.0 in | Wt 156.2 lb

## 2018-09-09 DIAGNOSIS — K50919 Crohn's disease, unspecified, with unspecified complications: Secondary | ICD-10-CM | POA: Diagnosis not present

## 2018-09-09 DIAGNOSIS — Z0000 Encounter for general adult medical examination without abnormal findings: Secondary | ICD-10-CM | POA: Diagnosis not present

## 2018-09-09 DIAGNOSIS — Z124 Encounter for screening for malignant neoplasm of cervix: Secondary | ICD-10-CM | POA: Diagnosis not present

## 2018-09-09 DIAGNOSIS — N6019 Diffuse cystic mastopathy of unspecified breast: Secondary | ICD-10-CM | POA: Diagnosis not present

## 2018-09-09 DIAGNOSIS — D539 Nutritional anemia, unspecified: Secondary | ICD-10-CM | POA: Diagnosis not present

## 2018-09-09 NOTE — Progress Notes (Signed)
Subjective:    Patient ID: Chelsea Hicks, female    DOB: Oct 30, 1965, 53 y.o.   MRN: [id number]  HPI  Chelsea Hicks is a 53 year old female who presents today for complete physical.  Immunizations: -Tetanus: Completed in 2012 -Influenza: Completed this season  -Pneumonia: Completed in 2016  Diet: She endorses a fair diet. Breakfast: Yogurt, granola bar Lunch: Chicken, burgers Dinner: Meat, vegetables  Snacks: Nuts, crackers Desserts: Daily  Beverages: Water, diet soda  Exercise: She is not exercising Eye exam: No recent exam Dental exam: Completes semi-annually  Colonoscopy: Completed in 2019 Pap Smear: Negative in 2018 Mammogram: Completed in December 2019  Wt Readings from Last 3 Encounters:  09/09/18 156 lb 4 oz (70.9 kg)  08/30/17 168 lb (76.2 kg)  07/21/16 165 lb 4 oz (75 kg)      Review of Systems  Constitutional: Negative for unexpected weight change.  HENT: Negative for rhinorrhea.   Respiratory: Negative for cough and shortness of breath.   Cardiovascular: Negative for chest pain.  Gastrointestinal: Negative for constipation and diarrhea.  Genitourinary: Negative for difficulty urinating.       Menstrual cycles every other month on average  Musculoskeletal:       Intermittent left hip pain  Skin: Negative for rash.  Allergic/Immunologic: Negative for environmental allergies.  Neurological: Negative for dizziness, numbness and headaches.  Psychiatric/Behavioral: The patient is not nervous/anxious.        Past Medical History:  Diagnosis Date  . 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 Crohn's disease ([place]) 2005  . Diffuse cystic mastopathy 2013  . Hypertrophic lichen planus   . Lichen simplex chronicus   . Regional enteritis of unspecified site   . Special screening for malignant neoplasms, colon      Social History   Socioeconomic History  . Marital status: Married    Spouse name: Not on file  . Number of  children: 2  . Years of education: Not on file  . Highest education level: Not on file  Occupational History  . 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 Financial resource strain: Not on file  . Food insecurity:    Worry: Not on file    Inability: Not on file  . Transportation needs:    Medical: Not on file    Non-medical: Not on file  Tobacco Use  . Smoking status: Never Smoker  . Smokeless tobacco: Never Used  Substance and Sexual Activity  . Alcohol use: Yes    Alcohol/week: 0.0 standard drinks    Comment: occasionally  . Drug use: No  . Sexual activity: Not on file  Lifestyle  . Physical activity:    Days per week: Not on file    Minutes per session: Not on file  . Stress: Not on file  Relationships  . Social connections:    Talks on phone: Not on file    Gets together: Not on file    Attends religious service: Not on file    Active member of club or organization: Not on file    Attends meetings of clubs or organizations: Not on file    Relationship status: Not on file  . Intimate partner violence:    Fear of current or ex partner: Not on file    Emotionally abused: Not on file    Physically abused: Not on file    Forced sexual activity: Not on file  Other Topics Concern  .  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 bowel rescetion  07/2009  . BREAST BIOPSY Left 02-11-04   core bx,benign  . CHOLECYSTECTOMY  12/2001  . COLONOSCOPY  2011   Dr. Vira Agar  . DILATION AND CURETTAGE OF UTERUS    . hemoglobin electrophinesis    . LIPOMA EXCISION  2009   from back  . LIPOMA EXCISION Left 07/18/2012   arm  . TUBAL LIGATION      Family History  Problem Relation Age of Onset  . Cancer Maternal Grandfather        prostate  . Cancer Paternal Grandmother        breast  . Breast cancer Paternal Grandmother     Allergies  Allergen Reactions  .  Amoxicillin-Pot Clavulanate     REACTION: hives  . Penicillins Hives  . Sulfonamide Derivatives     REACTION: hives    Current Outpatient Medications on File Prior to Visit  Medication Sig Dispense Refill  . azathioprine (IMURAN) 100 MG tablet Take 150 mg by mouth daily.    . Iron-Vitamin C (IRON 100/C) 100-250 MG TABS     . Multiple Vitamin (MULTIVITAMIN) tablet Take 1 tablet by mouth daily.    Marland Kitchen VITAMIN D PO Take by mouth.     No current facility-administered medications on file prior to visit.     BP 116/74   Pulse 68   Temp 98 F (36.7 C) (Oral)   Ht 5' 5"  (1.651 m)   Wt 156 lb 4 oz (70.9 kg)   LMP 09/09/2018   SpO2 98%   BMI 26.00 kg/m    Objective:   Physical Exam  Constitutional: She is oriented to person, place, and time. She appears well-nourished.  HENT:  Mouth/Throat: No oropharyngeal exudate.  Eyes: Pupils are equal, round, and reactive to light. EOM are normal.  Neck: Neck supple. No thyromegaly present.  Cardiovascular: Normal rate and regular rhythm.  Respiratory: Effort normal and breath sounds normal.  GI: Soft. Bowel sounds are normal. There is no abdominal tenderness.  Genitourinary: There is no tenderness or lesion on the right labia. There is no tenderness or lesion on the left labia. Cervix exhibits no motion tenderness and no discharge. Right adnexum displays no tenderness. Left adnexum displays no tenderness.    Vaginal bleeding present.     No vaginal discharge or erythema.  There is bleeding in the vagina. No erythema in the vagina.    Genitourinary Comments: Mild bleeding on exam, she is on her menstrual cycle.   Musculoskeletal: Normal range of motion.  Neurological: She is alert and oriented to person, place, and time.  Skin: Skin is warm and dry.  Psychiatric: She has a normal mood and affect.           Assessment & Plan:

## 2018-09-09 NOTE — Assessment & Plan Note (Signed)
Normal CBC on recent labs.

## 2018-09-09 NOTE — Assessment & Plan Note (Signed)
Chronic. Following with GI. Bone density scan unremarkable.  Continue current regimen.

## 2018-09-09 NOTE — Addendum Note (Signed)
Addended by: Jacqualin Combes on: 09/09/2018 04:25 PM   Modules accepted: Orders

## 2018-09-09 NOTE — Addendum Note (Signed)
Addended by: Jacqualin Combes on: 09/09/2018 04:23 PM   Modules accepted: Orders

## 2018-09-09 NOTE — Assessment & Plan Note (Signed)
Immunizations UTD. Pap smear due annually, completed today. Colonoscopy UTD. Recommended regular exercise, healthy diet. Exam unremarkable.  Labs reviewed. Follow up in 1 year for CPE.

## 2018-09-09 NOTE — Patient Instructions (Signed)
Stop by the lab prior to leaving today. I will notify you of your results once received.   We will be in touch once we receive your pap smear results.  Start exercising. You should be getting 150 minutes of moderate intensity exercise weekly.  Continue to work on a healthy diet.  Ensure you are consuming 64 ounces of water daily.  We will see you in one year for your annual exam or sooner if needed.  It was a pleasure to see you today!   Preventive Care 40-64 Years, Female Preventive care refers to lifestyle choices and visits with your health care provider that can promote health and wellness. What does preventive care include?   A yearly physical exam. This is also called an annual well check.  Dental exams once or twice a year.  Routine eye exams. Ask your health care provider how often you should have your eyes checked.  Personal lifestyle choices, including: ? Daily care of your teeth and gums. ? Regular physical activity. ? Eating a healthy diet. ? Avoiding tobacco and drug use. ? Limiting alcohol use. ? Practicing safe sex. ? Taking low-dose aspirin daily starting at age 28. ? Taking vitamin and mineral supplements as recommended by your health care provider. What happens during an annual well check? The services and screenings done by your health care provider during your annual well check will depend on your age, overall health, lifestyle risk factors, and family history of disease. Counseling Your health care provider may ask you questions about your:  Alcohol use.  Tobacco use.  Drug use.  Emotional well-being.  Home and relationship well-being.  Sexual activity.  Eating habits.  Work and work Statistician.  Method of birth control.  Menstrual cycle.  Pregnancy history. Screening You may have the following tests or measurements:  Height, weight, and BMI.  Blood pressure.  Lipid and cholesterol levels. These may be checked every 5 years, or  more frequently if you are over 32 years old.  Skin check.  Lung cancer screening. You may have this screening every year starting at age 33 if you have a 30-pack-year history of smoking and currently smoke or have quit within the past 15 years.  Colorectal cancer screening. All adults should have this screening starting at age 35 and continuing until age 25. Your health care provider may recommend screening at age 30. You will have tests every 1-10 years, depending on your results and the type of screening test. People at increased risk should start screening at an earlier age. Screening tests may include: ? Guaiac-based fecal occult blood testing. ? Fecal immunochemical test (FIT). ? Stool DNA test. ? Virtual colonoscopy. ? Sigmoidoscopy. During this test, a flexible tube with a tiny camera (sigmoidoscope) is used to examine your rectum and lower colon. The sigmoidoscope is inserted through your anus into your rectum and lower colon. ? Colonoscopy. During this test, a long, thin, flexible tube with a tiny camera (colonoscope) is used to examine your entire colon and rectum.  Hepatitis C blood test.  Hepatitis B blood test.  Sexually transmitted disease (STD) testing.  Diabetes screening. This is done by checking your blood sugar (glucose) after you have not eaten for a while (fasting). You may have this done every 1-3 years.  Mammogram. This may be done every 1-2 years. Talk to your health care provider about when you should start having regular mammograms. This may depend on whether you have a family history of breast cancer.  BRCA-related cancer screening. This may be done if you have a family history of breast, ovarian, tubal, or peritoneal cancers.  Pelvic exam and Pap test. This may be done every 3 years starting at age 7. Starting at age 55, this may be done every 5 years if you have a Pap test in combination with an HPV test.  Bone density scan. This is done to screen for  osteoporosis. You may have this scan if you are at high risk for osteoporosis. Discuss your test results, treatment options, and if necessary, the need for more tests with your health care provider. Vaccines Your health care provider may recommend certain vaccines, such as:  Influenza vaccine. This is recommended every year.  Tetanus, diphtheria, and acellular pertussis (Tdap, Td) vaccine. You may need a Td booster every 10 years.  Varicella vaccine. You may need this if you have not been vaccinated.  Zoster vaccine. You may need this after age 38.  Measles, mumps, and rubella (MMR) vaccine. You may need at least one dose of MMR if you were born in 1957 or later. You may also need a second dose.  Pneumococcal 13-valent conjugate (PCV13) vaccine. You may need this if you have certain conditions and were not previously vaccinated.  Pneumococcal polysaccharide (PPSV23) vaccine. You may need one or two doses if you smoke cigarettes or if you have certain conditions.  Meningococcal vaccine. You may need this if you have certain conditions.  Hepatitis A vaccine. You may need this if you have certain conditions or if you travel or work in places where you may be exposed to hepatitis A.  Hepatitis B vaccine. You may need this if you have certain conditions or if you travel or work in places where you may be exposed to hepatitis B.  Haemophilus influenzae type b (Hib) vaccine. You may need this if you have certain conditions. Talk to your health care provider about which screenings and vaccines you need and how often you need them. This information is not intended to replace advice given to you by your health care provider. Make sure you discuss any questions you have with your health care provider. Document Released: 09/20/2015 Document Revised: 10/14/2017 Document Reviewed: 06/25/2015 Elsevier Interactive Patient Education  2019 [employer].

## 2018-09-09 NOTE — Assessment & Plan Note (Signed)
Recent mammogram pending.

## 2018-09-10 LAB — TSH: TSH: 1.45 mIU/L

## 2018-09-11 ENCOUNTER — Telehealth: Payer: Self-pay | Admitting: Primary Care

## 2018-09-11 NOTE — Telephone Encounter (Signed)
Chelsea Hicks, her mammogram hasn't resulted from 09/05/18. Can it take this long for a read?

## 2018-09-12 ENCOUNTER — Other Ambulatory Visit: Payer: BC Managed Care – PPO

## 2018-09-12 NOTE — Telephone Encounter (Signed)
Noted  

## 2018-09-12 NOTE — Telephone Encounter (Signed)
Chelsea Hicks  I spoke with Roselyn Reef @ [facility].  She stated they are waiting on films from [facility] .  Once they receive those films and read mammogram you will get the results

## 2018-09-13 LAB — CYTOLOGY - PAP
DIAGNOSIS: NEGATIVE
HPV (WINDOPATH): NOT DETECTED

## 2019-03-13 ENCOUNTER — Encounter: Payer: Self-pay | Admitting: Primary Care

## 2019-03-13 ENCOUNTER — Other Ambulatory Visit: Payer: Self-pay

## 2019-03-13 ENCOUNTER — Ambulatory Visit: Payer: BC Managed Care – PPO | Admitting: Primary Care

## 2019-03-13 VITALS — BP 122/80 | HR 72 | Temp 97.8°F | Ht 65.0 in | Wt 155.5 lb

## 2019-03-13 DIAGNOSIS — B029 Zoster without complications: Secondary | ICD-10-CM

## 2019-03-13 HISTORY — DX: Zoster without complications: B02.9

## 2019-03-13 MED ORDER — VALACYCLOVIR HCL 1 G PO TABS: 1000.0000 mg | ORAL_TABLET | Freq: Three times a day (TID) | ORAL | 0 refills | Status: DC

## 2019-03-13 NOTE — Patient Instructions (Signed)
Start valacyclovir 1000 mg tablets for shingles. Take 1 tablet by mouth three times daily for 7 days.   Cover the area as discussed.  It was a pleasure to see you today!   Shingles  Shingles, which is also known as herpes zoster, is an infection that causes a painful skin rash and fluid-filled blisters. It is caused by a virus. Shingles only develops in people who:  Have had chickenpox.  Have been given a medicine to protect against chickenpox (have been vaccinated). Shingles is rare in this group. What are the causes? Shingles is caused by varicella-zoster virus (VZV). This is the same virus that causes chickenpox. After a person is exposed to VZV, the virus stays in the body in an inactive (dormant) state. Shingles develops if the virus is reactivated. This can happen many years after the first (initial) exposure to VZV. It is not known what causes this virus to be reactivated. What increases the risk? People who have had chickenpox or received the chickenpox vaccine are at risk for shingles. Shingles infection is more common in people who:  Are older than age 65.  Have a weakened disease-fighting system (immune system), such as people with: ? HIV. ? AIDS. ? Cancer.  Are taking medicines that weaken the immune system, such as transplant medicines.  Are experiencing a lot of stress. What are the signs or symptoms? Early symptoms of this condition include itching, tingling, and pain in an area on your skin. Pain may be described as burning, stabbing, or throbbing. A few days or weeks after early symptoms start, a painful red rash appears. The rash is usually on one side of the body and has a band-like or belt-like pattern. The rash eventually turns into fluid-filled blisters that break open, change into scabs, and dry up in about 2-3 weeks. At any time during the infection, you may also develop:  A fever.  Chills.  A headache.  An upset stomach. How is this diagnosed? This  condition is diagnosed with a skin exam. Skin or fluid samples may be taken from the blisters before a diagnosis is made. These samples are examined under a microscope or sent to a lab for testing. How is this treated? The rash may last for several weeks. There is not a specific cure for this condition. Your health care provider will probably prescribe medicines to help you manage pain, recover more quickly, and avoid long-term problems. Medicines may include:  Antiviral drugs.  Anti-inflammatory drugs.  Pain medicines.  Anti-itching medicines (antihistamines). If the area involved is on your face, you may be referred to a specialist, such as an eye doctor (ophthalmologist) or an ear, nose, and throat (ENT) doctor (otolaryngologist) to help you avoid eye problems, chronic pain, or disability. Follow these instructions at home: Medicines  Take over-the-counter and prescription medicines only as told by your health care provider.  Apply an anti-itch cream or numbing cream to the affected area as told by your health care provider. Relieving itching and discomfort   Apply cold, wet cloths (cold compresses) to the area of the rash or blisters as told by your health care provider.  Cool baths can be soothing. Try adding baking soda or dry oatmeal to the water to reduce itching. Do not bathe in hot water. Blister and rash care  Keep your rash covered with a loose bandage (dressing). Wear loose-fitting clothing to help ease the pain of material rubbing against the rash.  Keep your rash and blisters clean by  washing the area with mild soap and cool water as told by your health care provider.  Check your rash every day for signs of infection. Check for: ? More redness, swelling, or pain. ? Fluid or blood. ? Warmth. ? Pus or a bad smell.  Do not scratch your rash or pick at your blisters. To help avoid scratching: ? Keep your fingernails clean and cut short. ? Wear gloves or mittens while  you sleep, if scratching is a problem. General instructions  Rest as told by your health care provider.  Keep all follow-up visits as told by your health care provider. This is important.  Wash your hands often with soap and water. If soap and water are not available, use hand sanitizer. Doing this lowers your chance of getting a bacterial skin infection.  Before your blisters change into scabs, your shingles infection can cause chickenpox in people who have never had it or have never been vaccinated against it. To prevent this from happening, avoid contact with other people, especially: ? Babies. ? Pregnant women. ? Children who have eczema. ? Elderly people who have transplants. ? People who have chronic illnesses, such as cancer or AIDS. Contact a health care provider if:  Your pain is not relieved with prescribed medicines.  Your pain does not get better after the rash heals.  You have signs of infection in the rash area, such as: ? More redness, swelling, or pain around the rash. ? Fluid or blood coming from the rash. ? The rash area feeling warm to the touch. ? Pus or a bad smell coming from the rash. Get help right away if:  The rash is on your face or nose.  You have facial pain, pain around your eye area, or loss of feeling on one side of your face.  You have difficulty seeing.  You have ear pain or have ringing in your ear.  You have a loss of taste.  Your condition gets worse. Summary  Shingles, which is also known as herpes zoster, is an infection that causes a painful skin rash and fluid-filled blisters.  This condition is diagnosed with a skin exam. Skin or fluid samples may be taken from the blisters and examined before the diagnosis is made.  Keep your rash covered with a loose bandage (dressing). Wear loose-fitting clothing to help ease the pain of material rubbing against the rash.  Before your blisters change into scabs, your shingles infection can  cause chickenpox in people who have never had it or have never been vaccinated against it. This information is not intended to replace advice given to you by your health care provider. Make sure you discuss any questions you have with your health care provider. Document Released: 08/24/2005 Document Revised: 12/16/2018 Document Reviewed: 04/28/2017 Elsevier Patient Education  2020 [employer].

## 2019-03-13 NOTE — Progress Notes (Signed)
Subjective:    Patient ID: Chelsea Hicks, female    DOB: 22-Apr-1966, 53 y.o.   MRN: [id number]  HPI  Chelsea Hicks is a 53 year old female with a history of Crohn's disease, anemia who presents today with a chief complaint of rash.  Her symptoms began with discomfort to the left lateral thoracic trunk that began about 5 days ago. She then noticed itching and discomfort to the left lateral bra line yesterday while working outdoors. She kept thinking that she would feel better if she removed her bra. Later yesterday she noticed three small bumps to the area of discomfort. She's not applied anything to the site, no one else in her household is itching, she denies changes in medications/food/clothing, she has been outdoors but denies known tick bites.   Review of Systems  Constitutional: Negative for fever.  Skin: Positive for rash.  Neurological: Negative for numbness.       Left lateral trunk pain       Past Medical History:  Diagnosis Date  . 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 Crohn's disease ([place]) 2005  . Diffuse cystic mastopathy 2013  . Hypertrophic lichen planus   . Lichen simplex chronicus   . Regional enteritis of unspecified site   . Special screening for malignant neoplasms, colon      Social History   Socioeconomic History  . Marital status: Married    Spouse name: Not on file  . Number of children: 2  . Years of education: Not on file  . Highest education level: Not on file  Occupational History  . 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 Financial resource strain: Not on file  . Food insecurity    Worry: Not on file    Inability: Not on file  . Transportation needs    Medical: Not on file    Non-medical: Not on file  Tobacco Use  . Smoking status: Never Smoker  . Smokeless tobacco: Never Used  Substance and Sexual Activity  . Alcohol use: Yes    Alcohol/week: 0.0 standard drinks    Comment: occasionally  .  Drug use: No  . Sexual activity: Not on file  Lifestyle  . Physical activity    Days per week: Not on file    Minutes per session: Not on file  . Stress: Not on file  Relationships  . Social Herbalist on phone: Not on file    Gets together: Not on file    Attends religious service: Not on file    Active member of club or organization: Not on file    Attends meetings of clubs or organizations: Not on file    Relationship status: Not on file  . Intimate partner violence    Fear of current or ex partner: Not on file    Emotionally abused: Not on file    Physically abused: Not on file    Forced sexual activity: Not on file  Other Topics Concern  . Not on file  Social History Narrative   Attending [employer] getting Undergraduate in Early Childhood    Past Surgical History:  Procedure Laterality Date  . bowel rescetion  07/2009  . BREAST BIOPSY Left 02-11-04   core bx,benign  . CHOLECYSTECTOMY  12/2001  . COLONOSCOPY  2011   Dr. Vira Agar  . DILATION AND CURETTAGE OF UTERUS    . hemoglobin electrophinesis    . LIPOMA EXCISION  2009   from back  . LIPOMA EXCISION Left 07/18/2012   arm  . TUBAL LIGATION      Family History  Problem Relation Age of Onset  . Cancer Maternal Grandfather        prostate  . Cancer Paternal Grandmother        breast  . Breast cancer Paternal Grandmother     Allergies  Allergen Reactions  . Amoxicillin-Pot Clavulanate     REACTION: hives  . Penicillins Hives  . Sulfonamide Derivatives     REACTION: hives    Current Outpatient Medications on File Prior to Visit  Medication Sig Dispense Refill  . azathioprine (IMURAN) 100 MG tablet Take 150 mg by mouth daily.    . Iron-Vitamin C (IRON 100/C) 100-250 MG TABS     . Multiple Vitamin (MULTIVITAMIN) tablet Take 1 tablet by mouth daily.    Marland Kitchen VITAMIN D PO Take by mouth.     No current facility-administered medications on file prior to visit.     BP 122/80   Pulse 72   Temp 97.8 F  (36.6 C) (Tympanic)   Ht 5' 5"  (1.651 m)   Wt 155 lb 8 oz (70.5 kg)   LMP 01/15/2019   SpO2 99%   BMI 25.88 kg/m    Objective:   Physical Exam  Constitutional: She appears well-nourished.  Respiratory: Effort normal.  Skin: Skin is warm and dry. Rash noted.  Three small intact vesicles grouped in a tripod to left lateral thoracic chest wall. One small lightly red spot to left anterior chest under left breast.            Assessment & Plan:

## 2019-03-13 NOTE — Assessment & Plan Note (Signed)
Evident on exam today. Vesicles are intact.  Rx for Valtrex 1g x TID x 7 day course sent to pharmacy. Discussed home care instructions. She will update.

## 2019-05-19 ENCOUNTER — Other Ambulatory Visit (HOSPITAL_COMMUNITY): Payer: Self-pay | Admitting: *Deleted

## 2019-05-19 ENCOUNTER — Other Ambulatory Visit: Payer: Self-pay | Admitting: *Deleted

## 2019-05-22 ENCOUNTER — Other Ambulatory Visit: Payer: Self-pay | Admitting: Nurse Practitioner

## 2019-05-22 ENCOUNTER — Other Ambulatory Visit (HOSPITAL_COMMUNITY): Payer: Self-pay | Admitting: Nurse Practitioner

## 2019-05-22 DIAGNOSIS — K50812 Crohn's disease of both small and large intestine with intestinal obstruction: Secondary | ICD-10-CM

## 2019-06-20 ENCOUNTER — Ambulatory Visit: Payer: BC Managed Care – PPO

## 2019-09-25 ENCOUNTER — Other Ambulatory Visit: Payer: Self-pay | Admitting: Primary Care

## 2019-09-25 DIAGNOSIS — Z1231 Encounter for screening mammogram for malignant neoplasm of breast: Secondary | ICD-10-CM

## 2019-09-27 ENCOUNTER — Ambulatory Visit
Admission: RE | Admit: 2019-09-27 | Discharge: 2019-09-27 | Disposition: A | Discharge status: Home / Self Care | Payer: BC Managed Care – PPO | Source: Ambulatory Visit | Attending: Primary Care | Admitting: Primary Care

## 2019-09-27 ENCOUNTER — Other Ambulatory Visit: Payer: Self-pay

## 2019-09-27 DIAGNOSIS — Z1231 Encounter for screening mammogram for malignant neoplasm of breast: Secondary | ICD-10-CM | POA: Insufficient documentation

## 2019-09-27 IMAGING — MG DIGITAL SCREENING BILAT W/ TOMO W/ CAD
13 of 16 series · 13 of 32 positions shown · non-contrast
Comparison: Previous exam(s).

CLINICAL DATA: Screening.

EXAM:
DIGITAL SCREENING BILATERAL MAMMOGRAM WITH TOMO AND CAD

[R CC synth-2D]
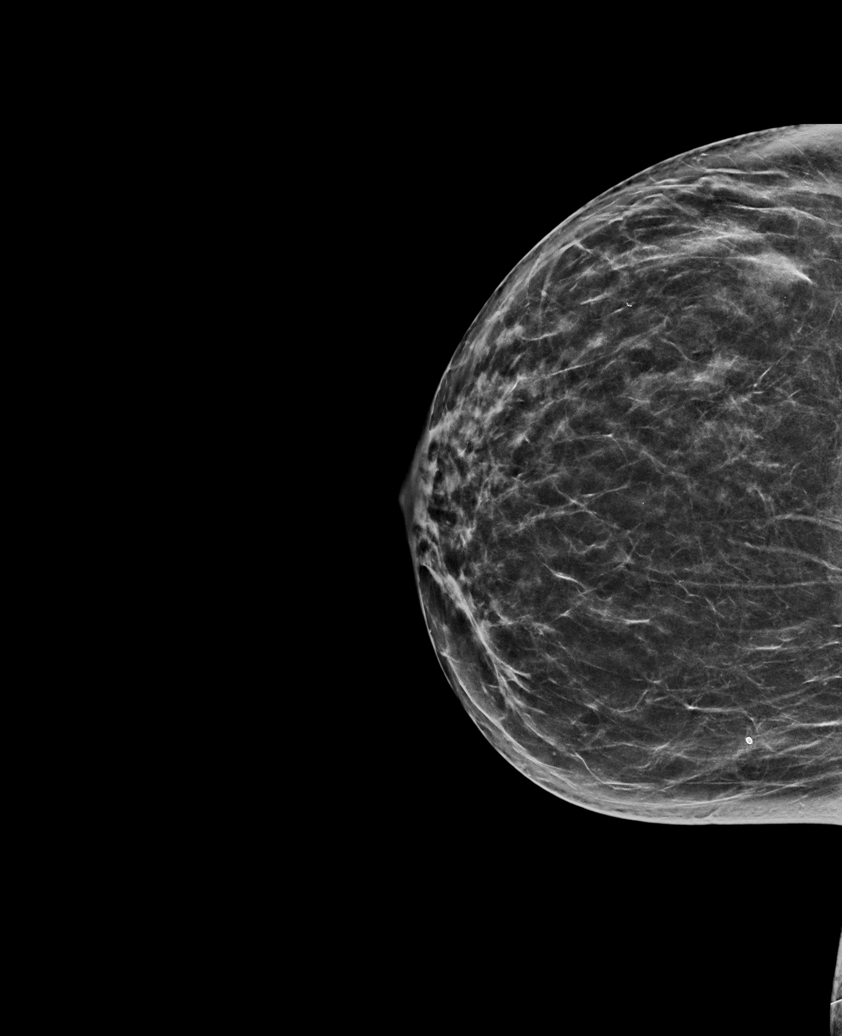

[L MLO synth-2D]
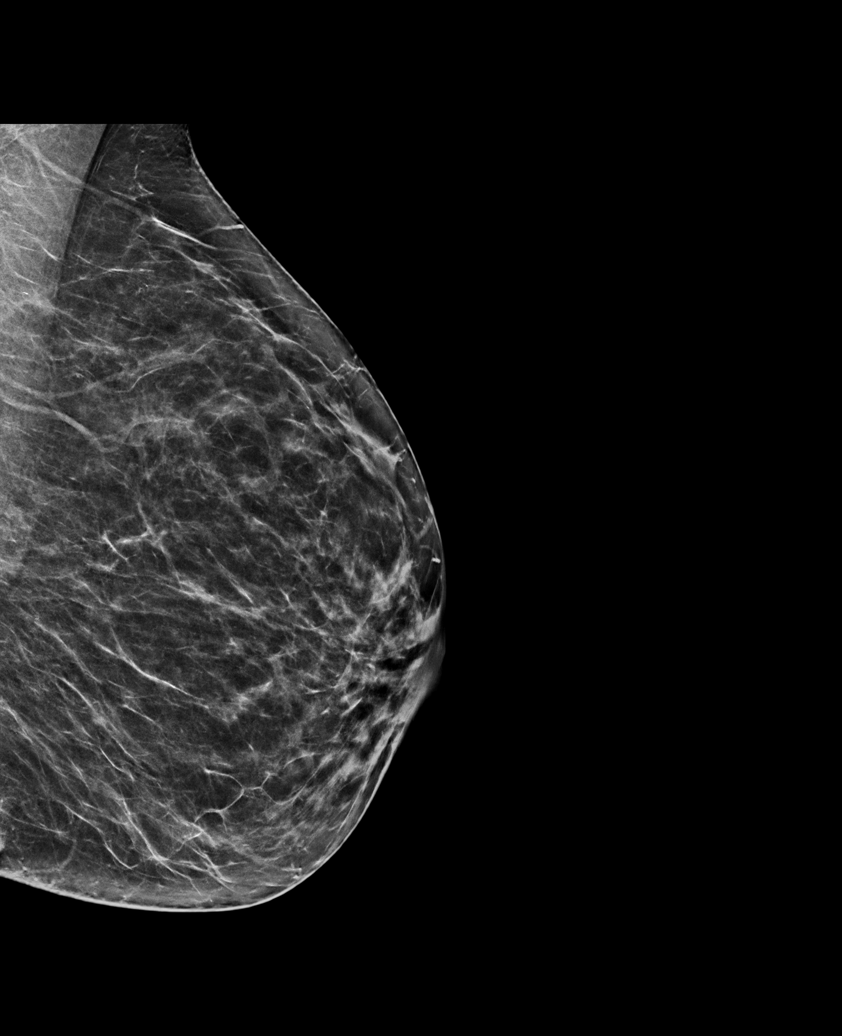

[R MLO synth-2D]
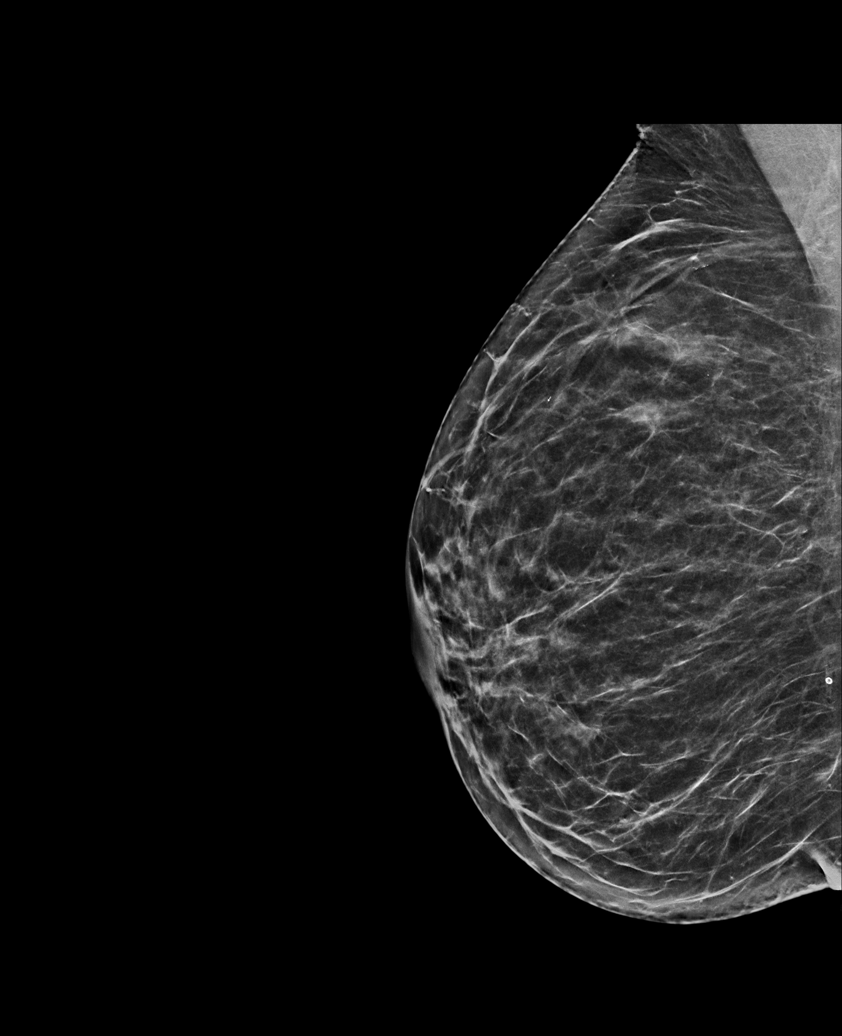

[L CC synth-2D]
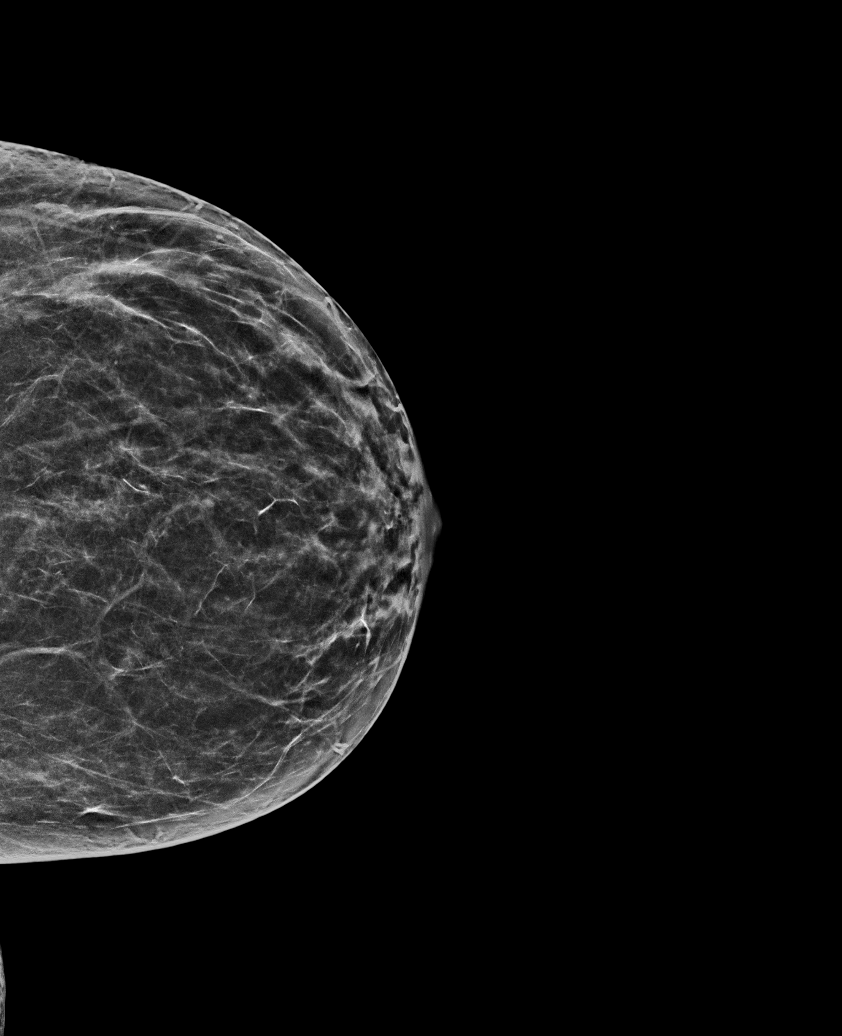

[R MLO tomo (1 of 3)]
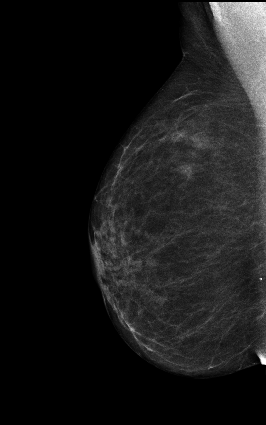

[L MLO tomo (1 of 2)]
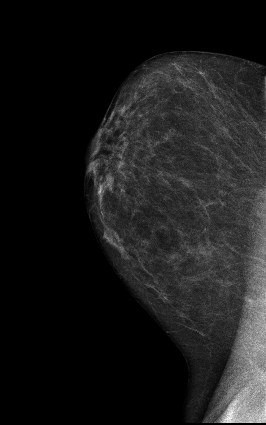

[L CC tomo (1 of 2)]
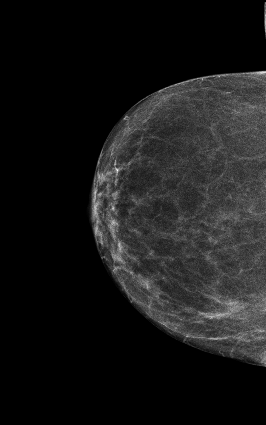

[R CC tomo (1 of 2)]
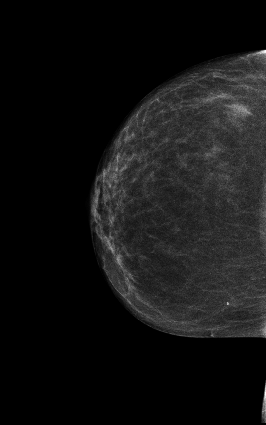

[L MLO tomo (2 of 2)]
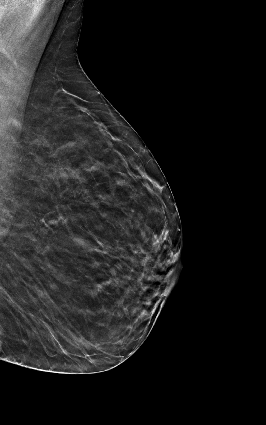

[R MLO tomo (2 of 3)]
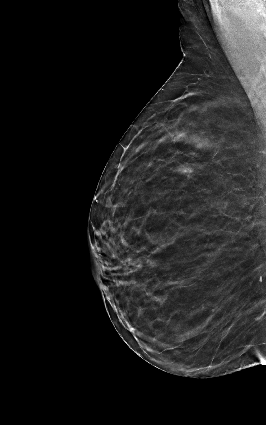

[L CC tomo (2 of 2)]
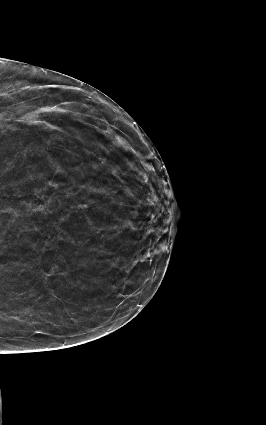

[R CC tomo (2 of 2)]
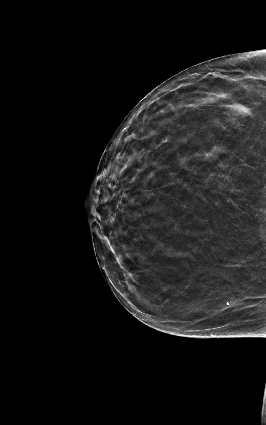

[R MLO tomo (3 of 3) · tomo slice 34/67.0]
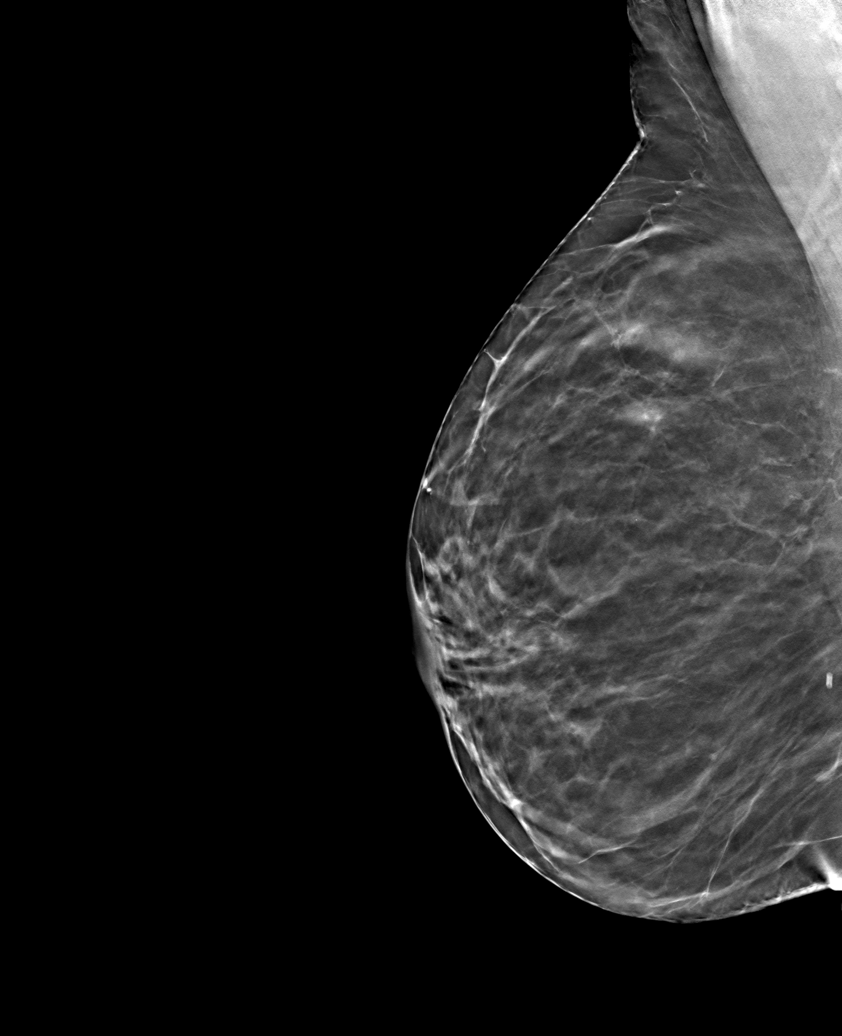

[13 of 32 positions shown; findings below may reference images not displayed]

ACR Breast Density Category b: There are scattered areas of
fibroglandular density.
FINDINGS: There are no findings suspicious for malignancy. Images were
processed with CAD.
IMPRESSION: No mammographic evidence of malignancy. A result letter of this
screening mammogram will be mailed directly to the patient.

RECOMMENDATION:
Screening mammogram in one year. (Code:[id number])

BI-RADS CATEGORY  1: Negative.

## 2019-10-04 ENCOUNTER — Encounter: Payer: Self-pay | Admitting: Primary Care

## 2019-10-04 ENCOUNTER — Other Ambulatory Visit: Payer: Self-pay

## 2019-10-04 ENCOUNTER — Ambulatory Visit (INDEPENDENT_AMBULATORY_CARE_PROVIDER_SITE_OTHER): Payer: BC Managed Care – PPO | Admitting: Primary Care

## 2019-10-04 VITALS — BP 116/76 | HR 70 | Temp 97.0°F | Ht 65.0 in | Wt 155.8 lb

## 2019-10-04 DIAGNOSIS — N6019 Diffuse cystic mastopathy of unspecified breast: Secondary | ICD-10-CM

## 2019-10-04 DIAGNOSIS — Z0000 Encounter for general adult medical examination without abnormal findings: Secondary | ICD-10-CM | POA: Diagnosis not present

## 2019-10-04 DIAGNOSIS — K50919 Crohn's disease, unspecified, with unspecified complications: Secondary | ICD-10-CM

## 2019-10-04 NOTE — Assessment & Plan Note (Addendum)
Following with GI, colonoscopy UTD. Doing well on Imuran. Prior labs reviewed.

## 2019-10-04 NOTE — Addendum Note (Signed)
Addended by: Pleas Koch on: 10/04/2019 03:47 PM   Modules accepted: Orders

## 2019-10-04 NOTE — Patient Instructions (Signed)
Stop by the lab prior to leaving today. I will notify you of your results once received.   Start exercising. You should be getting 150 minutes of moderate intensity exercise weekly.  Continue to work on a healthy diet.  Schedule a nurse visit for July 2021 for your first shingles vaccine. The second dose is 2-6 months from the first.   It was a pleasure to see you today!   Preventive Care 22-54 Years Old, Female Preventive care refers to visits with your health care provider and lifestyle choices that can promote health and wellness. This includes:  A yearly physical exam. This may also be called an annual well check.  Regular dental visits and eye exams.  Immunizations.  Screening for certain conditions.  Healthy lifestyle choices, such as eating a healthy diet, getting regular exercise, not using drugs or products that contain nicotine and tobacco, and limiting alcohol use. What can I expect for my preventive care visit? Physical exam Your health care provider will check your:  Height and weight. This may be used to calculate body mass index (BMI), which tells if you are at a healthy weight.  Heart rate and blood pressure.  Skin for abnormal spots. Counseling Your health care provider may ask you questions about your:  Alcohol, tobacco, and drug use.  Emotional well-being.  Home and relationship well-being.  Sexual activity.  Eating habits.  Work and work Statistician.  Method of birth control.  Menstrual cycle.  Pregnancy history. What immunizations do I need?  Influenza (flu) vaccine  This is recommended every year. Tetanus, diphtheria, and pertussis (Tdap) vaccine  You may need a Td booster every 10 years. Varicella (chickenpox) vaccine  You may need this if you have not been vaccinated. Zoster (shingles) vaccine  You may need this after age 27. Measles, mumps, and rubella (MMR) vaccine  You may need at least one dose of MMR if you were born in  1957 or later. You may also need a second dose. Pneumococcal conjugate (PCV13) vaccine  You may need this if you have certain conditions and were not previously vaccinated. Pneumococcal polysaccharide (PPSV23) vaccine  You may need one or two doses if you smoke cigarettes or if you have certain conditions. Meningococcal conjugate (MenACWY) vaccine  You may need this if you have certain conditions. Hepatitis A vaccine  You may need this if you have certain conditions or if you travel or work in places where you may be exposed to hepatitis A. Hepatitis B vaccine  You may need this if you have certain conditions or if you travel or work in places where you may be exposed to hepatitis B. Haemophilus influenzae type b (Hib) vaccine  You may need this if you have certain conditions. Human papillomavirus (HPV) vaccine  If recommended by your health care provider, you may need three doses over 6 months. You may receive vaccines as individual doses or as more than one vaccine together in one shot (combination vaccines). Talk with your health care provider about the risks and benefits of combination vaccines. What tests do I need? Blood tests  Lipid and cholesterol levels. These may be checked every 5 years, or more frequently if you are over 67 years old.  Hepatitis C test.  Hepatitis B test. Screening  Lung cancer screening. You may have this screening every year starting at age 59 if you have a 30-pack-year history of smoking and currently smoke or have quit within the past 15 years.  Colorectal cancer  screening. All adults should have this screening starting at age 71 and continuing until age 76. Your health care provider may recommend screening at age 27 if you are at increased risk. You will have tests every 1-10 years, depending on your results and the type of screening test.  Diabetes screening. This is done by checking your blood sugar (glucose) after you have not eaten for a  while (fasting). You may have this done every 1-3 years.  Mammogram. This may be done every 1-2 years. Talk with your health care provider about when you should start having regular mammograms. This may depend on whether you have a family history of breast cancer.  BRCA-related cancer screening. This may be done if you have a family history of breast, ovarian, tubal, or peritoneal cancers.  Pelvic exam and Pap test. This may be done every 3 years starting at age 47. Starting at age 66, this may be done every 5 years if you have a Pap test in combination with an HPV test. Other tests  Sexually transmitted disease (STD) testing.  Bone density scan. This is done to screen for osteoporosis. You may have this scan if you are at high risk for osteoporosis. Follow these instructions at home: Eating and drinking  Eat a diet that includes fresh fruits and vegetables, whole grains, lean protein, and low-fat dairy.  Take vitamin and mineral supplements as recommended by your health care provider.  Do not drink alcohol if: ? Your health care provider tells you not to drink. ? You are pregnant, may be pregnant, or are planning to become pregnant.  If you drink alcohol: ? Limit how much you have to 0-1 drink a day. ? Be aware of how much alcohol is in your drink. In the [place] one drink equals one 12 oz bottle of beer (355 mL), one 5 oz glass of wine (148 mL), or one 1 oz glass of hard liquor (44 mL). Lifestyle  Take daily care of your teeth and gums.  Stay active. Exercise for at least 30 minutes on 5 or more days each week.  Do not use any products that contain nicotine or tobacco, such as cigarettes, e-cigarettes, and chewing tobacco. If you need help quitting, ask your health care provider.  If you are sexually active, practice safe sex. Use a condom or other form of birth control (contraception) in order to prevent pregnancy and STIs (sexually transmitted infections).  If told by your  health care provider, take low-dose aspirin daily starting at age 54. What's next?  Visit your health care provider once a year for a well check visit.  Ask your health care provider how often you should have your eyes and teeth checked.  Stay up to date on all vaccines. This information is not intended to replace advice given to you by your health care provider. Make sure you discuss any questions you have with your health care provider. Document Revised: 05/05/2018 Document Reviewed: 05/05/2018 Elsevier Patient Education  2020 [employer].

## 2019-10-04 NOTE — Assessment & Plan Note (Signed)
Immunizations UTD. Will have her get her first Shingrix vaccine in July 2021 as she had herpes zoster in July 2020. Pap smear UTD. Mammogram UTD. Colonoscopy UTD. Encouraged a healthy diet, regular exercise. Exam unremarkable. Labs reviewed from [facility].

## 2019-10-04 NOTE — Assessment & Plan Note (Signed)
Mammogram UTD.

## 2019-10-04 NOTE — Progress Notes (Signed)
Subjective:    Patient ID: Chelsea Hicks, female    DOB: [birth year], 54 y.o.   MRN: [id number]  HPI  Chelsea Hicks is a 54 year old female who presents today for complete physical.  Immunizations: -Tetanus: Completed in 2012 -Influenza: Completed this season  -Shingles: Never completed, had Shingles in July 2020  Diet: She endorses a healthy diet. Exercise: She is not exercising  Eye exam: Due in February  Dental exam: Completes semi-annually   Pap Smear: Completed in 2020 Mammogram: Completed in 2021 Colonoscopy: Completed in 2019, following with GI. Due in 3-5 years.  BP Readings from Last 3 Encounters:  10/04/19 116/76  03/13/19 122/80  09/09/18 116/74     Review of Systems  Constitutional: Negative for unexpected weight change.  HENT: Negative for rhinorrhea.   Respiratory: Negative for cough and shortness of breath.   Cardiovascular: Negative for chest pain.  Gastrointestinal: Negative for constipation and diarrhea.  Genitourinary: Negative for difficulty urinating and menstrual problem.       Irregular periods   Musculoskeletal: Negative for arthralgias and myalgias.  Skin: Negative for rash.  Allergic/Immunologic: Negative for environmental allergies.  Neurological: Negative for dizziness and numbness.       Monthly cyclical headaches with menses   Psychiatric/Behavioral: The patient is not nervous/anxious.        Past Medical History:  Diagnosis Date  . 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 Crohn's disease ([place]) 2005  . Diffuse cystic mastopathy 2013  . Hypertrophic lichen planus   . Lichen simplex chronicus   . Regional enteritis of unspecified site   . Special screening for malignant neoplasms, colon      Social History   Socioeconomic History  . Marital status: Married    Spouse name: Not on file  . Number of children: 2  . Years of education: Not on file  . Highest education level: Not on file    Occupational History  . 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 Smoking status: Never Smoker  . Smokeless tobacco: Never Used  Substance and Sexual Activity  . Alcohol use: Yes    Alcohol/week: 0.0 standard drinks    Comment: occasionally  . Drug use: No  . Sexual activity: Not on file  Other Topics Concern  . Not on file  Social History Narrative   Attending [employer] getting Undergraduate in Early Childhood   Social Determinants of Health   Financial Resource Strain:   . Difficulty of Paying Living Expenses: Not on file  Food Insecurity:   . Worried About Charity fundraiser in the Last Year: Not on file  . Ran Out of Food in the Last Year: Not on file  Transportation Needs:   . Lack of Transportation (Medical): Not on file  . Lack of Transportation (Non-Medical): Not on file  Physical Activity:   . Days of Exercise per Week: Not on file  . Minutes of Exercise per Session: Not on file  Stress:   . Feeling of Stress : Not on file  Social Connections:   . Frequency of Communication with Friends and Family: Not on file  . Frequency of Social Gatherings with Friends and Family: Not on file  . Attends Religious Services: Not on file  . Active Member of Clubs or Organizations: Not on file  . Attends Archivist Meetings: Not on file  . Marital Status: Not on file  Intimate Partner Violence:   .  Fear of Current or Ex-Partner: Not on file  . Emotionally Abused: Not on file  . Physically Abused: Not on file  . Sexually Abused: Not on file    Past Surgical History:  Procedure Laterality Date  . bowel rescetion  07/2009  . BREAST BIOPSY Left 02-11-04   core bx,benign  . CHOLECYSTECTOMY  12/2001  . COLONOSCOPY  2011   Dr. Vira Agar  . DILATION AND CURETTAGE OF UTERUS    . hemoglobin electrophinesis    . LIPOMA EXCISION  2009   from back  . LIPOMA EXCISION Left 07/18/2012   arm  . TUBAL LIGATION      Family History  Problem Relation Age of Onset  .  Cancer Maternal Grandfather        prostate  . Cancer Paternal Grandmother        breast  . Breast cancer Paternal Grandmother     Allergies  Allergen Reactions  . Amoxicillin-Pot Clavulanate     REACTION: hives  . Penicillins Hives  . Sulfonamide Derivatives     REACTION: hives    Current Outpatient Medications on File Prior to Visit  Medication Sig Dispense Refill  . azathioprine (IMURAN) 100 MG tablet Take 150 mg by mouth daily.    . Iron-Vitamin C (IRON 100/C) 100-250 MG TABS     . Multiple Vitamin (MULTIVITAMIN) tablet Take 1 tablet by mouth daily.    Marland Kitchen VITAMIN D PO Take by mouth.     No current facility-administered medications on file prior to visit.    BP 116/76   Pulse 70   Temp (!) 97 F (36.1 C) (Temporal)   Ht 5' 5"  (1.651 m)   Wt 155 lb 12 oz (70.6 kg)   LMP 10/03/2019   SpO2 98%   BMI 25.92 kg/m    Objective:   Physical Exam  Constitutional: She is oriented to person, place, and time. She appears well-nourished.  HENT:  Right Ear: Tympanic membrane and ear canal normal.  Left Ear: Tympanic membrane and ear canal normal.  Mouth/Throat: Oropharynx is clear and moist.  Eyes: Pupils are equal, round, and reactive to light. EOM are normal.  Cardiovascular: Normal rate and regular rhythm.  Respiratory: Effort normal and breath sounds normal.  GI: Soft. Bowel sounds are normal. There is no abdominal tenderness.  Musculoskeletal:        General: Normal range of motion.     Cervical back: Neck supple.  Neurological: She is alert and oriented to person, place, and time. No cranial nerve deficit.  Reflex Scores:      Patellar reflexes are 2+ on the right side and 2+ on the left side. Skin: Skin is warm and dry.  Psychiatric: She has a normal mood and affect.           Assessment & Plan:

## 2019-10-05 LAB — LIPID PANEL
Cholesterol: 171 mg/dL (ref 0–200)
HDL: 68.9 mg/dL (ref >39.00)
LDL Cholesterol: 78 mg/dL (ref 0–99)
NonHDL: 102.37
Total CHOL/HDL Ratio: 2
Triglycerides: 121 mg/dL (ref 0.0–149.0)
VLDL: 24.2 mg/dL (ref 0.0–40.0)

## 2019-10-11 ENCOUNTER — Ambulatory Visit
Admission: RE | Admit: 2019-10-11 | Discharge: 2019-10-11 | Disposition: A | Discharge status: Home / Self Care | Payer: BC Managed Care – PPO | Source: Ambulatory Visit | Attending: Nurse Practitioner | Admitting: Nurse Practitioner

## 2019-10-11 ENCOUNTER — Other Ambulatory Visit: Payer: Self-pay

## 2019-10-11 DIAGNOSIS — K50812 Crohn's disease of both small and large intestine with intestinal obstruction: Secondary | ICD-10-CM | POA: Diagnosis present

## 2019-10-11 IMAGING — CT CT ENTEROGRAPHY (ABD-PELV W/ CM)
2 of 6 series · 15 of 46 positions shown, 17 images · IV contrast (omnipaque)
Comparison: 07/02/2009

CLINICAL DATA: Assessment for active Crohn's disease. Prior bowel
resection related to Crohn's disease.

EXAM:
CT ABDOMEN AND PELVIS WITH CONTRAST (ENTEROGRAPHY)
TECHNIQUE: Multidetector CT of the abdomen and pelvis during bolus
administration of intravenous contrast. Negative oral contrast was
given.
CONTRAST:  100mL OMNIPAQUE IOHEXOL 300 MG/ML  SOLN

[Series 3: entero thins · axial · 0.81mm/px · z∈[-889,-505]mm · 12 of 218 slices shown, 14 images]
[im 13/218  soft-tissue]
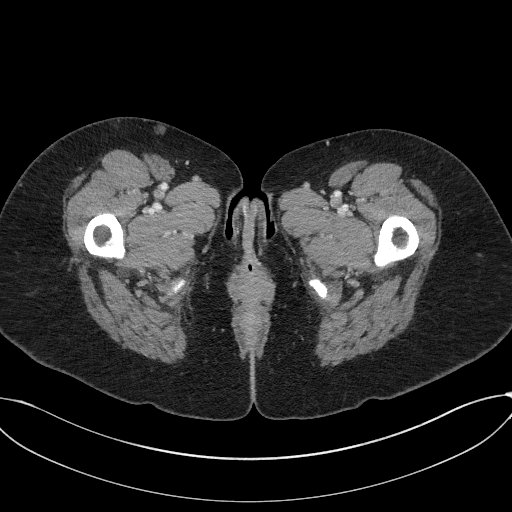
[im 13/218  bone]
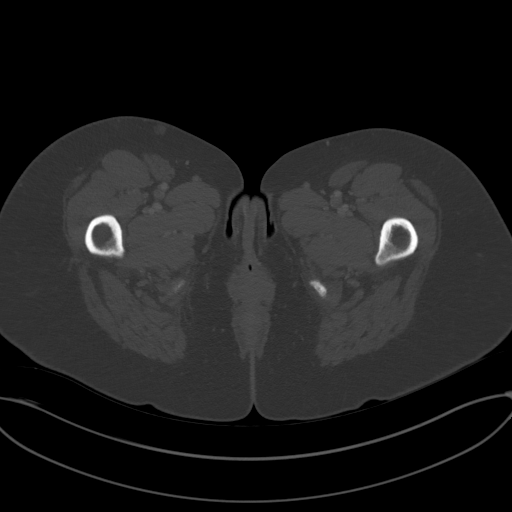
[im 37/218  soft-tissue]
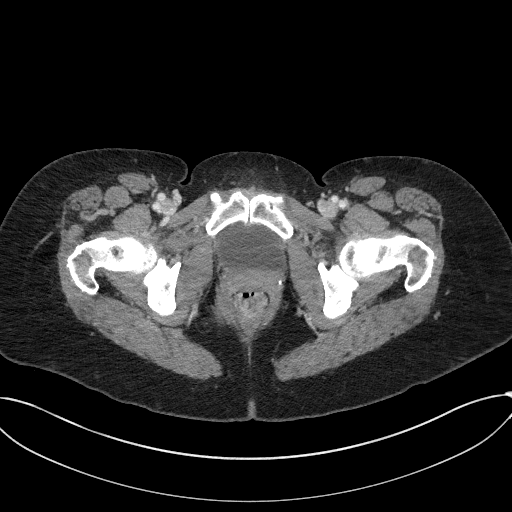
[im 49/218  soft-tissue]
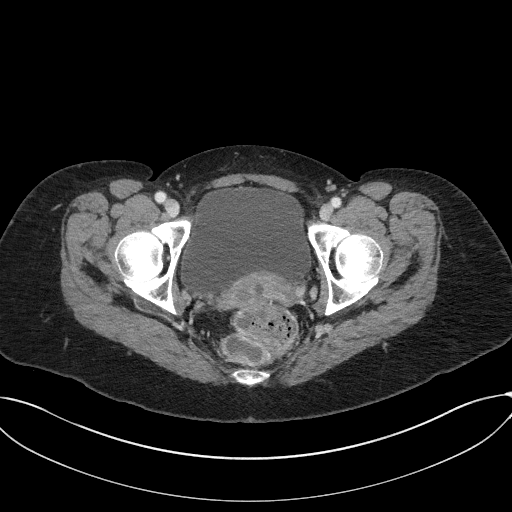
[im 61/218  soft-tissue]
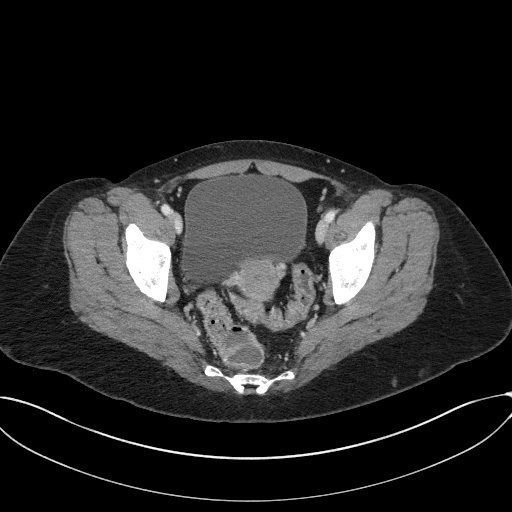
[im 85/218  soft-tissue]
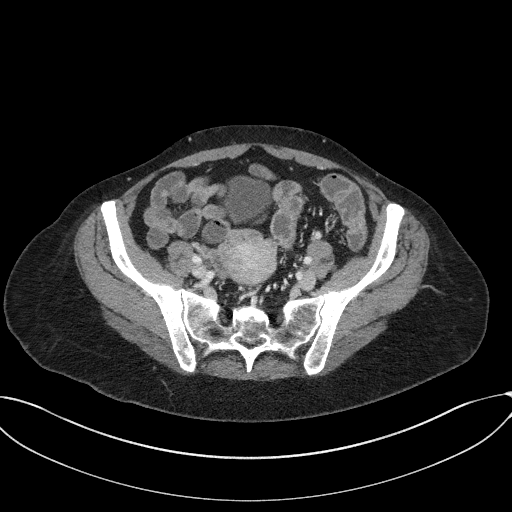
[im 97/218  soft-tissue]
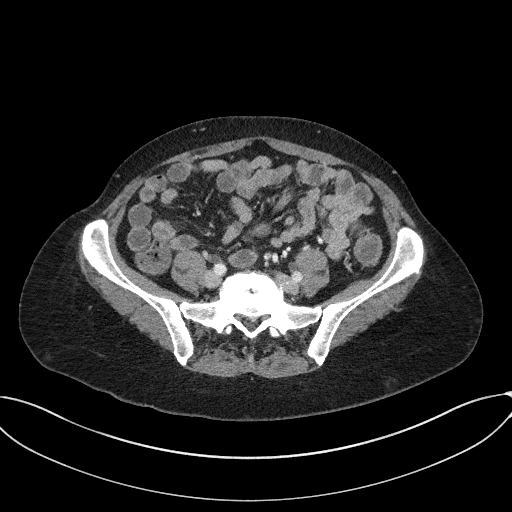
[im 121/218  soft-tissue]
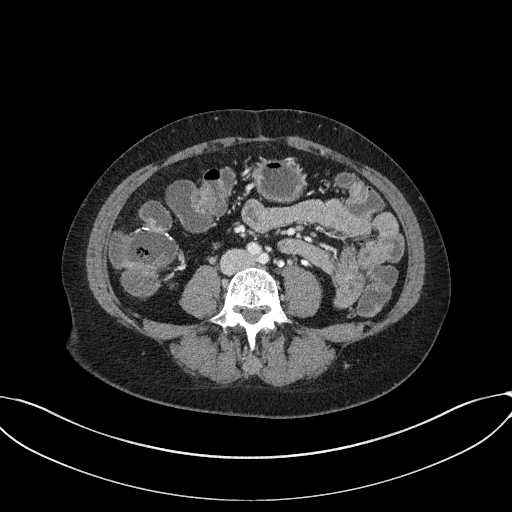
[im 133/218  soft-tissue]
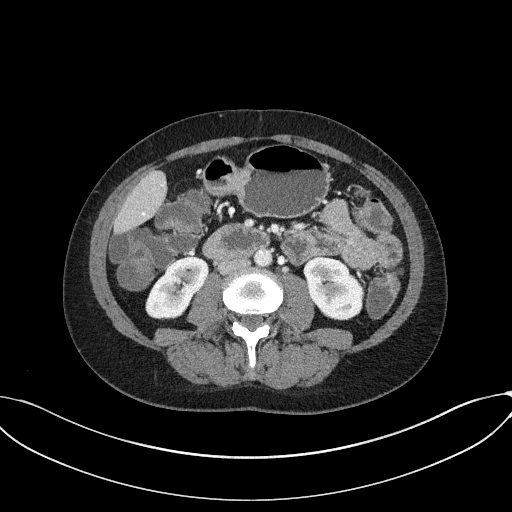
[im 157/218  soft-tissue]
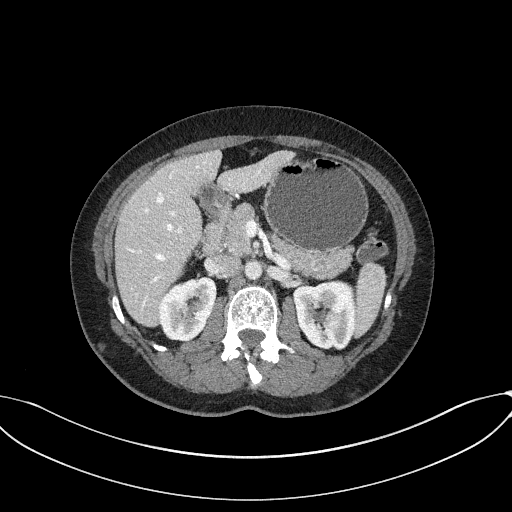
[im 157/218  bone]
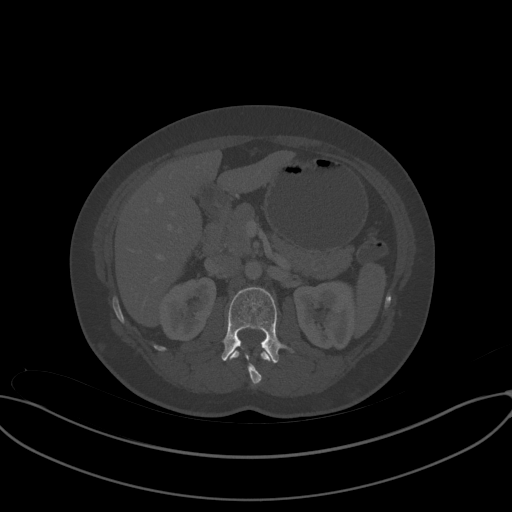
[im 169/218  soft-tissue]
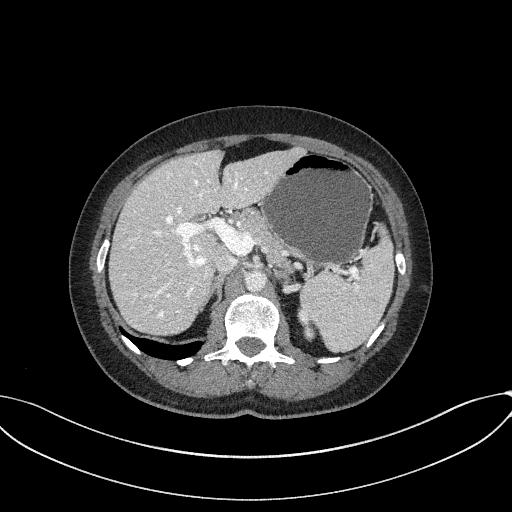
[im 181/218  soft-tissue]
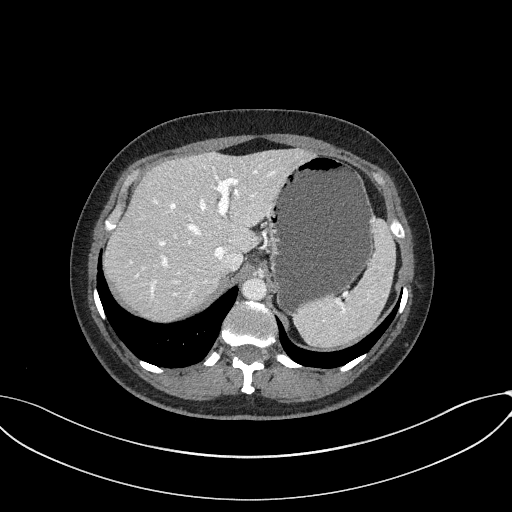
[im 205/218  soft-tissue]
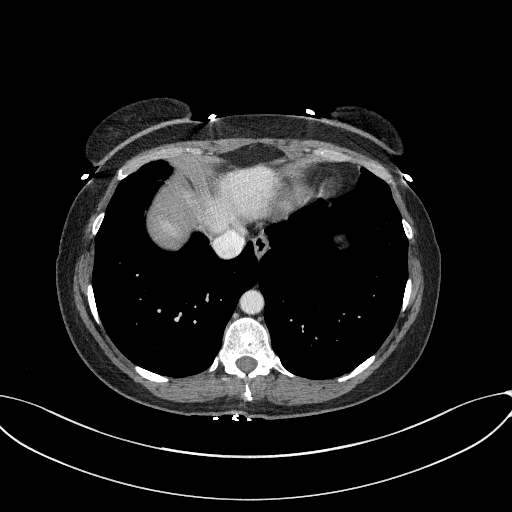

[Series 6: coronal · coronal · 0.73mm/px · 3 of 84 slices shown]
[im 28/84  soft-tissue]
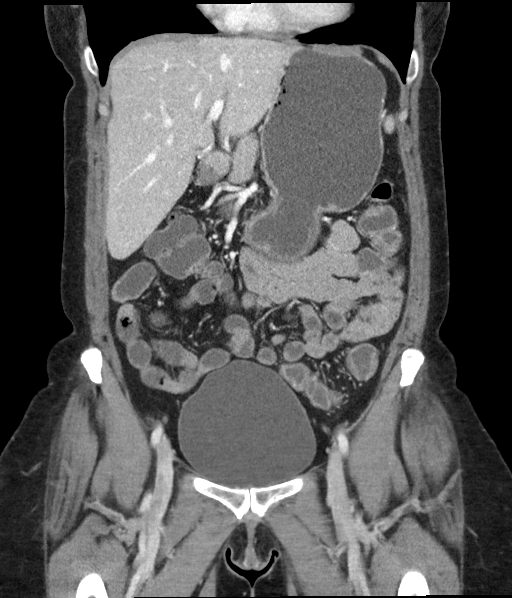
[im 37/84  soft-tissue]
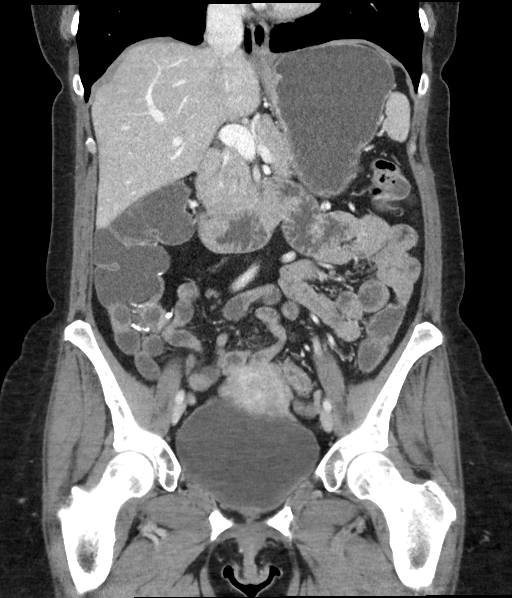
[im 47/84  soft-tissue]
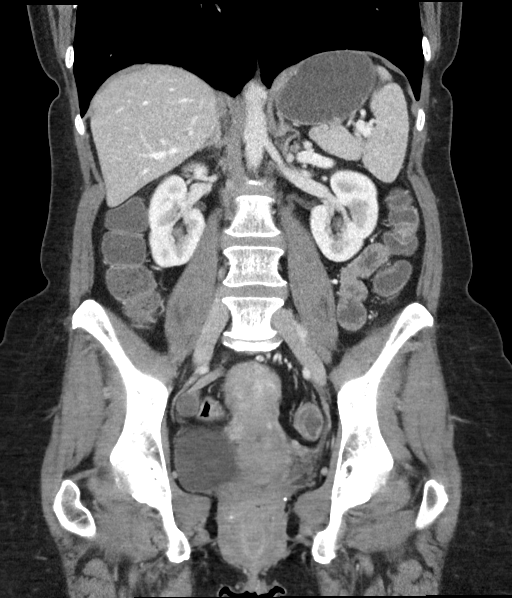

[15 of 46 positions shown; findings below may reference images not displayed]

FINDINGS: Lower chest:  Unremarkable

Hepatobiliary: Cholecystectomy. Otherwise unremarkable.

Pancreas: Unremarkable

Spleen: Unremarkable

Adrenals/Urinary Tract: In the left kidney upper pole, a 3.0 by
by 1.9 cm (volume = 5.7 cm^3) mass has a portal venous phase
density of 154 Hounsfield units and a delayed phase density of 108
Hounsfield units, indicating de-enhancement of a solid mass. This is
highly likely to be a renal cell carcinoma, and is fully contained
within the renal parenchyma rather than being exophytic. A portion
of the mass abuts the collecting system.

The adrenal glands appear normal. No discrete urinary tract calculi
are identified.

Stomach/Bowel: Right ileocolic anastomosis observed.

Small bowel folds in caliber appear reasonably well preserved
including in the distal ileum approaching the ileocolic anastomosis.
However, there is irregular wall thickening, mucosal enhancement,
and adjacent mesenteric hyperemia in the distal descending colon,
sigmoid colon, and rectum indicating active inflammation and likely
Crohn's colitis. No abscess or extraluminal gas identified.

Vascular/Lymphatic: No tumor thrombus in the left renal vein. No
pathologic adenopathy identified. Mild abdominal aortic
atherosclerotic calcification.

Reproductive: Small uterine fibroids. Nabothian cysts along the
cervix.

Other: No supplemental non-categorized findings.

Musculoskeletal: Stable sclerotic 1.3 by 0.9 cm bone island in the
left iliac bone just above the acetabulum, image 62/2 (no change
from 8676).

Intervertebral and facet spurring cause left foraminal impingement
at L5-S1.
IMPRESSION: 1. Abnormal wall thickening, mucosal enhancement, and adjacent
mesenteric hyperemia in the distal descending colon, sigmoid colon,
and rectum compatible with active inflammation and likely Crohn's
colitis. No abscess or extraluminal gas identified.
2. 3.0 cm solid mass in the left kidney upper pole, highly likely to
be a renal cell carcinoma. No findings of venous tumor thrombus,
adenopathy, or metastatic disease. Urology referral recommended.
3. Other imaging findings of potential clinical significance: Small
uterine fibroids. Left foraminal impingement at L5-S1 due to
spurring. Aortic Atherosclerosis ([id number]-014.4).

These results will be called to the ordering clinician or
representative by the Radiologist Assistant, and communication
documented in the PACS or zVision Dashboard.

## 2019-10-11 MED ORDER — IOHEXOL 300 MG/ML  SOLN
100.0000 mL | Freq: Once | INTRAMUSCULAR | Status: AC | PRN
  Administered 2019-10-11: 14:00:00 100 mL via INTRAVENOUS

## 2019-10-16 ENCOUNTER — Telehealth: Payer: Self-pay | Admitting: Urology

## 2019-10-16 DIAGNOSIS — C649 Malignant neoplasm of unspecified kidney, except renal pelvis: Secondary | ICD-10-CM

## 2019-11-01 ENCOUNTER — Other Ambulatory Visit: Payer: Self-pay

## 2019-11-01 ENCOUNTER — Encounter: Payer: Self-pay | Admitting: Urology

## 2019-11-01 ENCOUNTER — Ambulatory Visit: Payer: BC Managed Care – PPO | Admitting: Urology

## 2019-11-01 VITALS — BP 173/81 | HR 108 | Ht 65.0 in | Wt 154.0 lb

## 2019-11-01 DIAGNOSIS — N2889 Other specified disorders of kidney and ureter: Secondary | ICD-10-CM | POA: Diagnosis not present

## 2019-11-01 NOTE — Progress Notes (Signed)
10/31/2018 2:10 PM   Chelsea Hicks 1966-01-05 [id number]  Referring provider: Pleas Koch, NP [place],  [place] [address]  Chief Complaint  Patient presents with  . Renal mass    HPI: Chelsea Hicks is a 54 yo F who presents today for the evaluation and management of She was referted to [place] by Pleas Koch, NP.   She is seen and evaluated by gastroenterology.  She underwent a screening CT scan given her history of Crohn's disease.  This indicated an incidental 3.0 cm left endophytic solid enhancing renal mass concerning for possible renal cell carcinoma.  No flank pain, hematuria or any other symptoms.  No weight loss.  No family history of GU malignancy.  Pt denies use of blood thinners.  She has a previous SHx of a cholecystectomy and bowel resection.  PMH: Past Medical History:  Diagnosis Date  . 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 Crohn's disease ([place]) 2005  . Diffuse cystic mastopathy 2013  . Herpes zoster without complication 10/10/7626  . Hypertrophic lichen planus   . Lichen simplex chronicus   . Regional enteritis of unspecified site   . Special screening for malignant neoplasms, colon     Surgical History: Past Surgical History:  Procedure Laterality Date  . bowel rescetion  07/2009  . BREAST BIOPSY Left 02-11-04   core bx,benign  . CHOLECYSTECTOMY  12/2001  . COLONOSCOPY  2011   Dr. Vira Agar  . DILATION AND CURETTAGE OF UTERUS    . hemoglobin electrophinesis    . LIPOMA EXCISION  2009   from back  . LIPOMA EXCISION Left 07/18/2012   arm  . TUBAL LIGATION      Home Medications:  Allergies as of 11/01/2019      Reactions   Amoxicillin-pot Clavulanate    REACTION: hives   Penicillins Hives   Sulfonamide Derivatives    REACTION: hives      Medication List       Accurate as of November 01, 2019 11:59 PM. If you have any questions, ask your nurse or doctor.        azathioprine  100 MG tablet Commonly known as: IMURAN Take 150 mg by mouth daily.   Iron 100/C 100-250 MG Tabs Generic drug: Iron-Vitamin C   multivitamin tablet Take 1 tablet by mouth daily.   VITAMIN D PO Take by mouth.       Allergies:  Allergies  Allergen Reactions  . Amoxicillin-Pot Clavulanate     REACTION: hives  . Penicillins Hives  . Sulfonamide Derivatives     REACTION: hives    Family History: Family History  Problem Relation Age of Onset  . Cancer Maternal Grandfather        prostate  . Cancer Paternal Grandmother        breast  . Breast cancer Paternal Grandmother     Social History:  reports that she has never smoked. She has never used smokeless tobacco. She reports current alcohol use. She reports that she does not use drugs.   Physical Exam: BP (!) 173/81   Pulse (!) 108   Ht 5' 5"  (1.651 m)   Wt 154 lb (69.9 kg)   LMP 10/03/2019   BMI 25.63 kg/m   Constitutional:  Alert and oriented, No acute distress. HEENT: [place] AT, moist mucus membranes.  Trachea midline, no masses. Cardiovascular: No clubbing, cyanosis, or edema. Respiratory: Normal respiratory effort, no increased  work of breathing. GI: Laparoscopic incision in RUQ, 10 cm midline abdominal scar just above umbilicus   Skin: No rashes, bruises or suspicious lesions. Neurologic: Grossly intact, no focal deficits, moving all 4 extremities. Psychiatric: Normal mood and affect.  Pertinent Imaging: CLINICAL DATA:  Assessment for active Crohn's disease. Prior bowel resection related to Crohn's disease.  EXAM: CT ABDOMEN AND PELVIS WITH CONTRAST (ENTEROGRAPHY)  TECHNIQUE: Multidetector CT of the abdomen and pelvis during bolus administration of intravenous contrast. Negative oral contrast was given.  CONTRAST:  174m OMNIPAQUE IOHEXOL 300 MG/ML  SOLN  COMPARISON:  07/02/2009  FINDINGS: Lower chest:  Unremarkable  Hepatobiliary: Cholecystectomy. Otherwise unremarkable.  Pancreas:  Unremarkable  Spleen: Unremarkable  Adrenals/Urinary Tract: In the left kidney upper pole, a 3.0 by 1.9 by 1.9 cm (volume = 5.7 cm^3) mass has a portal venous phase density of 154 Hounsfield units and a delayed phase density of 108 Hounsfield units, indicating de-enhancement of a solid mass. This is highly likely to be a renal cell carcinoma, and is fully contained within the renal parenchyma rather than being exophytic. A portion of the mass abuts the collecting system.  The adrenal glands appear normal. No discrete urinary tract calculi are identified.  Stomach/Bowel: Right ileocolic anastomosis observed.  Small bowel folds in caliber appear reasonably well preserved including in the distal ileum approaching the ileocolic anastomosis. However, there is irregular wall thickening, mucosal enhancement, and adjacent mesenteric hyperemia in the distal descending colon, sigmoid colon, and rectum indicating active inflammation and likely Crohn's colitis. No abscess or extraluminal gas identified.  Vascular/Lymphatic: No tumor thrombus in the left renal vein. No pathologic adenopathy identified. Mild abdominal aortic atherosclerotic calcification.  Reproductive: Small uterine fibroids. Nabothian cysts along the cervix.  Other: No supplemental non-categorized findings.  Musculoskeletal: Stable sclerotic 1.3 by 0.9 cm bone island in the left iliac bone just above the acetabulum, image 62/2 (no change from 2010).  Intervertebral and facet spurring cause left foraminal impingement at L5-S1.  IMPRESSION: 1. Abnormal wall thickening, mucosal enhancement, and adjacent mesenteric hyperemia in the distal descending colon, sigmoid colon, and rectum compatible with active inflammation and likely Crohn's colitis. No abscess or extraluminal gas identified. 2. 3.0 cm solid mass in the left kidney upper pole, highly likely to be a renal cell carcinoma. No findings of venous tumor  thrombus, adenopathy, or metastatic disease. Urology referral recommended. 3. Other imaging findings of potential clinical significance: Small uterine fibroids. Left foraminal impingement at L5-S1 due to spurring. Aortic Atherosclerosis (ICD10-I70.0).  These results will be called to the ordering clinician or representative by the Radiologist Assistant, and communication documented in the PACS or zVision Dashboard.   Electronically Signed   By: WVan ClinesM.D.   On: 10/11/2019 14:57  I have personally reviewed the images and agree with radiologist interpretation. Compared to 2010 CT, there is haziness at the exact same location in her left kidney suggest that the mass may have been present at that time as well.  Assessment & Plan:    1. Left Renal cyst  A solid renal mass raises the suspicion of primary renal malignancy.  We discussed this in detail and in regards to the spectrum of renal masses which includes cysts (pure cysts are considered benign), solid masses and everything in between. The risk of metastasis increases as the size of solid renal mass increases. In general, it is believed that the risk of metastasis for renal masses less than 3-4 cm is small (up to approximately 5%) based  mainly on large retrospective studies.  In some cases and especially in patients of older age and multiple comorbidities a surveillance approach may be appropriate. The treatment of solid renal masses includes: surveillance, cryoablation (percutaneous and laparoscopic) in addition to partial and complete nephrectomy (each with option of laparoscopic, robotic and open depending on appropriateness). Furthermore, nephrectomy appears to be an independent risk factor for the development of chronic kidney disease suggesting that nephron sparing approaches should be implored whenever feasible. We reviewed these options in context of the patients current situation as well as the pros and cons of  each.  Based on the location of this lesion, if it is in fact renal cell carcinoma, would likely recommend radical nephrectomy given its extremely endophytic nature and proximity to the collecting system.  It does not appear to be easily resectable via partial.  Another some suggestion of this lesion may have been present in the remote past as well as need for more aggressive radical nephrectomy, I have strongly recommended renal mass biopsy to ensure that this is in fact a malignant tumor.  We discussed the risk of renal mass biopsy in detail primarily including the risk of discomfort and bleeding.  We will arrange for this in the near future.  We also discussed the risk of a sampling error or nondiagnostic biopsy.  Pt elected for biopsy and will be called regarding appointment   [facility] [address], [place], [place] [address] [phone]  I, Lucas Mallow, am acting as a scribe for Dr. Hollice Espy,  I have reviewed the above documentation for accuracy and completeness, and I agree with the above.   Hollice Espy, MD

## 2019-11-02 ENCOUNTER — Other Ambulatory Visit: Payer: Self-pay | Admitting: Radiology

## 2019-11-02 DIAGNOSIS — N2889 Other specified disorders of kidney and ureter: Secondary | ICD-10-CM

## 2019-11-02 LAB — MICROSCOPIC EXAMINATION

## 2019-11-02 LAB — URINALYSIS, COMPLETE
Bilirubin, UA: NEGATIVE
Glucose, UA: NEGATIVE
Ketones, UA: NEGATIVE
Leukocytes,UA: NEGATIVE
Nitrite, UA: NEGATIVE
Protein,UA: NEGATIVE
Specific Gravity, UA: <1.005 — ABNORMAL LOW (ref 1.005–1.030)
Urobilinogen, Ur: 0.2 mg/dL (ref 0.2–1.0)
pH, UA: 5.5 (ref 5.0–7.5)

## 2019-11-04 ENCOUNTER — Ambulatory Visit: Payer: BC Managed Care – PPO | Attending: Internal Medicine

## 2019-11-04 ENCOUNTER — Other Ambulatory Visit: Payer: Self-pay

## 2019-11-04 DIAGNOSIS — Z23 Encounter for immunization: Secondary | ICD-10-CM | POA: Insufficient documentation

## 2019-11-04 NOTE — Progress Notes (Signed)
   Covid-19 Vaccination Clinic  Name:  RUTHA MELGOZA    MRN: [id number] DOB: [birth year]  11/04/2019  Ms. Nibert was observed post Covid-19 immunization for 15 minutes without incidence. She was provided with Vaccine Information Sheet and instruction to access the V-Safe system.   Ms. Heiner was instructed to call 911 with any severe reactions post vaccine: Marland Kitchen Difficulty breathing  . Swelling of your face and throat  . A fast heartbeat  . A bad rash all over your body  . Dizziness and weakness    Immunizations Administered    Name Date Dose VIS Date Route   Moderna COVID-19 Vaccine 11/04/2019 12:52 PM 0.5 mL 08/08/2019 Intramuscular   Manufacturer: Moderna   Lot: [id number]   Luck: [id number]

## 2019-11-07 ENCOUNTER — Other Ambulatory Visit: Payer: Self-pay | Admitting: Radiology

## 2019-11-08 ENCOUNTER — Other Ambulatory Visit: Payer: Self-pay

## 2019-11-08 ENCOUNTER — Telehealth: Payer: Self-pay | Admitting: Urology

## 2019-11-08 ENCOUNTER — Ambulatory Visit
Admission: RE | Admit: 2019-11-08 | Discharge: 2019-11-08 | Disposition: A | Discharge status: Home / Self Care | Payer: BC Managed Care – PPO | Source: Ambulatory Visit | Attending: Urology | Admitting: Urology

## 2019-11-08 ENCOUNTER — Ambulatory Visit
Admission: RE | Admit: 2019-11-08 | Discharge: 2019-11-08 | Disposition: A | Discharge status: Home / Self Care | Payer: BC Managed Care – PPO | Source: Ambulatory Visit | Attending: Interventional Radiology | Admitting: Interventional Radiology

## 2019-11-08 ENCOUNTER — Other Ambulatory Visit: Payer: Self-pay | Admitting: Urology

## 2019-11-08 DIAGNOSIS — Z79899 Other long term (current) drug therapy: Secondary | ICD-10-CM | POA: Insufficient documentation

## 2019-11-08 DIAGNOSIS — Z880 Allergy status to penicillin: Secondary | ICD-10-CM | POA: Diagnosis not present

## 2019-11-08 DIAGNOSIS — N2889 Other specified disorders of kidney and ureter: Secondary | ICD-10-CM | POA: Diagnosis present

## 2019-11-08 DIAGNOSIS — Z882 Allergy status to sulfonamides status: Secondary | ICD-10-CM | POA: Diagnosis not present

## 2019-11-08 DIAGNOSIS — Y848 Other medical procedures as the cause of abnormal reaction of the patient, or of later complication, without mention of misadventure at the time of the procedure: Secondary | ICD-10-CM | POA: Diagnosis not present

## 2019-11-08 DIAGNOSIS — N99841 Postprocedural hematoma of a genitourinary system organ or structure following other procedure: Secondary | ICD-10-CM | POA: Diagnosis not present

## 2019-11-08 DIAGNOSIS — C642 Malignant neoplasm of left kidney, except renal pelvis: Secondary | ICD-10-CM | POA: Diagnosis not present

## 2019-11-08 DIAGNOSIS — K5090 Crohn's disease, unspecified, without complications: Secondary | ICD-10-CM | POA: Insufficient documentation

## 2019-11-08 LAB — CBC
HCT: 42.6 % (ref 36.0–46.0)
Hemoglobin: 14.4 g/dL (ref 12.0–15.0)
MCH: 31 pg (ref 26.0–34.0)
MCHC: 33.8 g/dL (ref 30.0–36.0)
MCV: 91.6 fL (ref 80.0–100.0)
Platelets: 323 10*3/uL (ref 150–400)
RBC: 4.65 MIL/uL (ref 3.87–5.11)
RDW: 13.1 % (ref 11.5–15.5)
WBC: 5.6 10*3/uL (ref 4.0–10.5)
nRBC: 0 % (ref 0.0–0.2)

## 2019-11-08 LAB — PROTIME-INR
INR: 1 (ref 0.8–1.2)
Prothrombin Time: 13.1 seconds (ref 11.4–15.2)

## 2019-11-08 IMAGING — CT CT BIOPSY
3 of 7 series · 12 of 32 positions shown, 17 images · non-contrast
Comparison: CT abdomen and pelvis-10/11/2019

INDICATION: No known primary, now with indeterminate right-sided renal lesion.
Please perform image guided biopsy for tissue diagnostic purposes.

EXAM:
ULTRASOUND AND CT-GUIDED RIGHT RENAL LESION BIOPSY

[Series 2: i-spiral 5.0 b30f · axial · 0.59mm/px · z∈[+765,+877]mm · 5 of 49 slices shown, 10 images (1 of 3)]
[im 9/49  soft-tissue]
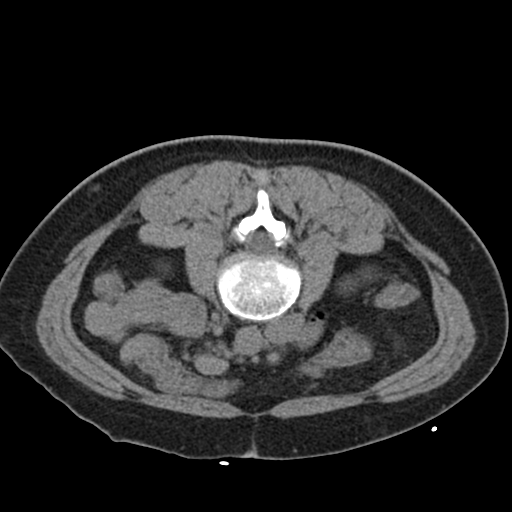
[im 9/49  bone]
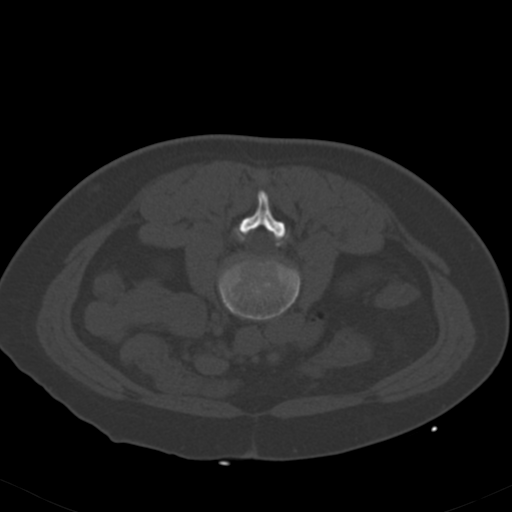
[im 17/49  soft-tissue]
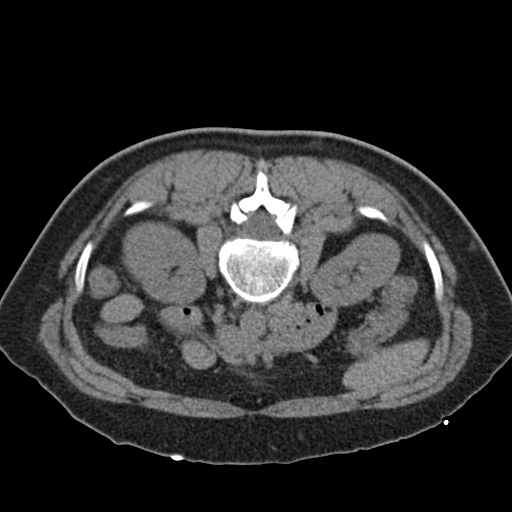
[im 17/49  lung]
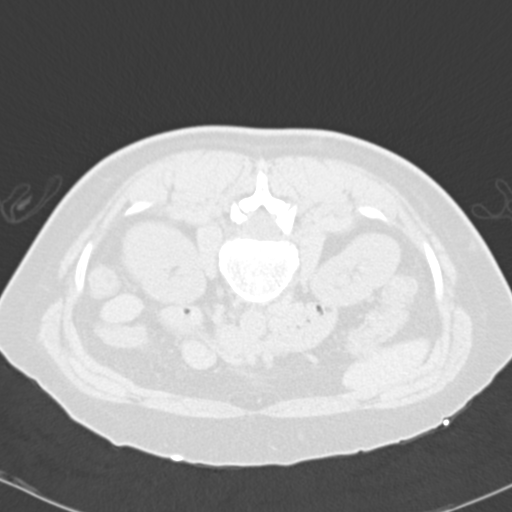
[im 25/49  soft-tissue]
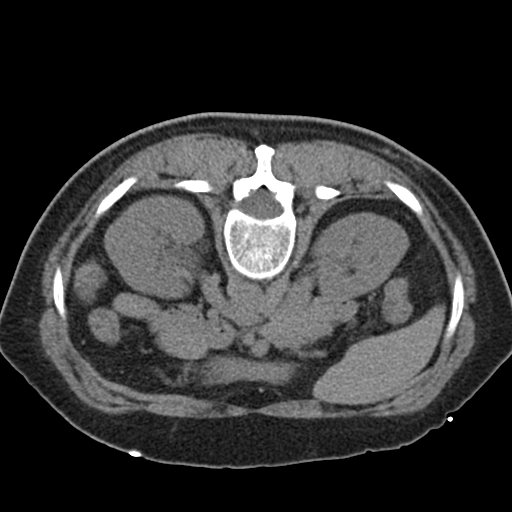
[im 25/49  lung]
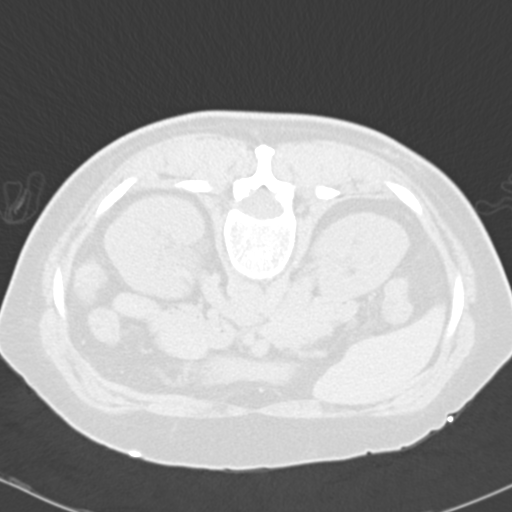
[im 33/49  soft-tissue]
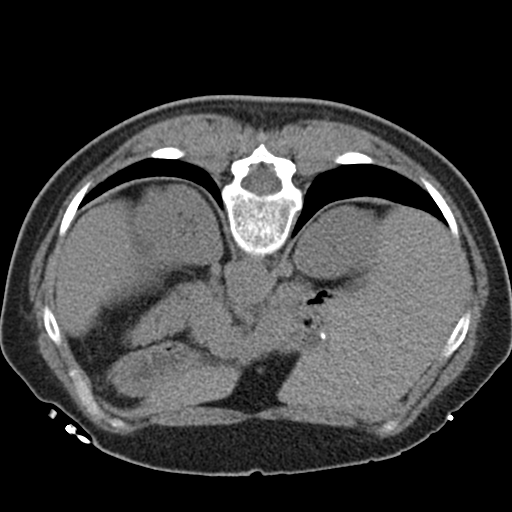
[im 33/49  lung]
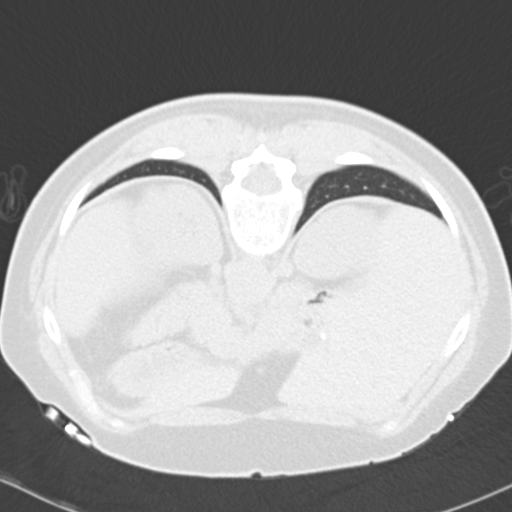
[im 41/49  soft-tissue]
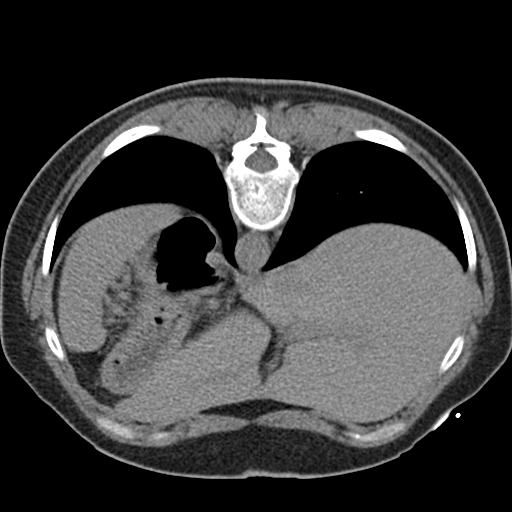
[im 41/49  lung]
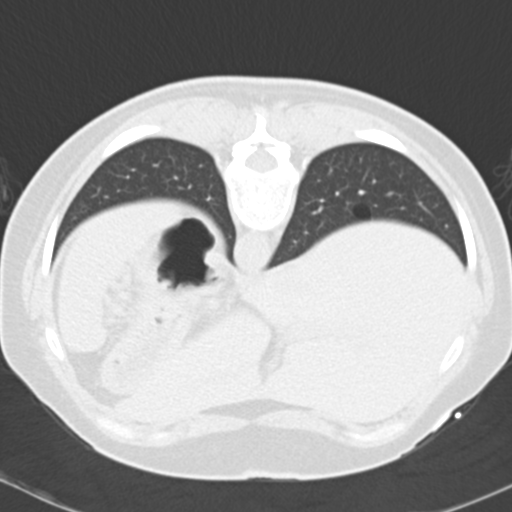

[Series 4: i-spiral 5.0 b30f · axial · 0.59mm/px · z∈[+777,+861]mm · 4 of 40 slices shown (2 of 3)]
[im 8/40  soft-tissue]
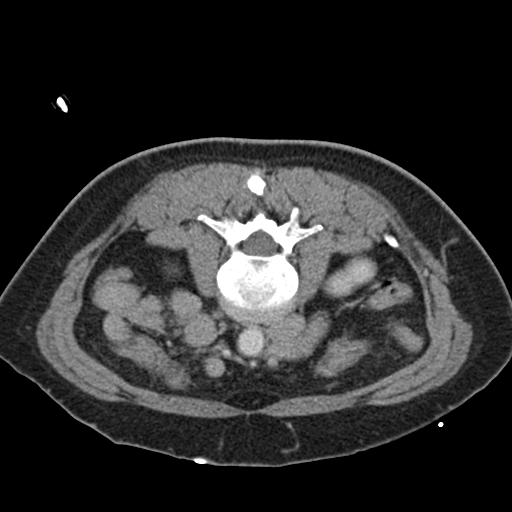
[im 16/40  soft-tissue]
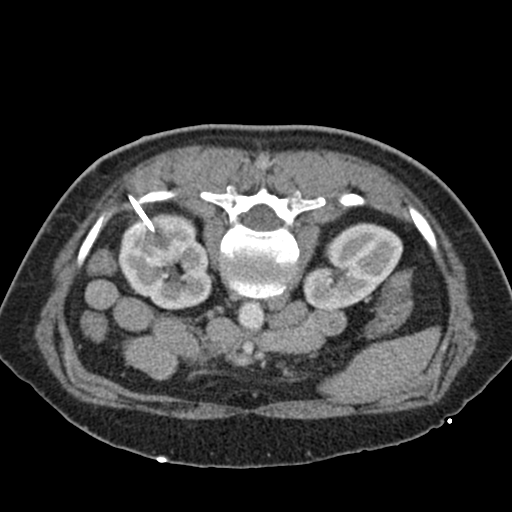
[im 24/40  soft-tissue]
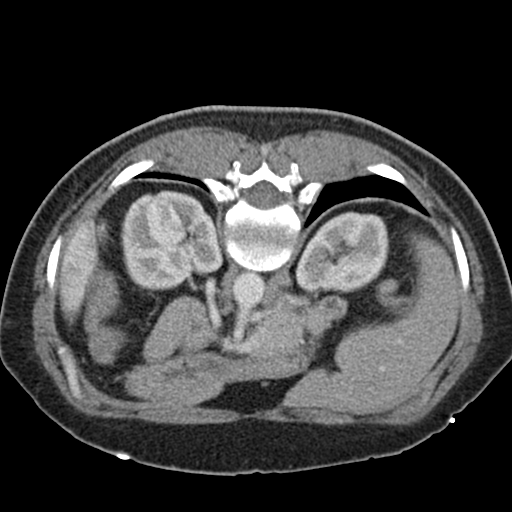
[im 32/40  soft-tissue]
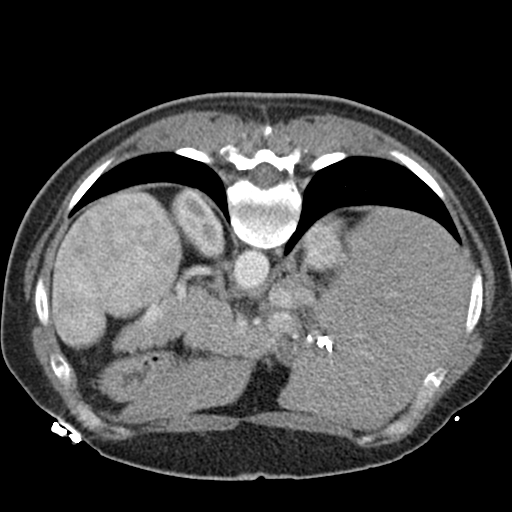

[Series 5: i-spiral 5.0 b30f · axial · 0.59mm/px · z∈[+784,+836]mm · 3 of 38 slices shown (3 of 3)]
[im 8/38  soft-tissue]
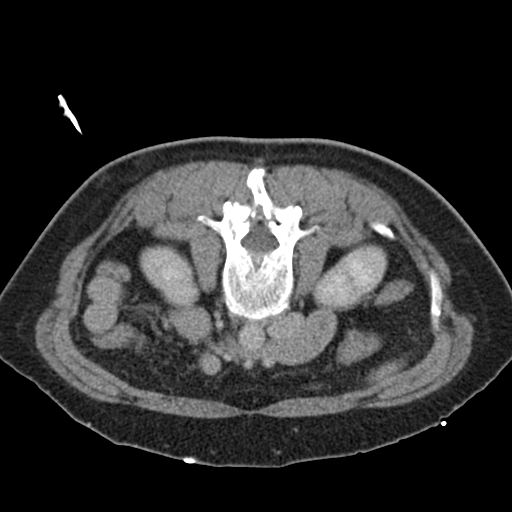
[im 15/38  soft-tissue]
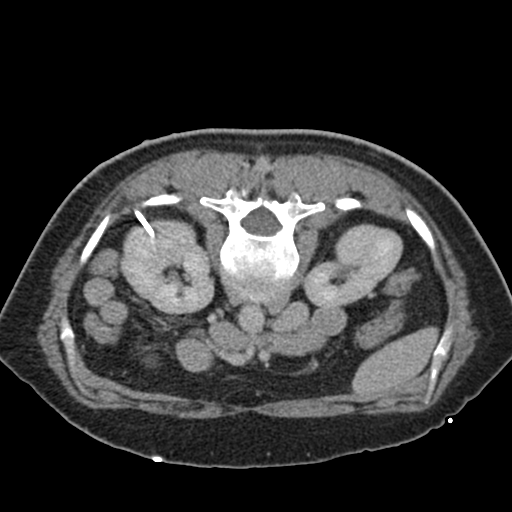
[im 23/38  soft-tissue]
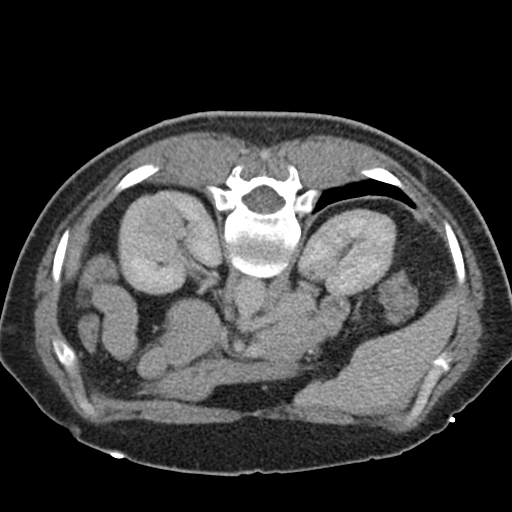

[12 of 32 positions shown; findings below may reference images not displayed]

MEDICATIONS:
None.

ANESTHESIA/SEDATION:
Fentanyl 150 mcg IV; Versed 2 mg IV

Sedation time: 30 minutes; The patient was continuously monitored
during the procedure by the interventional radiology nurse under my
direct supervision.

CONTRAST:  60 cc Isovue-GJJ

COMPLICATIONS:
SIR Level A - No therapy, no consequence.

Procedure complicated by development of a moderate-sized right-sided
perinephric hematoma.

PROCEDURE:
Informed consent was obtained from the patient following an
explanation of the procedure, risks, benefits and alternatives. A
time out was performed prior to the initiation of the procedure.

The patient was positioned prone on the CT table and a limited CT
was performed for procedural planning demonstrating the right kidney
but poorly visualizing the endophytic hypoenhancing lesion within
the superior pole the left kidney.

Ultrasound was utilized to identify the left kidney as well as a
approximately 2.0 x 1.7 cm echogenic lesion which was felt to
represent left renal lesion in question. As such, the skin overlying
the left flank was prepped and draped in usual sterile fashion.

Under direct ultrasound guidance, a 17 gauge coaxial needle was
advanced into the questioned echogenic left renal lesion.

At this time, intravenous contrast was administered and portal
venous and delayed phase imaging was obtained.

This point it became clear the initial questioned lesion did not
correlate with the hypoenhancing lesion within the superior pole
left kidney. As such, under direct ultrasound guidance, the coaxial
needle was advanced towards the hypoattenuating lesion. Appropriate
positioning was confirmed with CT imaging two 18 gauge core needle
biopsies were obtained.

The co-axial needle was removed following the administration of a
Gel-Foam slurry and superficial hemostasis was achieved with manual
compression.

Postprocedural imaging demonstrated moderate-sized perinephric
hematoma however patient remained hemodynamically stable throughout
prolonged observation within the recovery unit.

The patient otherwise tolerated the procedure well without immediate
postprocedural complication.
IMPRESSION: Technically successful ultrasound and CT guided biopsy of
ill-defined hypoattenuating lesion within the superior pole of the
left kidney necessitating the administration of intravenous contrast
for lesion location confirmation purposes.

Procedure complicated by development of a moderate-sized perinephric
hematoma, however the patient remained hemodynamically stable
throughout prolonged observation within the recovery unit.

Given patient's hemodynamic stability as well as desire NOT to be
admitted to hospital as we remain in the midst of the COVID 19
pandemic, patient was discharged home however was given explicit
instructions to return to the emergency department if she were to
experience worsening flank pain and/or lightheadedness.

The patient and the patient's husband demonstrated excellent
understanding of this conversation and are in agreement with the
proposed plan of care.

## 2019-11-08 IMAGING — US US INTRAOPERATIVE - NO REPORT
1 series · 8 of 8 positions shown · non-contrast
Comparison: CT abdomen and pelvis-10/11/2019

INDICATION: No known primary, now with indeterminate right-sided renal lesion.
Please perform image guided biopsy for tissue diagnostic purposes.

EXAM:
ULTRASOUND AND CT-GUIDED RIGHT RENAL LESION BIOPSY

[Series 1: us intraoperative - no report · 0.17mm/px · 8 of 8 slices shown]
[im 1/8]
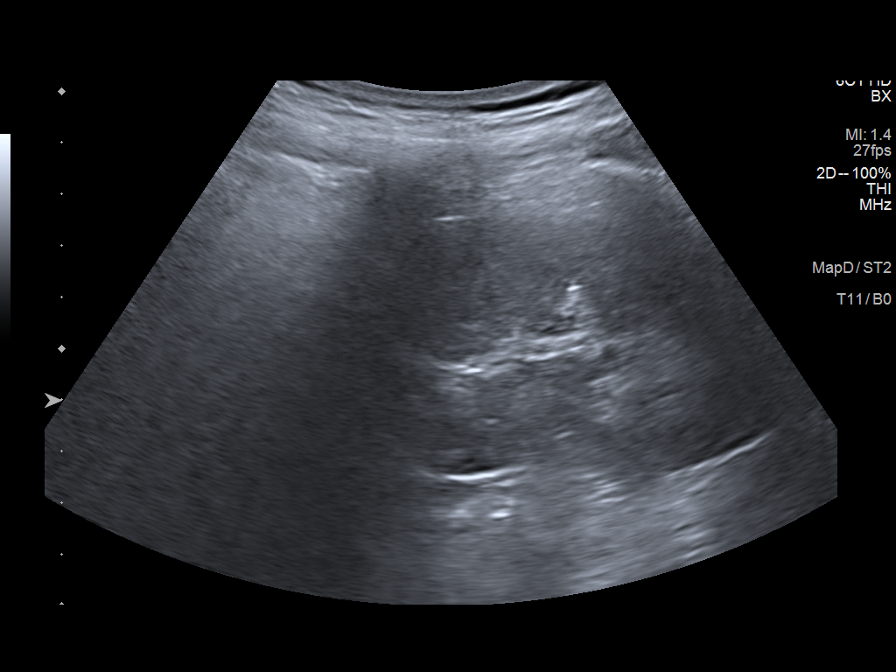
[im 2/8]
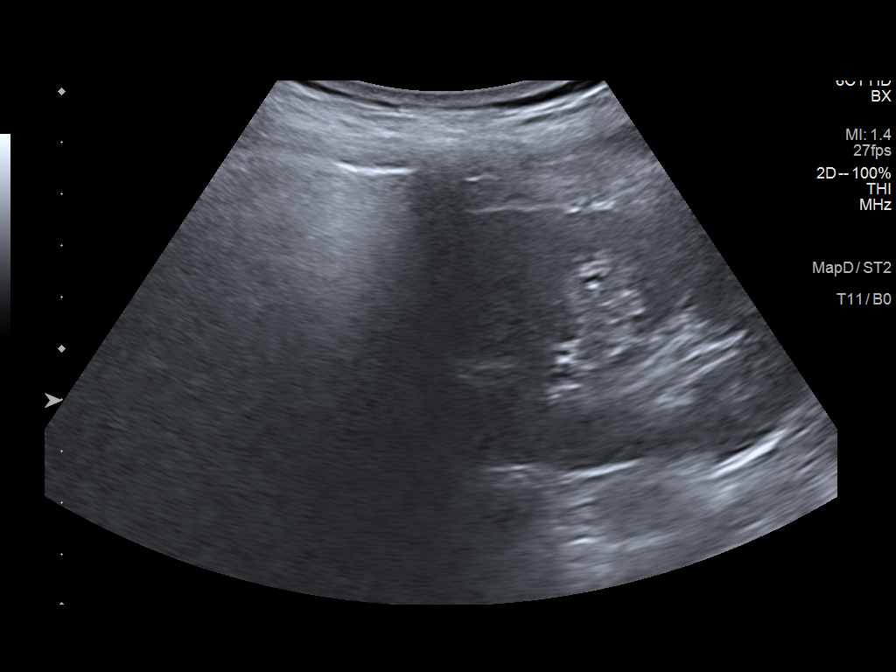
[im 3/8]
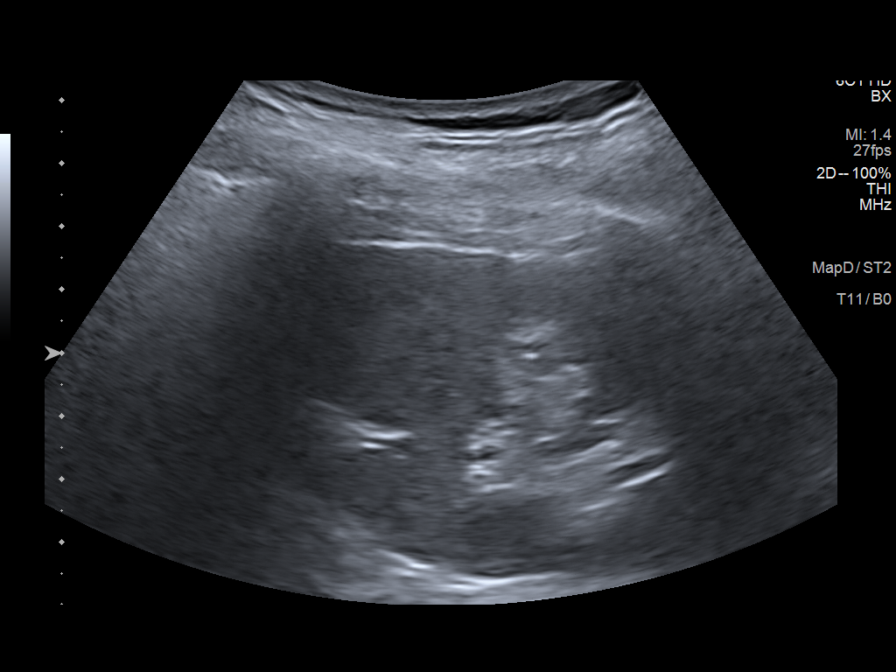
[im 4/8]
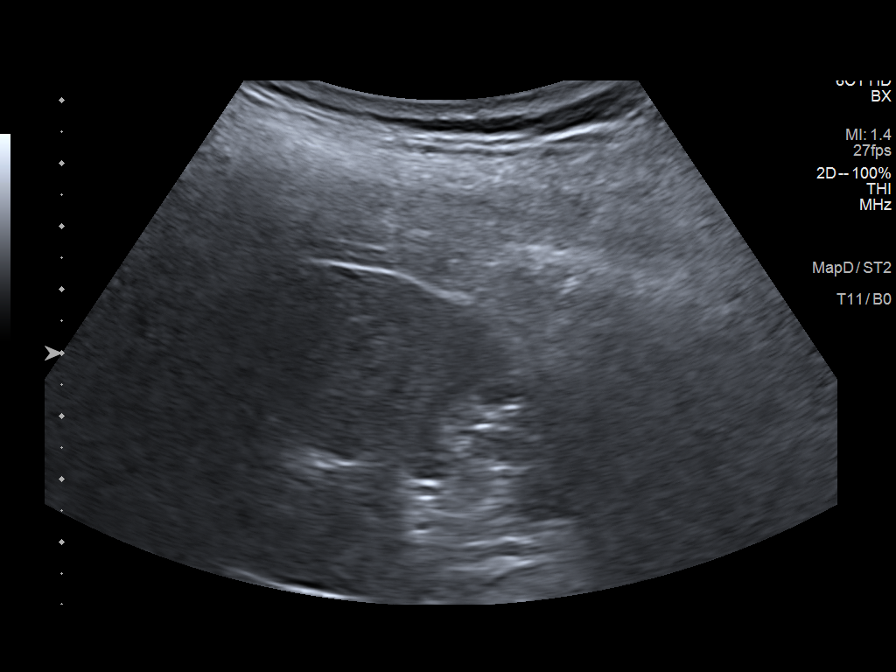
[im 5/8]
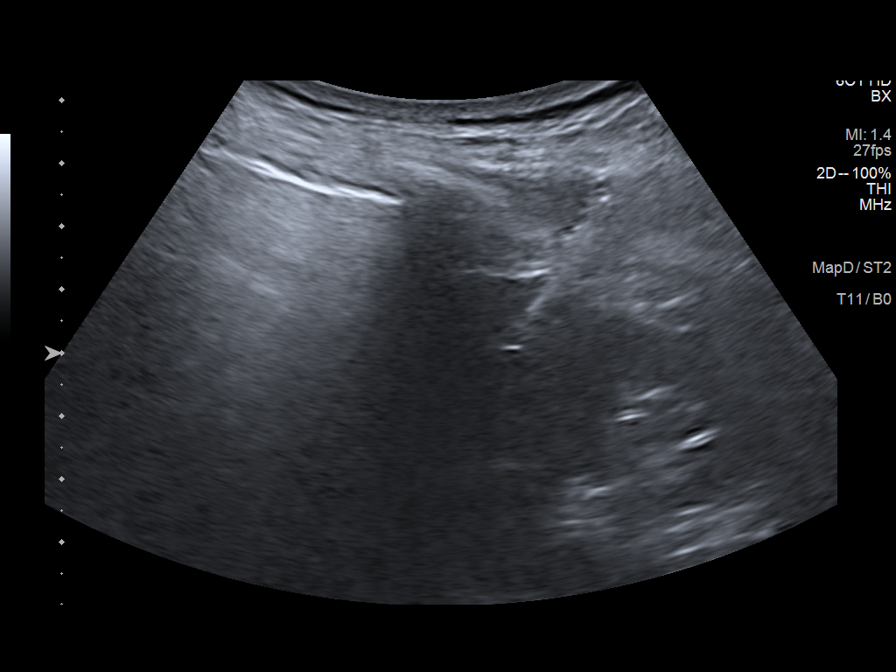
[im 6/8]
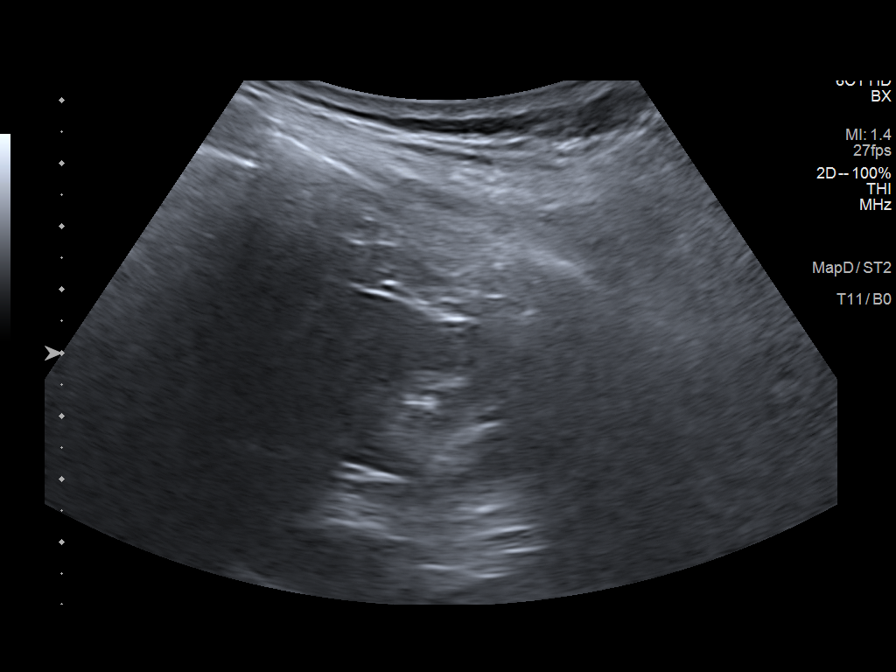
[im 7/8]
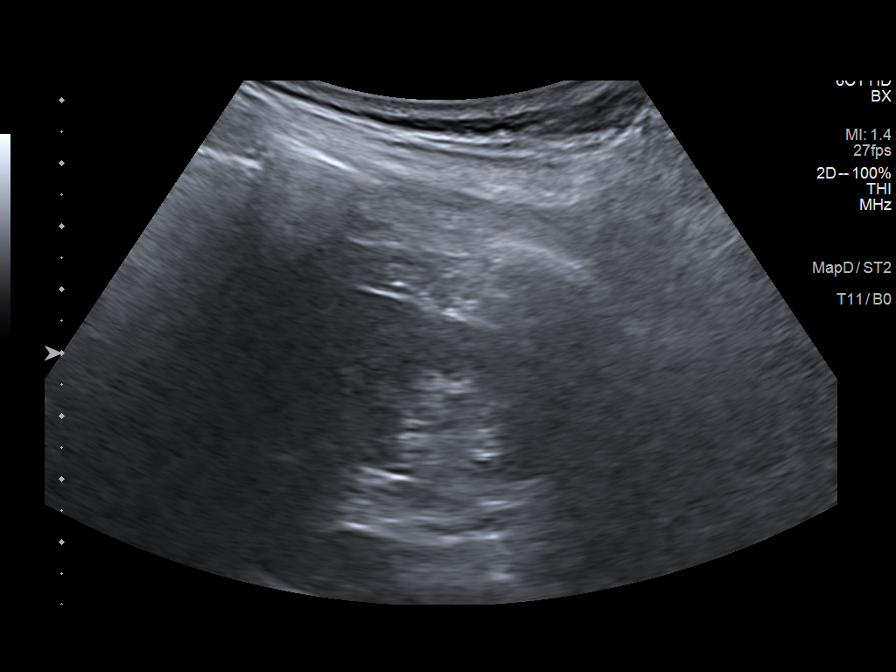
[im 8/8]
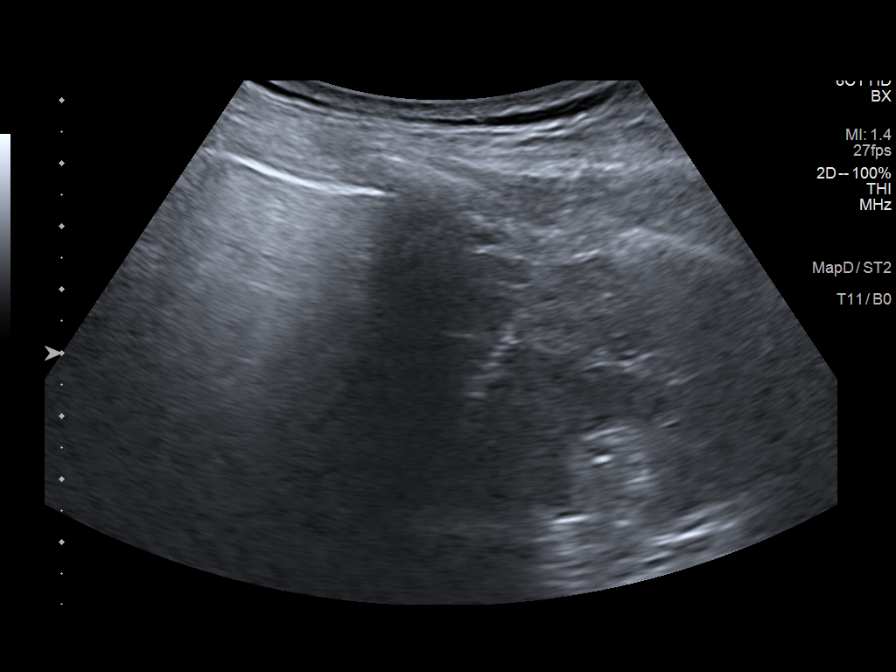

[8 of 8 positions shown; findings below may reference images not displayed]

MEDICATIONS:
None.

ANESTHESIA/SEDATION:
Fentanyl 150 mcg IV; Versed 2 mg IV

Sedation time: 30 minutes; The patient was continuously monitored
during the procedure by the interventional radiology nurse under my
direct supervision.

CONTRAST:  60 cc Isovue-GJJ

COMPLICATIONS:
SIR Level A - No therapy, no consequence.

Procedure complicated by development of a moderate-sized right-sided
perinephric hematoma.

PROCEDURE:
Informed consent was obtained from the patient following an
explanation of the procedure, risks, benefits and alternatives. A
time out was performed prior to the initiation of the procedure.

The patient was positioned prone on the CT table and a limited CT
was performed for procedural planning demonstrating the right kidney
but poorly visualizing the endophytic hypoenhancing lesion within
the superior pole the left kidney.

Ultrasound was utilized to identify the left kidney as well as a
approximately 2.0 x 1.7 cm echogenic lesion which was felt to
represent left renal lesion in question. As such, the skin overlying
the left flank was prepped and draped in usual sterile fashion.

Under direct ultrasound guidance, a 17 gauge coaxial needle was
advanced into the questioned echogenic left renal lesion.

At this time, intravenous contrast was administered and portal
venous and delayed phase imaging was obtained.

This point it became clear the initial questioned lesion did not
correlate with the hypoenhancing lesion within the superior pole
left kidney. As such, under direct ultrasound guidance, the coaxial
needle was advanced towards the hypoattenuating lesion. Appropriate
positioning was confirmed with CT imaging two 18 gauge core needle
biopsies were obtained.

The co-axial needle was removed following the administration of a
Gel-Foam slurry and superficial hemostasis was achieved with manual
compression.

Postprocedural imaging demonstrated moderate-sized perinephric
hematoma however patient remained hemodynamically stable throughout
prolonged observation within the recovery unit.

The patient otherwise tolerated the procedure well without immediate
postprocedural complication.
IMPRESSION: Technically successful ultrasound and CT guided biopsy of
ill-defined hypoattenuating lesion within the superior pole of the
left kidney necessitating the administration of intravenous contrast
for lesion location confirmation purposes.

Procedure complicated by development of a moderate-sized perinephric
hematoma, however the patient remained hemodynamically stable
throughout prolonged observation within the recovery unit.

Given patient's hemodynamic stability as well as desire NOT to be
admitted to hospital as we remain in the midst of the COVID 19
pandemic, patient was discharged home however was given explicit
instructions to return to the emergency department if she were to
experience worsening flank pain and/or lightheadedness.

The patient and the patient's husband demonstrated excellent
understanding of this conversation and are in agreement with the
proposed plan of care.

## 2019-11-08 MED ORDER — FENTANYL CITRATE (PF) 100 MCG/2ML IJ SOLN
INTRAMUSCULAR | Status: AC | PRN
  Administered 2019-11-08 (×3): 50 ug via INTRAVENOUS

## 2019-11-08 MED ORDER — OXYCODONE-ACETAMINOPHEN 5-325 MG PO TABS
2.0000 | ORAL_TABLET | Freq: Once | ORAL | Status: AC
  Filled 2019-11-08: qty 2

## 2019-11-08 MED ORDER — OXYCODONE-ACETAMINOPHEN 5-325 MG PO TABS
ORAL_TABLET | ORAL | Status: AC
  Administered 2019-11-08: 2 via ORAL
  Filled 2019-11-08: qty 2

## 2019-11-08 MED ORDER — ONDANSETRON 4 MG PO TBDP
4.0000 mg | ORAL_TABLET | Freq: Once | ORAL | Status: AC
  Filled 2019-11-08: qty 1

## 2019-11-08 MED ORDER — ONDANSETRON 4 MG PO TBDP
ORAL_TABLET | ORAL | Status: AC
  Administered 2019-11-08: 4 mg via ORAL
  Filled 2019-11-08: qty 1

## 2019-11-08 MED ORDER — FENTANYL CITRATE (PF) 100 MCG/2ML IJ SOLN
INTRAMUSCULAR | Status: AC
  Filled 2019-11-08: qty 4

## 2019-11-08 MED ORDER — SODIUM CHLORIDE 0.9 % IV SOLN: INTRAVENOUS | Status: DC

## 2019-11-08 MED ORDER — MIDAZOLAM HCL 2 MG/2ML IJ SOLN
INTRAMUSCULAR | Status: AC
  Filled 2019-11-08: qty 4

## 2019-11-08 MED ORDER — IOHEXOL 300 MG/ML  SOLN
100.0000 mL | Freq: Once | INTRAMUSCULAR | Status: AC | PRN
  Administered 2019-11-08: 60 mL via INTRAVENOUS

## 2019-11-08 MED ORDER — MIDAZOLAM HCL 2 MG/2ML IJ SOLN
INTRAMUSCULAR | Status: AC | PRN
  Administered 2019-11-08 (×2): 1 mg via INTRAVENOUS

## 2019-11-08 NOTE — Procedures (Signed)
Pre procedural Dx: Indeterminate left sided renal lesion.  Post procedural Dx: Same  Technically successful [place] and CT guided biopsy of indeterminate left sided renal lesion   EBL: Minimal Complications: Procedure complicated by development of moderate size left sided perinephric hematoma.  Ronny Bacon, MD Pager #: [phone]

## 2019-11-08 NOTE — Consult Note (Signed)
Chief Complaint: Indeterminate left-sided renal lesion  Referring Physician(s): Brandon,Ashley  Patient Status: [facility] - Out-pt  History of Present Illness: Chelsea Hicks is a 54 y.o. female with past medical history significant for Crohn's disease who was found to have a indeterminate enhancing left-sided renal lesion worrisome for renal cell carcinoma.  Patient presents today for image guided left-sided renal lesion biopsy for tissue diagnostic purposes as the location of the lesion precludes the possibility of partial nephrectomy or cryoablation  Patient is currently without complaint.  Specifically, no flank pain or hematuria.  No unintentional weight loss or weight gain.  No chest pain, shortness of breath, fever or chills.  Past Medical History:  Diagnosis Date  . 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 Crohn's disease ([place]) 2005  . Diffuse cystic mastopathy 2013  . Herpes zoster without complication 12/14/2008  . Hypertrophic lichen planus   . Lichen simplex chronicus   . Regional enteritis of unspecified site   . Special screening for malignant neoplasms, colon     Past Surgical History:  Procedure Laterality Date  . bowel rescetion  07/2009  . BREAST BIOPSY Left 02-11-04   core bx,benign  . CHOLECYSTECTOMY  12/2001  . COLONOSCOPY  2011   Dr. Vira Agar  . DILATION AND CURETTAGE OF UTERUS    . hemoglobin electrophinesis    . LIPOMA EXCISION  2009   from back  . LIPOMA EXCISION Left 07/18/2012   arm  . TUBAL LIGATION      Allergies: Amoxicillin-pot clavulanate, Penicillins, and Sulfonamide derivatives  Medications: Prior to Admission medications   Medication Sig Start Date End Date Taking? Authorizing Provider  azathioprine (IMURAN) 100 MG tablet Take 150 mg by mouth daily.   Yes [provider]  Iron-Vitamin C (IRON 100/C) 100-250 MG TABS  08/25/18  Yes [provider]  Multiple Vitamin (MULTIVITAMIN) tablet  Take 1 tablet by mouth daily.   Yes [provider]  VITAMIN D PO Take by mouth.   Yes [provider]     Family History  Problem Relation Age of Onset  . Cancer Maternal Grandfather        prostate  . Cancer Paternal Grandmother        breast  . Breast cancer Paternal Grandmother     Social History   Socioeconomic History  . Marital status: Married    Spouse name: Not on file  . Number of children: 2  . Years of education: Not on file  . Highest education level: Not on file  Occupational History  . 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 Smoking status: Never Smoker  . Smokeless tobacco: Never Used  Substance and Sexual Activity  . Alcohol use: Yes    Alcohol/week: 0.0 standard drinks    Comment: occasionally  . Drug use: No  . Sexual activity: Not on file  Other Topics Concern  . Not on file  Social History Narrative   Attending [employer] getting Undergraduate in Early Childhood   Social Determinants of Health   Financial Resource Strain:   . Difficulty of Paying Living Expenses: Not on file  Food Insecurity:   . Worried About Charity fundraiser in the Last Year: Not on file  . Ran Out of Food in the Last Year: Not on file  Transportation Needs:   . Lack of Transportation (Medical): Not on file  . Lack of Transportation (Non-Medical): Not on file  Physical Activity:   .  Days of Exercise per Week: Not on file  . Minutes of Exercise per Session: Not on file  Stress:   . Feeling of Stress : Not on file  Social Connections:   . Frequency of Communication with Friends and Family: Not on file  . Frequency of Social Gatherings with Friends and Family: Not on file  . Attends Religious Services: Not on file  . Active Member of Clubs or Organizations: Not on file  . Attends Archivist Meetings: Not on file  . Marital Status: Not on file    ECOG Status: 0 - Asymptomatic  Review of Systems: A 12 point ROS discussed and pertinent  positives are indicated in the HPI above.  All other systems are negative.  Review of Systems  Vital Signs: BP 126/76   Pulse 81   Temp 98.3 F (36.8 C) (Oral)   Resp 17   Ht 5' 5"  (1.651 m)   Wt 69.4 kg   LMP 11/06/2019 Comment: Tubal Ligation  SpO2 100%   BMI 25.46 kg/m   Physical Exam  Imaging: CT ENTERO ABD/PELVIS W CONTAST  Result Date: 10/11/2019 CLINICAL DATA:  Assessment for active Crohn's disease. Prior bowel resection related to Crohn's disease. EXAM: CT ABDOMEN AND PELVIS WITH CONTRAST (ENTEROGRAPHY) TECHNIQUE: Multidetector CT of the abdomen and pelvis during bolus administration of intravenous contrast. Negative oral contrast was given. CONTRAST:  125m OMNIPAQUE IOHEXOL 300 MG/ML  SOLN COMPARISON:  07/02/2009 FINDINGS: Lower chest:  Unremarkable Hepatobiliary: Cholecystectomy. Otherwise unremarkable. Pancreas: Unremarkable Spleen: Unremarkable Adrenals/Urinary Tract: In the left kidney upper pole, a 3.0 by 1.9 by 1.9 cm (volume = 5.7 cm^3) mass has a portal venous phase density of 154 Hounsfield units and a delayed phase density of 108 Hounsfield units, indicating de-enhancement of a solid mass. This is highly likely to be a renal cell carcinoma, and is fully contained within the renal parenchyma rather than being exophytic. A portion of the mass abuts the collecting system. The adrenal glands appear normal. No discrete urinary tract calculi are identified. Stomach/Bowel: Right ileocolic anastomosis observed. Small bowel folds in caliber appear reasonably well preserved including in the distal ileum approaching the ileocolic anastomosis. However, there is irregular wall thickening, mucosal enhancement, and adjacent mesenteric hyperemia in the distal descending colon, sigmoid colon, and rectum indicating active inflammation and likely Crohn's colitis. No abscess or extraluminal gas identified. Vascular/Lymphatic: No tumor thrombus in the left renal vein. No pathologic adenopathy  identified. Mild abdominal aortic atherosclerotic calcification. Reproductive: Small uterine fibroids. Nabothian cysts along the cervix. Other: No supplemental non-categorized findings. Musculoskeletal: Stable sclerotic 1.3 by 0.9 cm bone island in the left iliac bone just above the acetabulum, image 62/2 (no change from 2010). Intervertebral and facet spurring cause left foraminal impingement at L5-S1. IMPRESSION: 1. Abnormal wall thickening, mucosal enhancement, and adjacent mesenteric hyperemia in the distal descending colon, sigmoid colon, and rectum compatible with active inflammation and likely Crohn's colitis. No abscess or extraluminal gas identified. 2. 3.0 cm solid mass in the left kidney upper pole, highly likely to be a renal cell carcinoma. No findings of venous tumor thrombus, adenopathy, or metastatic disease. Urology referral recommended. 3. Other imaging findings of potential clinical significance: Small uterine fibroids. Left foraminal impingement at L5-S1 due to spurring. Aortic Atherosclerosis (ICD10-I70.0). These results will be called to the ordering clinician or representative by the Radiologist Assistant, and communication documented in the PACS or zVision Dashboard. Electronically Signed   By: WVan ClinesM.D.   On: 10/11/2019 14:57  Labs:  CBC: Recent Labs    11/08/19 1014  WBC 5.6  HGB 14.4  HCT 42.6  PLT 323    COAGS: Recent Labs    11/08/19 1014  INR 1.0    BMP: No results for input(s): NA, K, CL, CO2, GLUCOSE, BUN, CALCIUM, CREATININE, GFRNONAA, GFRAA in the last 8760 hours.  Invalid input(s): CMP  LIVER FUNCTION TESTS: No results for input(s): BILITOT, AST, ALT, ALKPHOS, PROT, ALBUMIN in the last 8760 hours.  TUMOR MARKERS: No results for input(s): AFPTM, CEA, CA199, CHROMGRNA in the last 8760 hours.  Assessment and Plan:  Chelsea Hicks is a 54 y.o. female with past medical history significant for Crohn's disease who was found to have a  indeterminate enhancing left-sided renal lesion worrisome for renal cell carcinoma.  Patient presents today for image guided left-sided renal lesion biopsy for tissue diagnostic purposes as the location of the lesion precludes the possibility of partial nephrectomy or cryoablation  Patient is currently without complaint.   Risks and benefits of image guided left-sided renal lesion biopsy was discussed with the patient and/or patient's family including, but not limited to bleeding, infection, damage to adjacent structures or low yield requiring additional tests.  All of the questions were answered and there is agreement to proceed.  Consent signed and in chart.  Thank you for this interesting consult.  I greatly enjoyed meeting Kinzleigh S Knightly and look forward to participating in their care.  A copy of this report was sent to the requesting provider on this date.  Electronically Signed: Sandi Mariscal, MD 11/08/2019, 11:40 AM   I spent a total of 15 Minutes in face to face in clinical consultation, greater than 50% of which was counseling/coordinating care for image guided left-sided renal lesion biopsy

## 2019-11-08 NOTE — Telephone Encounter (Signed)
This patient had a renal mass biopsy today.  Could you please call and check on her tomorrow (Thursday)?  Hollice Espy, MD

## 2019-11-09 ENCOUNTER — Other Ambulatory Visit: Payer: Self-pay | Admitting: Anatomic Pathology & Clinical Pathology

## 2019-11-09 LAB — SURGICAL PATHOLOGY

## 2019-11-09 NOTE — Telephone Encounter (Signed)
Patient states that she is doing well today, a little sore but overall doing well.

## 2019-11-14 NOTE — H&P (View-Only) (Signed)
11/15/19 1:27 PM   Chelsea Hicks 06/22/1966 [id number]  Referring provider: Pleas Koch, NP [place],  [place] [address]  Chief Complaint  Patient presents with  . renal mass    biopsy results    HPI: Chelsea Hicks is a 54 y.o. white F with a history of Crohn's disease and renal mass who returns today for post biopsy results. Her husband, [name], is present via phone.   On 10/11/19 she underwent a screening CT scan given her history of Crohn's disease.  This indicated an incidental 3.0 cm left endophytic solid enhancing renal mass concerning for possible renal cell carcinoma.  She returns today for follow-up following renal mass biopsy.  She did receive IV contrast as the lesion itself was very difficult to identify even on CT scan.  Ultimately the procedure was successful.  There was concern for perinephric hematoma.  Surgical pathology report from 11/08/19 shows tumor measuring at least 3 cm in biopsy sample and renal cell carcinoma grade 2. No evidence of sacromatoid or rhabdoid features.   Pt denies use of NSAIDs and drinks plenty of water. She does not do heavy lifting.   She has a previous SHx of a cholecystectomy and bowel resection.  PMH: Past Medical History:  Diagnosis Date  . 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 Crohn's disease ([place]) 2005  . Diffuse cystic mastopathy 2013  . Herpes zoster without complication 09/14/15  . Hypertrophic lichen planus   . Lichen simplex chronicus   . Regional enteritis of unspecified site   . Special screening for malignant neoplasms, colon     Surgical History: Past Surgical History:  Procedure Laterality Date  . bowel rescetion  07/2009  . BREAST BIOPSY Left 02-11-04   core bx,benign  . CHOLECYSTECTOMY  12/2001  . COLONOSCOPY  2011   Dr. Vira Agar  . DILATION AND CURETTAGE OF UTERUS    . hemoglobin electrophinesis    . LIPOMA EXCISION  2009   from back  . LIPOMA  EXCISION Left 07/18/2012   arm  . TUBAL LIGATION      Home Medications:  Allergies as of 11/15/2019      Reactions   Amoxicillin-pot Clavulanate    REACTION: hives   Penicillins Hives   Sulfonamide Derivatives    REACTION: hives      Medication List       Accurate as of November 15, 2019  1:27 PM. If you have any questions, ask your nurse or doctor.        azathioprine 100 MG tablet Commonly known as: IMURAN Take 150 mg by mouth daily.   Iron 100/C 100-250 MG Tabs Generic drug: Iron-Vitamin C   multivitamin tablet Take 1 tablet by mouth daily.   vitamin B-12 100 MCG tablet Commonly known as: CYANOCOBALAMIN Take 100 mcg by mouth daily.   VITAMIN D PO Take by mouth.       Allergies:  Allergies  Allergen Reactions  . Amoxicillin-Pot Clavulanate     REACTION: hives  . Penicillins Hives  . Sulfonamide Derivatives     REACTION: hives    Family History: Family History  Problem Relation Age of Onset  . Cancer Maternal Grandfather        prostate  . Cancer Paternal Grandmother        breast  . Breast cancer Paternal Grandmother     Social History:  reports that she has never smoked. She has  never used smokeless tobacco. She reports current alcohol use. She reports that she does not use drugs.   Physical Exam: BP (!) 145/81   Pulse (!) 112   Ht 5' 5"  (1.651 m)   Wt 152 lb (68.9 kg)   LMP 11/06/2019 Comment: Tubal Ligation  BMI 25.29 kg/m   Constitutional:  Alert and oriented, No acute distress.  Husband is available by telephone today. HEENT: [address] AT, moist mucus membranes.  Trachea midline, no masses. Cardiovascular: No clubbing, cyanosis, or edema. Respiratory: Normal respiratory effort, no increased work of breathing. Skin: No rashes, bruises or suspicious lesions. Neurologic: Grossly intact, no focal deficits, moving all 4 extremities. Psychiatric: Normal mood and affect.  Laboratory Data: SURGICAL PATHOLOGY  CASE: [id number]  PATIENT:  Chelsea Hicks   Surgical Pathology Report   Specimen Submitted:  A. Kidney, left; bx   Clinical History: No known primary, now with indeterminate left sided  renal lesion, post image guided bx.     DIAGNOSIS:  A. KIDNEY, LEFT; CT-GUIDED CORE NEEDLE BIOPSY:  - RENAL CELL CARCINOMA, CONVENTIONAL CLEAR CELL TYPE, GRADE 2  (WHO/ISUP).   Comment:  The tumor measures at least 8.5 mm in this core needle biopsy sample.  There is no evidence of sarcomatoid or rhabdoid features.   There is sufficient tissue for ancillary testing if desired.   The findings were communicated to Dr. Erlene Quan at 12:45 p.m. on 11/09/2019  via [facility] secure chat.   GROSS DESCRIPTION:  A. Labeled: Labeled with the patient's name and date of birth (per  requisition, left sided renal lesion)  Received: Formalin  Tissue fragment(s): 3  Size: Ranging from 0.8 to 1.1 cm in length by 0.1 cm in diameter  Description: Received are needle core biopsy fragments of yellow-red  soft tissue. The specimen is wrapped in lens paper and placed into mesh  bags.  Entirely submitted in cassettes 1-3.     Final Diagnosis performed by Allena Napoleon, MD.  Electronically signed  11/09/2019 1:00:49PM  The electronic signature indicates that the named Attending Pathologist  has evaluated the specimen  Technical component performed at [facility], [address] [place],  [place] [address] Lab: [phone] Dir: Rush Farmer, MD, MMM  Professional component performed at [facility], [facility], [place], [place], [place] [address] Lab: [phone]  Dir: Dellia Nims. Reuel Derby, MD   I have reviewed surgical pathology report.   Pertinent Imaging: INDICATION: No known primary, now with indeterminate right-sided renal lesion. Please perform image guided biopsy for tissue diagnostic purposes.  EXAM: ULTRASOUND AND CT-GUIDED RIGHT RENAL LESION BIOPSY  COMPARISON:  CT abdomen and pelvis-10/11/2019   MEDICATIONS: None.  ANESTHESIA/SEDATION: Fentanyl 150 mcg IV; Versed 2 mg IV  Sedation time: 30 minutes; The patient was continuously monitored during the procedure by the interventional radiology nurse under my direct supervision.  CONTRAST:  60 cc [id number]  COMPLICATIONS: SIR Level A - No therapy, no consequence.  Procedure complicated by development of a moderate-sized right-sided perinephric hematoma.  PROCEDURE: Informed consent was obtained from the patient following an explanation of the procedure, risks, benefits and alternatives. A time out was performed prior to the initiation of the procedure.  The patient was positioned prone on the CT table and a limited CT was performed for procedural planning demonstrating the right kidney but poorly visualizing the endophytic hypoenhancing lesion within the superior pole the left kidney.  Ultrasound was utilized to identify the left kidney as well as a approximately 2.0 x 1.7 cm echogenic  lesion which was felt to represent left renal lesion in question. As such, the skin overlying the left flank was prepped and draped in usual sterile fashion.  Under direct ultrasound guidance, a 17 gauge coaxial needle was advanced into the questioned echogenic left renal lesion.  At this time, intravenous contrast was administered and portal venous and delayed phase imaging was obtained.  This point it became clear the initial questioned lesion did not correlate with the hypoenhancing lesion within the superior pole left kidney. As such, under direct ultrasound guidance, the coaxial needle was advanced towards the hypoattenuating lesion. Appropriate positioning was confirmed with CT imaging two 18 gauge core needle biopsies were obtained.  The co-axial needle was removed following the administration of a Gel-Foam slurry and superficial hemostasis was achieved with manual compression.  Postprocedural imaging  demonstrated moderate-sized perinephric hematoma however patient remained hemodynamically stable throughout prolonged observation within the recovery unit.  The patient otherwise tolerated the procedure well without immediate postprocedural complication.  IMPRESSION: Technically successful ultrasound and CT guided biopsy of ill-defined hypoattenuating lesion within the superior pole of the left kidney necessitating the administration of intravenous contrast for lesion location confirmation purposes.  Procedure complicated by development of a moderate-sized perinephric hematoma, however the patient remained hemodynamically stable throughout prolonged observation within the recovery unit.  Given patient's hemodynamic stability as well as desire NOT to be admitted to hospital as we remain in the midst of the [place] 19 pandemic, patient was discharged home however was given explicit instructions to return to the emergency department if she were to experience worsening flank pain and/or lightheadedness.  The patient and the patient's husband demonstrated excellent understanding of this conversation and are in agreement with the proposed plan of care.   Electronically Signed   By: Sandi Mariscal M.D.   On: 11/08/2019 16:39  I have personally reviewed the images and agree with radiologist interpretation.   Assessment & Plan:    1. Renal cell carcinoma Reviewed CT biopsy indicative of renal cell carcinoma  Reviewed surgical path showing tumor measuring 3 cm and renal cell carcinoma grade 2.  Discussed that given the difficult location of the tumor, extremely endophytic and close to the collecting system, the lesion is not amenable to partial nephrectomy.  Additionally, given that the lesion was very difficult with ill-defined borders to biopsy, was also would not be ideal for cryoablation. Recommend minimally invasive laparoscopic hand-assisted left nephrectomy with risks including  bleeding, infection, damage to surrounding structures, ileus, and hernia.  More serious complications including heart attack stroke DVT were also reviewed. Pt interested in minimally invasive laparoscopic left nephrectomy Discussed the after effects with having 1 renal unit solitary kidney precautions and likely anticipated development of CKD. Advised pt to take extra caution to not get dehydrated and to avoid NSAIDs after nephrectomy    [facility] [address] [place], [place] [address] [phone]  I, Lucas Mallow, am acting as a scribe for Dr. Hollice Espy,  I have reviewed the above documentation for accuracy and completeness, and I agree with the above.   Hollice Espy, MD   I spent 50 total minutes on the day of the encounter including pre-visit review of the medical record, face-to-face time with the patient, and post visit ordering of labs/imaging/tests.

## 2019-11-14 NOTE — Progress Notes (Signed)
11/15/19 1:27 PM   Chelsea Hicks 27-Jan-1966 [id number]  Referring provider: Pleas Koch, NP [place],  [place] [address]  Chief Complaint  Patient presents with  . renal mass    biopsy results    HPI: Chelsea Hicks is a 54 y.o. white F with a history of Crohn's disease and renal mass who returns today for post biopsy results. Her husband, [name], is present via phone.   On 10/11/19 she underwent a screening CT scan given her history of Crohn's disease.  This indicated an incidental 3.0 cm left endophytic solid enhancing renal mass concerning for possible renal cell carcinoma.  She returns today for follow-up following renal mass biopsy.  She did receive IV contrast as the lesion itself was very difficult to identify even on CT scan.  Ultimately the procedure was successful.  There was concern for perinephric hematoma.  Surgical pathology report from 11/08/19 shows tumor measuring at least 3 cm in biopsy sample and renal cell carcinoma grade 2. No evidence of sacromatoid or rhabdoid features.   Pt denies use of NSAIDs and drinks plenty of water. She does not do heavy lifting.   She has a previous SHx of a cholecystectomy and bowel resection.  PMH: Past Medical History:  Diagnosis Date  . 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 Crohn's disease ([place]) 2005  . Diffuse cystic mastopathy 2013  . Herpes zoster without complication 04/07/8298  . Hypertrophic lichen planus   . Lichen simplex chronicus   . Regional enteritis of unspecified site   . Special screening for malignant neoplasms, colon     Surgical History: Past Surgical History:  Procedure Laterality Date  . bowel rescetion  07/2009  . BREAST BIOPSY Left 02-11-04   core bx,benign  . CHOLECYSTECTOMY  12/2001  . COLONOSCOPY  2011   Dr. Vira Agar  . DILATION AND CURETTAGE OF UTERUS    . hemoglobin electrophinesis    . LIPOMA EXCISION  2009   from back  . LIPOMA  EXCISION Left 07/18/2012   arm  . TUBAL LIGATION      Home Medications:  Allergies as of 11/15/2019      Reactions   Amoxicillin-pot Clavulanate    REACTION: hives   Penicillins Hives   Sulfonamide Derivatives    REACTION: hives      Medication List       Accurate as of November 15, 2019  1:27 PM. If you have any questions, ask your nurse or doctor.        azathioprine 100 MG tablet Commonly known as: IMURAN Take 150 mg by mouth daily.   Iron 100/C 100-250 MG Tabs Generic drug: Iron-Vitamin C   multivitamin tablet Take 1 tablet by mouth daily.   vitamin B-12 100 MCG tablet Commonly known as: CYANOCOBALAMIN Take 100 mcg by mouth daily.   VITAMIN D PO Take by mouth.       Allergies:  Allergies  Allergen Reactions  . Amoxicillin-Pot Clavulanate     REACTION: hives  . Penicillins Hives  . Sulfonamide Derivatives     REACTION: hives    Family History: Family History  Problem Relation Age of Onset  . Cancer Maternal Grandfather        prostate  . Cancer Paternal Grandmother        breast  . Breast cancer Paternal Grandmother     Social History:  reports that she has never smoked. She has  never used smokeless tobacco. She reports current alcohol use. She reports that she does not use drugs.   Physical Exam: BP (!) 145/81   Pulse (!) 112   Ht 5' 5"  (1.651 m)   Wt 152 lb (68.9 kg)   LMP 11/06/2019 Comment: Tubal Ligation  BMI 25.29 kg/m   Constitutional:  Alert and oriented, No acute distress.  Husband is available by telephone today. HEENT: [place] AT, moist mucus membranes.  Trachea midline, no masses. Cardiovascular: No clubbing, cyanosis, or edema. Respiratory: Normal respiratory effort, no increased work of breathing. Skin: No rashes, bruises or suspicious lesions. Neurologic: Grossly intact, no focal deficits, moving all 4 extremities. Psychiatric: Normal mood and affect.  Laboratory Data: SURGICAL PATHOLOGY  CASE: [id number]  PATIENT:  Chelsea Hicks   Surgical Pathology Report   Specimen Submitted:  A. Kidney, left; bx   Clinical History: No known primary, now with indeterminate left sided  renal lesion, post image guided bx.     DIAGNOSIS:  A. KIDNEY, LEFT; CT-GUIDED CORE NEEDLE BIOPSY:  - RENAL CELL CARCINOMA, CONVENTIONAL CLEAR CELL TYPE, GRADE 2  (WHO/ISUP).   Comment:  The tumor measures at least 8.5 mm in this core needle biopsy sample.  There is no evidence of sarcomatoid or rhabdoid features.   There is sufficient tissue for ancillary testing if desired.   The findings were communicated to Dr. Erlene Quan at 12:45 p.m. on 11/09/2019  via [facility] secure chat.   GROSS DESCRIPTION:  A. Labeled: Labeled with the patient's name and date of birth (per  requisition, left sided renal lesion)  Received: Formalin  Tissue fragment(s): 3  Size: Ranging from 0.8 to 1.1 cm in length by 0.1 cm in diameter  Description: Received are needle core biopsy fragments of yellow-red  soft tissue. The specimen is wrapped in lens paper and placed into mesh  bags.  Entirely submitted in cassettes 1-3.     Final Diagnosis performed by Allena Napoleon, MD.  Electronically signed  11/09/2019 1:00:49PM  The electronic signature indicates that the named Attending Pathologist  has evaluated the specimen  Technical component performed at [facility], [address] [place],  [place] [address] Lab: [phone] Dir: Rush Farmer, MD, MMM  Professional component performed at [facility], [facility], [facility], [place], [place] [address] Lab: [phone]  Dir: Dellia Nims. Reuel Derby, MD   I have reviewed surgical pathology report.   Pertinent Imaging: INDICATION: No known primary, now with indeterminate right-sided renal lesion. Please perform image guided biopsy for tissue diagnostic purposes.  EXAM: ULTRASOUND AND CT-GUIDED RIGHT RENAL LESION BIOPSY  COMPARISON:  CT abdomen and  pelvis-10/11/2019  MEDICATIONS: None.  ANESTHESIA/SEDATION: Fentanyl 150 mcg IV; Versed 2 mg IV  Sedation time: 30 minutes; The patient was continuously monitored during the procedure by the interventional radiology nurse under my direct supervision.  CONTRAST:  60 cc [id number]  COMPLICATIONS: SIR Level A - No therapy, no consequence.  Procedure complicated by development of a moderate-sized right-sided perinephric hematoma.  PROCEDURE: Informed consent was obtained from the patient following an explanation of the procedure, risks, benefits and alternatives. A time out was performed prior to the initiation of the procedure.  The patient was positioned prone on the CT table and a limited CT was performed for procedural planning demonstrating the right kidney but poorly visualizing the endophytic hypoenhancing lesion within the superior pole the left kidney.  Ultrasound was utilized to identify the left kidney as well as a approximately 2.0 x 1.7 cm echogenic  lesion which was felt to represent left renal lesion in question. As such, the skin overlying the left flank was prepped and draped in usual sterile fashion.  Under direct ultrasound guidance, a 17 gauge coaxial needle was advanced into the questioned echogenic left renal lesion.  At this time, intravenous contrast was administered and portal venous and delayed phase imaging was obtained.  This point it became clear the initial questioned lesion did not correlate with the hypoenhancing lesion within the superior pole left kidney. As such, under direct ultrasound guidance, the coaxial needle was advanced towards the hypoattenuating lesion. Appropriate positioning was confirmed with CT imaging two 18 gauge core needle biopsies were obtained.  The co-axial needle was removed following the administration of a Gel-Foam slurry and superficial hemostasis was achieved with  manual compression.  Postprocedural imaging demonstrated moderate-sized perinephric hematoma however patient remained hemodynamically stable throughout prolonged observation within the recovery unit.  The patient otherwise tolerated the procedure well without immediate postprocedural complication.  IMPRESSION: Technically successful ultrasound and CT guided biopsy of ill-defined hypoattenuating lesion within the superior pole of the left kidney necessitating the administration of intravenous contrast for lesion location confirmation purposes.  Procedure complicated by development of a moderate-sized perinephric hematoma, however the patient remained hemodynamically stable throughout prolonged observation within the recovery unit.  Given patient's hemodynamic stability as well as desire NOT to be admitted to hospital as we remain in the midst of the [place] 19 pandemic, patient was discharged home however was given explicit instructions to return to the emergency department if she were to experience worsening flank pain and/or lightheadedness.  The patient and the patient's husband demonstrated excellent understanding of this conversation and are in agreement with the proposed plan of care.   Electronically Signed   By: Sandi Mariscal M.D.   On: 11/08/2019 16:39  I have personally reviewed the images and agree with radiologist interpretation.   Assessment & Plan:    1. Renal cell carcinoma Reviewed CT biopsy indicative of renal cell carcinoma  Reviewed surgical path showing tumor measuring 3 cm and renal cell carcinoma grade 2.  Discussed that given the difficult location of the tumor, extremely endophytic and close to the collecting system, the lesion is not amenable to partial nephrectomy.  Additionally, given that the lesion was very difficult with ill-defined borders to biopsy, was also would not be ideal for cryoablation. Recommend minimally invasive laparoscopic  hand-assisted left nephrectomy with risks including bleeding, infection, damage to surrounding structures, ileus, and hernia.  More serious complications including heart attack stroke DVT were also reviewed. Pt interested in minimally invasive laparoscopic left nephrectomy Discussed the after effects with having 1 renal unit solitary kidney precautions and likely anticipated development of CKD. Advised pt to take extra caution to not get dehydrated and to avoid NSAIDs after nephrectomy    [facility] [address], [place], [place] [address] [phone]  I, Lucas Mallow, am acting as a scribe for Dr. Hollice Espy,  I have reviewed the above documentation for accuracy and completeness, and I agree with the above.   Hollice Espy, MD   I spent 50 total minutes on the day of the encounter including pre-visit review of the medical record, face-to-face time with the patient, and post visit ordering of labs/imaging/tests.

## 2019-11-15 ENCOUNTER — Ambulatory Visit: Payer: BC Managed Care – PPO | Admitting: Urology

## 2019-11-15 ENCOUNTER — Other Ambulatory Visit: Payer: Self-pay | Admitting: Radiology

## 2019-11-15 ENCOUNTER — Other Ambulatory Visit: Payer: Self-pay

## 2019-11-15 VITALS — BP 145/81 | HR 112 | Ht 65.0 in | Wt 152.0 lb

## 2019-11-15 DIAGNOSIS — C642 Malignant neoplasm of left kidney, except renal pelvis: Secondary | ICD-10-CM

## 2019-11-16 ENCOUNTER — Inpatient Hospital Stay: Payer: BC Managed Care – PPO | Attending: Anatomic Pathology & Clinical Pathology

## 2019-11-16 NOTE — Progress Notes (Signed)
Tumor Board Documentation  Chelsea Hicks was presented by Radiology and Pathology at our Tumor Board on 11/16/2019, which included representatives from medical oncology, radiation oncology, navigation, pathology, radiology, surgical, surgical oncology, research, palliative care.  Chelsea Hicks currently presents as an external consult, for [name], for new positive pathology with history of the following treatments: active survellience, surgical intervention(s).  Additionally, we reviewed previous medical and familial history, history of present illness, and recent lab results along with all available histopathologic and imaging studies. The tumor board considered available treatment options and made the following recommendations:   To be determined  The following procedures/referrals were also placed: No orders of the defined types were placed in this encounter.   Clinical Trial Status: not discussed   Staging used: Pathologic Stage  AJCC Staging: T: 1a N: 0 M: 0 Group: Stage 1 Clear Cell Renal cancer  National site-specific guidelines   were discussed with respect to the case.  Tumor board is a meeting of clinicians from various specialty areas who evaluate and discuss patients for whom a multidisciplinary approach is being considered. Final determinations in the plan of care are those of the provider(s). The responsibility for follow up of recommendations given during tumor board is that of the provider.   Today's extended care, comprehensive team conference, Chelsea Hicks was not present for the discussion and was not examined.   Multidisciplinary Tumor Board is a multidisciplinary case peer review process.  Decisions discussed in the Multidisciplinary Tumor Board reflect the opinions of the specialists present at the conference without having examined the patient.  Ultimately, treatment and diagnostic decisions rest with the primary provider(s) and the patient.

## 2019-11-21 NOTE — Telephone Encounter (Signed)
error 

## 2019-11-22 ENCOUNTER — Other Ambulatory Visit: Payer: Self-pay

## 2019-11-22 DIAGNOSIS — N2889 Other specified disorders of kidney and ureter: Secondary | ICD-10-CM

## 2019-11-23 ENCOUNTER — Other Ambulatory Visit: Payer: Self-pay

## 2019-11-23 ENCOUNTER — Other Ambulatory Visit: Payer: BC Managed Care – PPO

## 2019-11-23 ENCOUNTER — Encounter
Admission: RE | Admit: 2019-11-23 | Discharge: 2019-11-23 | Disposition: A | Discharge status: Home / Self Care | Payer: BC Managed Care – PPO | Source: Ambulatory Visit | Attending: Urology | Admitting: Urology

## 2019-11-23 DIAGNOSIS — N2889 Other specified disorders of kidney and ureter: Secondary | ICD-10-CM

## 2019-11-23 DIAGNOSIS — Z01812 Encounter for preprocedural laboratory examination: Secondary | ICD-10-CM | POA: Diagnosis not present

## 2019-11-23 HISTORY — DX: Other specified postprocedural states: R11.2

## 2019-11-23 HISTORY — DX: Other specified postprocedural states: Z98.890

## 2019-11-23 LAB — URINALYSIS, COMPLETE
Bilirubin, UA: NEGATIVE
Glucose, UA: NEGATIVE
Ketones, UA: NEGATIVE
Leukocytes,UA: NEGATIVE
Nitrite, UA: NEGATIVE
Protein,UA: NEGATIVE
Specific Gravity, UA: 1.015 (ref 1.005–1.030)
Urobilinogen, Ur: 0.2 mg/dL (ref 0.2–1.0)
pH, UA: 6 (ref 5.0–7.5)

## 2019-11-23 LAB — MICROSCOPIC EXAMINATION

## 2019-11-23 NOTE — Patient Instructions (Signed)
Your procedure is scheduled on: Thursday November 30, 2019 Report to Day Surgery. To find out your arrival time please call [phone] between 1PM - 3PM on Wednesday November 29, 2019.  Remember: Instructions that are not followed completely may result in serious medical risk,  up to and including death, or upon the discretion of your surgeon and anesthesiologist your  surgery may need to be rescheduled.     _X__ 1. Do not eat food after midnight the night before your procedure.                 No gum chewing or hard candies. You may drink clear liquids up to 2 hours                 before you are scheduled to arrive for your surgery- DO not drink clear                 liquids within 2 hours of the start of your surgery.                 Clear Liquids include:  water, apple juice without pulp, clear Gatorade, G2 or                  Gatorade Zero (avoid Red/Purple/Blue), Black Coffee or Tea (Do not add                 anything to coffee or tea).  __X__2.  On the morning of surgery brush your teeth with toothpaste and water, you                may rinse your mouth with mouthwash if you wish.  Do not swallow any toothpaste of mouthwash.     _X__ 3.  No Alcohol for 24 hours before or after surgery.   _X__ 4.  Do Not Smoke or use e-cigarettes For 24 Hours Prior to Your Surgery.                 Do not use any chewable tobacco products for at least 6 hours prior to                 surgery.  __x__  6.  Notify your doctor if there is any change in your medical condition      (cold, fever, infections).     Do not wear jewelry, make-up, hairpins, clips or nail polish. Do not wear lotions, powders, or perfumes. You may wear deodorant. Do not shave 48 hours prior to surgery. Men may shave face and neck. Do not bring valuables to the hospital.    [facility] is not responsible for any belongings or valuables.  Contacts, dentures or bridgework may not be worn into  surgery. Leave your suitcase in the car. After surgery it may be brought to your room. For patients admitted to the hospital, discharge time is determined by your treatment team.   Patients discharged the day of surgery will not be allowed to drive home.   Make arrangements for someone to be with you for the first 24 hours of your Same Day Discharge.   ____ Take these medicines the morning of surgery with A SIP OF WATER:   None   __x__ Use CHG Soap (or wipes) as directed  __x__ Stop Anti-inflammatories such as aspirin, ibuprofen, Aleve, naproxen and BC powders.   __x__ Stop supplements until after surgery.    __x__ Do not start any herbal supplements before your  surgery.

## 2019-11-26 LAB — CULTURE, URINE COMPREHENSIVE

## 2019-11-28 ENCOUNTER — Other Ambulatory Visit
Admission: RE | Admit: 2019-11-28 | Discharge: 2019-11-28 | Disposition: A | Discharge status: Home / Self Care | Payer: BC Managed Care – PPO | Source: Ambulatory Visit | Attending: Urology | Admitting: Urology

## 2019-11-28 ENCOUNTER — Other Ambulatory Visit: Payer: Self-pay

## 2019-11-28 DIAGNOSIS — Z01812 Encounter for preprocedural laboratory examination: Secondary | ICD-10-CM | POA: Insufficient documentation

## 2019-11-28 DIAGNOSIS — Z20822 Contact with and (suspected) exposure to covid-19: Secondary | ICD-10-CM | POA: Insufficient documentation

## 2019-11-28 LAB — CBC
HCT: 40.3 % (ref 36.0–46.0)
Hemoglobin: 13.5 g/dL (ref 12.0–15.0)
MCH: 30.8 pg (ref 26.0–34.0)
MCHC: 33.5 g/dL (ref 30.0–36.0)
MCV: 91.8 fL (ref 80.0–100.0)
Platelets: 376 10*3/uL (ref 150–400)
RBC: 4.39 MIL/uL (ref 3.87–5.11)
RDW: 13.2 % (ref 11.5–15.5)
WBC: 5.2 10*3/uL (ref 4.0–10.5)
nRBC: 0 % (ref 0.0–0.2)

## 2019-11-28 LAB — PROTIME-INR
INR: 1 (ref 0.8–1.2)
Prothrombin Time: 13.1 seconds (ref 11.4–15.2)

## 2019-11-28 LAB — BASIC METABOLIC PANEL
Anion gap: 7 (ref 5–15)
BUN: 8 mg/dL (ref 6–20)
CO2: 27 mmol/L (ref 22–32)
Calcium: 8.8 mg/dL — ABNORMAL LOW (ref 8.9–10.3)
Chloride: 106 mmol/L (ref 98–111)
Creatinine, Ser: 0.63 mg/dL (ref 0.44–1.00)
GFR calc Af Amer: >60 mL/min (ref >60)
GFR calc non Af Amer: >60 mL/min (ref >60)
Glucose, Bld: 92 mg/dL (ref 70–99)
Potassium: 4.1 mmol/L (ref 3.5–5.1)
Sodium: 140 mmol/L (ref 135–145)

## 2019-11-28 LAB — TYPE AND SCREEN
ABO/RH(D): A POS
Antibody Screen: NEGATIVE

## 2019-11-28 LAB — SARS CORONAVIRUS 2 (TAT 6-24 HRS): SARS Coronavirus 2: NEGATIVE

## 2019-11-28 MED ORDER — PROPOFOL 500 MG/50ML IV EMUL
INTRAVENOUS | Status: AC
  Filled 2019-11-28: qty 50

## 2019-11-28 MED ORDER — PROPOFOL 500 MG/50ML IV EMUL
INTRAVENOUS | Status: AC
  Filled 2019-11-28: qty 150

## 2019-11-28 MED ORDER — LIDOCAINE HCL (PF) 2 % IJ SOLN
INTRAMUSCULAR | Status: AC
  Filled 2019-11-28: qty 5

## 2019-11-28 MED ORDER — GLYCOPYRROLATE 0.2 MG/ML IJ SOLN
INTRAMUSCULAR | Status: AC
  Filled 2019-11-28: qty 1

## 2019-11-30 ENCOUNTER — Inpatient Hospital Stay: Payer: BC Managed Care – PPO | Admitting: Certified Registered"

## 2019-11-30 ENCOUNTER — Encounter
Admission: RE | Disposition: A | Discharge status: Home / Self Care | Payer: Self-pay | Source: Home / Self Care | Attending: Urology

## 2019-11-30 ENCOUNTER — Other Ambulatory Visit: Payer: Self-pay

## 2019-11-30 ENCOUNTER — Inpatient Hospital Stay
Admission: RE | Admit: 2019-11-30 | Discharge: 2019-12-01 | DRG: 657 | Disposition: A | Discharge status: Home / Self Care | Payer: BC Managed Care – PPO | Attending: Urology | Admitting: Urology

## 2019-11-30 ENCOUNTER — Encounter: Payer: Self-pay | Admitting: Urology

## 2019-11-30 DIAGNOSIS — D4102 Neoplasm of uncertain behavior of left kidney: Secondary | ICD-10-CM

## 2019-11-30 DIAGNOSIS — K5090 Crohn's disease, unspecified, without complications: Secondary | ICD-10-CM | POA: Diagnosis present

## 2019-11-30 DIAGNOSIS — Z20822 Contact with and (suspected) exposure to covid-19: Secondary | ICD-10-CM | POA: Diagnosis present

## 2019-11-30 DIAGNOSIS — K660 Peritoneal adhesions (postprocedural) (postinfection): Secondary | ICD-10-CM | POA: Diagnosis present

## 2019-11-30 DIAGNOSIS — Z803 Family history of malignant neoplasm of breast: Secondary | ICD-10-CM | POA: Diagnosis not present

## 2019-11-30 DIAGNOSIS — C642 Malignant neoplasm of left kidney, except renal pelvis: Principal | ICD-10-CM

## 2019-11-30 DIAGNOSIS — Z85528 Personal history of other malignant neoplasm of kidney: Secondary | ICD-10-CM | POA: Diagnosis present

## 2019-11-30 HISTORY — DX: Malignant neoplasm of left kidney, except renal pelvis: C64.2

## 2019-11-30 HISTORY — PX: LAPAROSCOPIC NEPHRECTOMY, HAND ASSISTED: SHX1929

## 2019-11-30 LAB — ABO/RH: ABO/RH(D): A POS

## 2019-11-30 LAB — POCT PREGNANCY, URINE: Preg Test, Ur: NEGATIVE

## 2019-11-30 SURGERY — NEPHRECTOMY, HAND-ASSISTED, LAPAROSCOPIC
Anesthesia: General | Laterality: Left

## 2019-11-30 MED ORDER — KETAMINE HCL 10 MG/ML IJ SOLN
INTRAMUSCULAR | Status: DC | PRN
  Administered 2019-11-30: 10 mg via INTRAVENOUS

## 2019-11-30 MED ORDER — ACETAMINOPHEN 325 MG PO TABS: 650.0000 mg | ORAL_TABLET | ORAL | Status: DC | PRN

## 2019-11-30 MED ORDER — HYDROMORPHONE HCL 1 MG/ML IJ SOLN
INTRAMUSCULAR | Status: AC
  Administered 2019-11-30: 14:00:00 0.5 mg via INTRAVENOUS
  Filled 2019-11-30: qty 1

## 2019-11-30 MED ORDER — PHENYLEPHRINE HCL (PRESSORS) 10 MG/ML IV SOLN
INTRAVENOUS | Status: DC | PRN
  Administered 2019-11-30 (×2): 100 ug via INTRAVENOUS

## 2019-11-30 MED ORDER — OXYBUTYNIN CHLORIDE 5 MG PO TABS: 5.0000 mg | ORAL_TABLET | Freq: Three times a day (TID) | ORAL | Status: DC | PRN

## 2019-11-30 MED ORDER — LACTATED RINGERS IV SOLN: INTRAVENOUS | Status: DC | PRN

## 2019-11-30 MED ORDER — DIPHENHYDRAMINE HCL 50 MG/ML IJ SOLN
INTRAMUSCULAR | Status: AC
  Administered 2019-11-30: 15:00:00 12.5 mg via INTRAVENOUS
  Filled 2019-11-30: qty 1

## 2019-11-30 MED ORDER — DIPHENHYDRAMINE HCL 50 MG/ML IJ SOLN
12.5000 mg | INTRAMUSCULAR | Status: AC | PRN
  Administered 2019-11-30: 15:00:00 12.5 mg via INTRAVENOUS

## 2019-11-30 MED ORDER — FENTANYL CITRATE (PF) 100 MCG/2ML IJ SOLN
25.0000 ug | INTRAMUSCULAR | Status: DC | PRN
  Administered 2019-11-30: 14:00:00 50 ug via INTRAVENOUS

## 2019-11-30 MED ORDER — BUPIVACAINE LIPOSOME 1.3 % IJ SUSP
INTRAMUSCULAR | Status: DC | PRN
  Administered 2019-11-30: 50 mL

## 2019-11-30 MED ORDER — DIPHENHYDRAMINE HCL 12.5 MG/5ML PO ELIX
12.5000 mg | ORAL_SOLUTION | Freq: Four times a day (QID) | ORAL | Status: DC | PRN
  Filled 2019-11-30: qty 5

## 2019-11-30 MED ORDER — GLYCOPYRROLATE 0.2 MG/ML IJ SOLN
INTRAMUSCULAR | Status: DC | PRN
  Administered 2019-11-30: .2 mg via INTRAVENOUS

## 2019-11-30 MED ORDER — ONDANSETRON HCL 4 MG/2ML IJ SOLN
4.0000 mg | INTRAMUSCULAR | Status: DC | PRN
  Administered 2019-11-30 – 2019-12-01 (×2): 4 mg via INTRAVENOUS
  Filled 2019-11-30 (×2): qty 2

## 2019-11-30 MED ORDER — FERROUS SULFATE 325 (65 FE) MG PO TABS
325.0000 mg | ORAL_TABLET | Freq: Every day | ORAL | Status: DC
  Administered 2019-12-01: 09:00:00 325 mg via ORAL
  Filled 2019-11-30: qty 1

## 2019-11-30 MED ORDER — BUPIVACAINE HCL (PF) 0.5 % IJ SOLN
INTRAMUSCULAR | Status: AC
  Filled 2019-11-30: qty 30

## 2019-11-30 MED ORDER — SUGAMMADEX SODIUM 200 MG/2ML IV SOLN
INTRAVENOUS | Status: DC | PRN
  Administered 2019-11-30: 200 mg via INTRAVENOUS

## 2019-11-30 MED ORDER — HYDROCODONE-ACETAMINOPHEN 7.5-325 MG PO TABS
1.0000 | ORAL_TABLET | Freq: Once | ORAL | Status: DC | PRN
  Filled 2019-11-30: qty 1

## 2019-11-30 MED ORDER — MORPHINE SULFATE (PF) 4 MG/ML IV SOLN
2.0000 mg | INTRAVENOUS | Status: DC | PRN
  Administered 2019-11-30: 17:00:00 2 mg via INTRAVENOUS
  Administered 2019-11-30 – 2019-12-01 (×3): 4 mg via INTRAVENOUS
  Filled 2019-11-30 (×4): qty 1

## 2019-11-30 MED ORDER — THROMBIN 5000 UNITS EX SOLR
CUTANEOUS | Status: AC
  Filled 2019-11-30: qty 5000

## 2019-11-30 MED ORDER — MEPERIDINE HCL 50 MG/ML IJ SOLN: 6.2500 mg | INTRAMUSCULAR | Status: DC | PRN

## 2019-11-30 MED ORDER — FENTANYL CITRATE (PF) 100 MCG/2ML IJ SOLN
INTRAMUSCULAR | Status: AC
  Administered 2019-11-30: 50 ug via INTRAVENOUS
  Filled 2019-11-30: qty 2

## 2019-11-30 MED ORDER — ROCURONIUM BROMIDE 100 MG/10ML IV SOLN
INTRAVENOUS | Status: DC | PRN
  Administered 2019-11-30: 10 mg via INTRAVENOUS
  Administered 2019-11-30: 20 mg via INTRAVENOUS
  Administered 2019-11-30: 50 mg via INTRAVENOUS

## 2019-11-30 MED ORDER — ACETAMINOPHEN 325 MG PO TABS: 325.0000 mg | ORAL_TABLET | ORAL | Status: DC | PRN

## 2019-11-30 MED ORDER — CHLORHEXIDINE GLUCONATE CLOTH 2 % EX PADS: 6.0000 | MEDICATED_PAD | Freq: Every day | CUTANEOUS | Status: DC

## 2019-11-30 MED ORDER — THROMBIN 5000 UNITS EX SOLR
CUTANEOUS | Status: DC | PRN
  Administered 2019-11-30: 5000 [IU] via TOPICAL

## 2019-11-30 MED ORDER — BELLADONNA ALKALOIDS-OPIUM 16.2-60 MG RE SUPP
1.0000 | Freq: Four times a day (QID) | RECTAL | Status: DC | PRN
  Filled 2019-11-30: qty 1

## 2019-11-30 MED ORDER — FAMOTIDINE 20 MG PO TABS
ORAL_TABLET | ORAL | Status: AC
  Administered 2019-11-30: 20 mg via ORAL
  Filled 2019-11-30: qty 1

## 2019-11-30 MED ORDER — FAMOTIDINE 20 MG PO TABS: 20.0000 mg | ORAL_TABLET | Freq: Once | ORAL | Status: AC

## 2019-11-30 MED ORDER — PROMETHAZINE HCL 25 MG/ML IJ SOLN: 6.2500 mg | INTRAMUSCULAR | Status: DC | PRN

## 2019-11-30 MED ORDER — SODIUM CHLORIDE (PF) 0.9 % IJ SOLN
INTRAMUSCULAR | Status: AC
  Filled 2019-11-30: qty 50

## 2019-11-30 MED ORDER — FENTANYL CITRATE (PF) 100 MCG/2ML IJ SOLN
INTRAMUSCULAR | Status: AC
  Filled 2019-11-30: qty 2

## 2019-11-30 MED ORDER — EPHEDRINE SULFATE 50 MG/ML IJ SOLN
INTRAMUSCULAR | Status: DC | PRN
  Administered 2019-11-30: 5 mg via INTRAVENOUS

## 2019-11-30 MED ORDER — LIDOCAINE HCL (CARDIAC) PF 100 MG/5ML IV SOSY
PREFILLED_SYRINGE | INTRAVENOUS | Status: DC | PRN
  Administered 2019-11-30: 100 mg via INTRAVENOUS

## 2019-11-30 MED ORDER — HEPARIN SODIUM (PORCINE) 5000 UNIT/ML IJ SOLN
5000.0000 [IU] | Freq: Three times a day (TID) | INTRAMUSCULAR | Status: DC
  Administered 2019-11-30 – 2019-12-01 (×3): 5000 [IU] via SUBCUTANEOUS
  Filled 2019-11-30 (×3): qty 1

## 2019-11-30 MED ORDER — SODIUM CHLORIDE 0.9 % IV SOLN
INTRAVENOUS | Status: DC | PRN
  Administered 2019-11-30: 10 ug/min via INTRAVENOUS

## 2019-11-30 MED ORDER — LIDOCAINE HCL (PF) 2 % IJ SOLN
INTRAMUSCULAR | Status: AC
  Filled 2019-11-30: qty 5

## 2019-11-30 MED ORDER — HYDROMORPHONE HCL 1 MG/ML IJ SOLN
0.5000 mg | INTRAMUSCULAR | Status: AC | PRN
  Administered 2019-11-30 (×2): 0.5 mg via INTRAVENOUS

## 2019-11-30 MED ORDER — AZATHIOPRINE 50 MG PO TABS
150.0000 mg | ORAL_TABLET | Freq: Every day | ORAL | Status: DC
  Administered 2019-12-01: 150 mg via ORAL
  Filled 2019-11-30 (×2): qty 3

## 2019-11-30 MED ORDER — DOCUSATE SODIUM 100 MG PO CAPS
100.0000 mg | ORAL_CAPSULE | Freq: Two times a day (BID) | ORAL | Status: DC
  Administered 2019-11-30 – 2019-12-01 (×3): 100 mg via ORAL
  Filled 2019-11-30 (×3): qty 1

## 2019-11-30 MED ORDER — SODIUM CHLORIDE 0.9 % IV SOLN: INTRAVENOUS | Status: DC

## 2019-11-30 MED ORDER — OXYCODONE-ACETAMINOPHEN 5-325 MG PO TABS
1.0000 | ORAL_TABLET | ORAL | Status: DC | PRN
  Administered 2019-12-01: 10:00:00 1 via ORAL
  Filled 2019-11-30: qty 2

## 2019-11-30 MED ORDER — DEXAMETHASONE SODIUM PHOSPHATE 10 MG/ML IJ SOLN
INTRAMUSCULAR | Status: DC | PRN
  Administered 2019-11-30: 10 mg via INTRAVENOUS

## 2019-11-30 MED ORDER — ROCURONIUM BROMIDE 10 MG/ML (PF) SYRINGE
PREFILLED_SYRINGE | INTRAVENOUS | Status: AC
  Filled 2019-11-30: qty 10

## 2019-11-30 MED ORDER — MIDAZOLAM HCL 2 MG/2ML IJ SOLN
INTRAMUSCULAR | Status: AC
  Filled 2019-11-30: qty 2

## 2019-11-30 MED ORDER — PROPOFOL 10 MG/ML IV BOLUS
INTRAVENOUS | Status: AC
  Filled 2019-11-30: qty 20

## 2019-11-30 MED ORDER — FENTANYL CITRATE (PF) 100 MCG/2ML IJ SOLN
INTRAMUSCULAR | Status: DC | PRN
  Administered 2019-11-30 (×5): 50 ug via INTRAVENOUS

## 2019-11-30 MED ORDER — DIPHENHYDRAMINE HCL 50 MG/ML IJ SOLN: 12.5000 mg | Freq: Four times a day (QID) | INTRAMUSCULAR | Status: DC | PRN

## 2019-11-30 MED ORDER — BUPIVACAINE LIPOSOME 1.3 % IJ SUSP
INTRAMUSCULAR | Status: AC
  Filled 2019-11-30: qty 20

## 2019-11-30 MED ORDER — CLINDAMYCIN PHOSPHATE 900 MG/50ML IV SOLN
INTRAVENOUS | Status: AC
  Filled 2019-11-30: qty 50

## 2019-11-30 MED ORDER — HYDROMORPHONE HCL 1 MG/ML IJ SOLN
INTRAMUSCULAR | Status: AC
  Administered 2019-11-30: 0.5 mg via INTRAVENOUS
  Filled 2019-11-30: qty 1

## 2019-11-30 MED ORDER — KETAMINE HCL 50 MG/ML IJ SOLN
INTRAMUSCULAR | Status: AC
  Filled 2019-11-30: qty 10

## 2019-11-30 MED ORDER — ACETAMINOPHEN 160 MG/5ML PO SUSP
325.0000 mg | ORAL | Status: DC | PRN
  Filled 2019-11-30: qty 20.3

## 2019-11-30 MED ORDER — CLINDAMYCIN PHOSPHATE 900 MG/50ML IV SOLN
900.0000 mg | INTRAVENOUS | Status: AC
  Administered 2019-11-30: 900 mg via INTRAVENOUS

## 2019-11-30 MED ORDER — MIDAZOLAM HCL 2 MG/2ML IJ SOLN
INTRAMUSCULAR | Status: DC | PRN
  Administered 2019-11-30: 2 mg via INTRAVENOUS

## 2019-11-30 MED ORDER — PROPOFOL 10 MG/ML IV BOLUS
INTRAVENOUS | Status: DC | PRN
  Administered 2019-11-30: 140 mg via INTRAVENOUS

## 2019-11-30 MED ORDER — ONDANSETRON HCL 4 MG/2ML IJ SOLN
INTRAMUSCULAR | Status: DC | PRN
  Administered 2019-11-30: 4 mg via INTRAVENOUS

## 2019-11-30 SURGICAL SUPPLY — 88 items
ANCHOR TIS RET SYS 1550ML (BAG) ×2 IMPLANT
APPLICATOR SURGIFLO ENDO (HEMOSTASIS) IMPLANT
APPLIER CLIP ROT 10 11.4 M/L (STAPLE)
APPLIER CLIP ROT 13.4 12 LRG (CLIP)
CANISTER SUCT 1200ML W/VALVE (MISCELLANEOUS) ×9 IMPLANT
CHLORAPREP W/TINT 26 (MISCELLANEOUS) ×3 IMPLANT
CLEANER CAUTERY TIP 5X5 PAD (MISCELLANEOUS) ×1 IMPLANT
CLIP APPLIE ROT 10 11.4 M/L (STAPLE) IMPLANT
CLIP APPLIE ROT 13.4 12 LRG (CLIP) IMPLANT
CLIP VESOLOCK LG 6/CT PURPLE (CLIP) ×3 IMPLANT
CLOSURE WOUND 1/2 X4 (GAUZE/BANDAGES/DRESSINGS)
COVER WAND RF STERILE (DRAPES) ×3 IMPLANT
CUTTER ECHEON FLEX ENDO 45 340 (ENDOMECHANICALS) IMPLANT
DEFOGGER SCOPE WARMER CLEARIFY (MISCELLANEOUS) ×3 IMPLANT
DERMABOND ADVANCED (GAUZE/BANDAGES/DRESSINGS) ×2
DERMABOND ADVANCED .7 DNX12 (GAUZE/BANDAGES/DRESSINGS) ×1 IMPLANT
DISSECTOR KITTNER STICK (MISCELLANEOUS) ×1 IMPLANT
DISSECTORS/KITTNER STICK (MISCELLANEOUS)
DRAPE INCISE IOBAN 66X45 STRL (DRAPES) ×1 IMPLANT
DRAPE STERI POUCH LG 24X46 STR (DRAPES) ×3 IMPLANT
DRAPE SURG 17X11 SM STRL (DRAPES) ×12 IMPLANT
DRSG TEGADERM 2-3/8X2-3/4 SM (GAUZE/BANDAGES/DRESSINGS) IMPLANT
DRSG TEGADERM 4X4.75 (GAUZE/BANDAGES/DRESSINGS) IMPLANT
DRSG TELFA 3X8 NADH (GAUZE/BANDAGES/DRESSINGS) IMPLANT
ELECT REM PT RETURN 9FT ADLT (ELECTROSURGICAL) ×3
ELECTRODE REM PT RTRN 9FT ADLT (ELECTROSURGICAL) ×1 IMPLANT
GLOVE BIO SURGEON STRL SZ 6.5 (GLOVE) ×4 IMPLANT
GLOVE BIO SURGEONS STRL SZ 6.5 (GLOVE) ×2
GLOVE INDICATOR 6.5 STRL GRN (GLOVE) ×9 IMPLANT
GOWN STRL REUS W/ TWL LRG LVL3 (GOWN DISPOSABLE) ×3 IMPLANT
GOWN STRL REUS W/TWL LRG LVL3 (GOWN DISPOSABLE) ×9
GRASPER SUT TROCAR 14GX15 (MISCELLANEOUS) ×3 IMPLANT
HANDLE YANKAUER SUCT BULB TIP (MISCELLANEOUS) ×3 IMPLANT
HEMOSTAT SURGICEL 2X14 (HEMOSTASIS) IMPLANT
HOLDER FOLEY CATH W/STRAP (MISCELLANEOUS) ×3 IMPLANT
IRRIGATION STRYKERFLOW (MISCELLANEOUS) ×1 IMPLANT
IRRIGATOR STRYKERFLOW (MISCELLANEOUS) ×3
IV NS 1000ML (IV SOLUTION) ×3
IV NS 1000ML BAXH (IV SOLUTION) ×1 IMPLANT
KIT PINK PAD W/HEAD ARE REST (MISCELLANEOUS) ×3
KIT PINK PAD W/HEAD ARM REST (MISCELLANEOUS) ×1 IMPLANT
KIT TURNOVER KIT A (KITS) ×3 IMPLANT
L-HOOK LAP DISP 36CM (ELECTROSURGICAL) ×3
LABEL OR SOLS (LABEL) ×3 IMPLANT
LHOOK LAP DISP 36CM (ELECTROSURGICAL) ×1 IMPLANT
LIGASURE LAP ATLAS 10MM 37CM (INSTRUMENTS) ×3 IMPLANT
LOOP RED MAXI  1X406MM (MISCELLANEOUS) ×2
LOOP VESSEL MAXI  1X406 RED (MISCELLANEOUS) ×1
LOOP VESSEL MAXI 1X406 RED (MISCELLANEOUS) ×1 IMPLANT
NDL HYPO 21X1.5 SAFETY (NEEDLE) ×1 IMPLANT
NDL INSUFFLATION 14GA 120MM (NEEDLE) ×1 IMPLANT
NEEDLE HYPO 21X1.5 SAFETY (NEEDLE) ×3 IMPLANT
NEEDLE INSUFFLATION 14GA 120MM (NEEDLE) ×3 IMPLANT
PACK LAP CHOLECYSTECTOMY (MISCELLANEOUS) ×3 IMPLANT
PAD CLEANER CAUTERY TIP 5X5 (MISCELLANEOUS) ×2
PAD DRESSING TELFA 3X8 NADH (GAUZE/BANDAGES/DRESSINGS) IMPLANT
PENCIL ELECTRO HAND CTR (MISCELLANEOUS) ×3 IMPLANT
RELOAD STAPLE 35X2.5 WHT THIN (STAPLE) IMPLANT
RELOAD STAPLE 60 2.6 WHT THN (STAPLE) IMPLANT
RELOAD STAPLER WHITE 60MM (STAPLE) ×1 IMPLANT
RELOAD WH ECHELON 45 (STAPLE) IMPLANT
SCISSORS METZENBAUM CVD 33 (INSTRUMENTS) ×3 IMPLANT
SET TUBE SMOKE EVAC HIGH FLOW (TUBING) ×3 IMPLANT
SPOGE SURGIFLO 8M (HEMOSTASIS)
SPONGE LAP 18X18 RF (DISPOSABLE) ×3 IMPLANT
SPONGE SURGIFLO 8M (HEMOSTASIS) IMPLANT
STAPLE ECHEON FLEX 60 POW ENDO (STAPLE) ×2 IMPLANT
STAPLE RELOAD 2.5MM WHITE (STAPLE) IMPLANT
STAPLER RELOAD WHITE 60MM (STAPLE) ×3
STAPLER SKIN PROX 35W (STAPLE) ×3 IMPLANT
STAPLER VASCULAR ECHELON 35 (CUTTER) IMPLANT
STRIP CLOSURE SKIN 1/2X4 (GAUZE/BANDAGES/DRESSINGS) IMPLANT
SUT CHROMIC 0 CT 1 (SUTURE) IMPLANT
SUT MNCRL AB 4-0 PS2 18 (SUTURE) ×6 IMPLANT
SUT PDS AB 1 CT1 36 (SUTURE) ×12 IMPLANT
SUT VIC AB 0 CT1 36 (SUTURE) ×6 IMPLANT
SUT VIC AB 1 CT1 36 (SUTURE) IMPLANT
SUT VIC AB 2-0 SH 27 (SUTURE)
SUT VIC AB 2-0 SH 27XBRD (SUTURE) IMPLANT
SUT VIC AB 2-0 UR6 27 (SUTURE) IMPLANT
SUT VIC AB 4-0 FS2 27 (SUTURE) ×3 IMPLANT
SUT VICRYL 0 AB UR-6 (SUTURE) ×3 IMPLANT
SYS LAPSCP GELPORT 120MM (MISCELLANEOUS) ×3
SYSTEM LAPSCP GELPORT 120MM (MISCELLANEOUS) ×1 IMPLANT
TRAY FOLEY MTR SLVR 16FR STAT (SET/KITS/TRAYS/PACK) ×3 IMPLANT
TROCAR ENDOPATH XCEL 12X100 BL (ENDOMECHANICALS) ×3 IMPLANT
TROCAR XCEL 12X100 BLDLESS (ENDOMECHANICALS) ×3 IMPLANT
TROCAR XCEL NON-BLD 5MMX100MML (ENDOMECHANICALS) ×3 IMPLANT

## 2019-11-30 NOTE — Anesthesia Procedure Notes (Addendum)
Procedure Name: Intubation Date/Time: 11/30/2019 12:02 PM Performed by: Kelton Pillar, CRNA Pre-anesthesia Checklist: Patient identified, Emergency Drugs available, Suction available and Patient being monitored Patient Re-evaluated:Patient Re-evaluated prior to induction Oxygen Delivery Method: Circle system utilized Preoxygenation: Pre-oxygenation with 100% oxygen Induction Type: IV induction Ventilation: Mask ventilation without difficulty Laryngoscope Size: Mac and 3 Grade View: Grade I Tube type: Oral Tube size: 7.0 mm Number of attempts: 1 Airway Equipment and Method: Stylet Placement Confirmation: ETT inserted through vocal cords under direct vision,  CO2 detector,  breath sounds checked- equal and bilateral and positive ETCO2 Secured at: 21 cm Tube secured with: Tape Dental Injury: Teeth and Oropharynx as per pre-operative assessment

## 2019-11-30 NOTE — Transfer of Care (Signed)
Immediate Anesthesia Transfer of Care Note  Patient: Chelsea Hicks  Procedure(s) Performed: HAND ASSISTED LAPAROSCOPIC NEPHRECTOMY (Left )  Patient Location: PACU  Anesthesia Type:General  Level of Consciousness: awake and drowsy  Airway & Oxygen Therapy: Patient Spontanous Breathing and Patient connected to face mask oxygen  Post-op Assessment: Report given to RN and Post -op Vital signs reviewed and stable  Post vital signs: Reviewed and stable  Last Vitals:  Vitals Value Taken Time  BP 139/125 11/30/19 1412  Temp    Pulse 108 11/30/19 1413  Resp 17 11/30/19 1413  SpO2 94 % 11/30/19 1413  Vitals shown include unvalidated device data.  Last Pain:  Vitals:   11/30/19 0949  TempSrc: Temporal  PainSc: 0-No pain         Complications: No apparent anesthesia complications

## 2019-11-30 NOTE — Anesthesia Postprocedure Evaluation (Signed)
Anesthesia Post Note  Patient: Chelsea Hicks  Procedure(s) Performed: HAND ASSISTED LAPAROSCOPIC NEPHRECTOMY (Left )  Patient location during evaluation: PACU Anesthesia Type: General Level of consciousness: awake and alert Pain management: pain level not controlled (Continuing to address pain issues) Vital Signs Assessment: post-procedure vital signs reviewed and stable Respiratory status: spontaneous breathing, nonlabored ventilation and respiratory function stable Cardiovascular status: blood pressure returned to baseline and stable Postop Assessment: no apparent nausea or vomiting Anesthetic complications: no     Last Vitals:  Vitals:   11/30/19 1430 11/30/19 1445  BP: 136/86 111/77  Pulse: (!) 104 93  Resp: 18 19  Temp:    SpO2: 100% 100%    Last Pain:  Vitals:   11/30/19 1415  TempSrc:   PainSc: 10-Worst pain ever                 Alphonsus Sias

## 2019-11-30 NOTE — Progress Notes (Signed)
Patient states she can't take a deep breath due to the pain. Spoke with Anesthesia changed pain medicaiton to hydromorphone. Patient appears to be in pain, unable to get comfortable. Tachypenic, shallow breaths. Reminded patient to slow breathing and try to breathe deep slow.

## 2019-11-30 NOTE — Progress Notes (Signed)
Patient itching from pain medication. Verbal order received for benadryl IV.

## 2019-11-30 NOTE — Anesthesia Preprocedure Evaluation (Signed)
Anesthesia Evaluation  Patient identified by MRN, date of birth, ID band Patient awake    Reviewed: Allergy & Precautions, H&P , NPO status , reviewed documented beta blocker date and time   History of Anesthesia Complications (+) PONV and history of anesthetic complications  Airway Mallampati: I  TM Distance: >3 FB Neck ROM: full    Dental  (+) Teeth Intact   Pulmonary    Pulmonary exam normal        Cardiovascular Normal cardiovascular exam     Neuro/Psych    GI/Hepatic neg GERD  ,  Endo/Other    Renal/GU      Musculoskeletal   Abdominal   Peds  Hematology  (+) Blood dyscrasia, anemia ,   Anesthesia Other Findings Past Medical History: 2013: Benign neoplasm of skin of upper limb, including shoulder 0762: Complication of anesthesia     Comment:  vomiting 2005: Crohn's disease ([place]) 2013: Diffuse cystic mastopathy 03/13/2019: Herpes zoster without complication 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 Vira Agar No date: DILATION AND CURETTAGE OF UTERUS No date: hemoglobin electrophinesis 2009: LIPOMA EXCISION     Comment:  from back 07/18/2012: LIPOMA EXCISION; Left     Comment:  arm No date: TUBAL LIGATION BMI    Body Mass Index: 24.79 kg/m     Reproductive/Obstetrics                             Anesthesia Physical Anesthesia Plan  ASA: II  Anesthesia Plan: General   Post-op Pain Management:    Induction: Intravenous  PONV Risk Score and Plan: Ondansetron and Treatment may vary due to age or medical condition  Airway  Management Planned: Oral ETT  Additional Equipment:   Intra-op Plan:   Post-operative Plan: Extubation in OR  Informed Consent: I have reviewed the patients History and Physical, chart, labs and discussed the procedure including the risks, benefits and alternatives for the proposed anesthesia with the patient or authorized representative who has indicated his/her understanding and acceptance.     Dental Advisory Given  Plan Discussed with: CRNA  Anesthesia Plan Comments:         Anesthesia Quick Evaluation

## 2019-11-30 NOTE — Interval H&P Note (Signed)
History and Physical Interval Note:  11/30/2019 11:10 AM  Chelsea Hicks  has presented today for surgery, with the diagnosis of left renal cell carcinoma.  The various methods of treatment have been discussed with the patient and family. After consideration of risks, benefits and other options for treatment, the patient has consented to  Procedure(s): HAND ASSISTED LAPAROSCOPIC NEPHRECTOMY (Left) as a surgical intervention.  The patient's history has been reviewed, patient examined, no change in status, stable for surgery.  I have reviewed the patient's chart and labs.  Questions were answered to the patient's satisfaction.    RRR CTAB   Hollice Espy

## 2019-11-30 NOTE — Op Note (Signed)
PREOP DIAGNOSIS: Left renal mass  POSTOPERATIVE DIAGNOSIS: Left renal mass  OPERATION PERFORMED: Hand-assisted laparoscopic left radical nephrectomy.   SURGEON: Hollice Espy, MD   Assistant:  Ross Marcus, MD   ANESTHESIA: General.   ESTIMATED BLOOD LOSS: 10  cc   DRAINS: 16-French Foley catheter.   COMPLICATIONS: None.  Indications: Endophytic central biopsy-proven left RCC  FINDINGS: Hilar anatomy: Arteries: 1 Veins: 1 Ureters: 1   DESCRIPTION OF OPERATION: Informed consent was obtained. The patient was marked on the left side. IV antibiotics were given for bacterial prophylaxis on call to the operating room. SCDs were provided for DVT prophylaxis. The patient was taken to the operating room and placed supine on the operating table. General anesthesia was provided. A Foley catheter was placed to drain the bladder.  The patient was positioned in right lateral decubitus with the left flank elevated about 70 degrees and the table flexed slightly. The left arm was placed in a padded airplane for support.  An axillary roll was not placed as the patient was in sloppy lateral and thus not on her axilla. The patient was secured to the table with soft straps and then prepped and draped sterilely.   We had a time-out confirming the patient identification, planned procedure, surgical site, and all present were in agreement. All present were in agreement.   A 8 cm incision was made for the hand port in the left lower quadrant. The anterior fascia was incised and elevated. The rectus belly was retracted medially and the peritoneum was incised. An incisional block was provided with liposomal Marcaine. The GelPort was assembled. A 12 mm trocar was placed above the umbilicus, and then the abdomen was insufflated. Laparoscopic survey revealed no abnormalities or injuries. An additional 12 mm trocar was just below  the subxiphoid. All port sites were then  infiltrated with liposomal Marcaine. Zero Vicryls were placed at the 12 mm trocar sites with the Carter-Thomason device for closure at the end of the case.   The white line of Toldt was incised. The colon was reflected from the spleen to the pelvis. Gerota's fascia was lifted up off the lower pole of the kidney and the ureter and gonadal vessels were identified. The gonadal vein was exposed and followed up to the main renal vein. The branch point of the gonadal vein and adrenal vein were exposed at the renal vein. The upper pole of the kidney was mobilized off of the quadratus lumborum and further mobilized away from the spleen.  Notably, there was some dense adhesions on the posterior upper pole location at the site of the previous biopsy. The adrenal gland was identified and spared during the upper pole dissection. The lower pole and lateral attachments were freed. The kidney was held laterally and the hilar dissection was completed.   The ureter was exposed, clipped distally using 12 mm clips, and divided sharply. The ureter stump was confirmed hemostatic.  At this point, the renal artery and vein were dividedwith a 60 mm vascular load staple using the battery operated endovascular stapler en block.  An endocatch bag was then used to bag the specimen.     Pneumoperitoneal pressure was reduced to 7 mmHg and the abdomen was inspected; hemostasis was confirmed. The 12 mm trocars were removed and the port sites closed with previously placed 0 Vicryl. The Gelport was removed. The anterior fascia was closed using #1 PDS in a figure-of-eight fashion.  The subcutaneous tissue was closed using 3-0 vicryl. All the incisions  were irrigated, patted dry, and then the skin was reapproximated with 4-0 Monocryl in a subcuticular fashion. The wounds were cleaned and dried and covered with Dermabond. All sponge, needle, and instrument counts were reported correct x2.   The patient was awakened from  anesthesia and transferred to recovery in stable condition. There were no complications. The patient tolerated the procedure well. ______________________________    Hollice Espy, MD

## 2019-12-01 DIAGNOSIS — C642 Malignant neoplasm of left kidney, except renal pelvis: Principal | ICD-10-CM

## 2019-12-01 LAB — CBC
HCT: 34 % — ABNORMAL LOW (ref 36.0–46.0)
Hemoglobin: 11.3 g/dL — ABNORMAL LOW (ref 12.0–15.0)
MCH: 31 pg (ref 26.0–34.0)
MCHC: 33.2 g/dL (ref 30.0–36.0)
MCV: 93.4 fL (ref 80.0–100.0)
Platelets: 255 10*3/uL (ref 150–400)
RBC: 3.64 MIL/uL — ABNORMAL LOW (ref 3.87–5.11)
RDW: 13.4 % (ref 11.5–15.5)
WBC: 10.3 10*3/uL (ref 4.0–10.5)
nRBC: 0 % (ref 0.0–0.2)

## 2019-12-01 LAB — BASIC METABOLIC PANEL
Anion gap: 7 (ref 5–15)
BUN: 11 mg/dL (ref 6–20)
CO2: 22 mmol/L (ref 22–32)
Calcium: 7.3 mg/dL — ABNORMAL LOW (ref 8.9–10.3)
Chloride: 107 mmol/L (ref 98–111)
Creatinine, Ser: 1.06 mg/dL — ABNORMAL HIGH (ref 0.44–1.00)
GFR calc Af Amer: >60 mL/min (ref >60)
GFR calc non Af Amer: 60 mL/min — ABNORMAL LOW (ref >60)
Glucose, Bld: 104 mg/dL — ABNORMAL HIGH (ref 70–99)
Potassium: 3.7 mmol/L (ref 3.5–5.1)
Sodium: 136 mmol/L (ref 135–145)

## 2019-12-01 MED ORDER — OXYCODONE-ACETAMINOPHEN 5-325 MG PO TABS: 1.0000 | ORAL_TABLET | Freq: Four times a day (QID) | ORAL | 0 refills | Status: DC | PRN

## 2019-12-01 MED ORDER — DOCUSATE SODIUM 100 MG PO CAPS: 100.0000 mg | ORAL_CAPSULE | Freq: Two times a day (BID) | ORAL | 0 refills | Status: DC

## 2019-12-01 NOTE — Plan of Care (Signed)
Discharge order received. Patient mental status is at baseline. Vital signs stable . No signs of acute distress. Discharge instructions given. Patient verbalized understanding. No other issues noted at this time.

## 2019-12-01 NOTE — Discharge Summary (Signed)
Date of admission: 11/30/2019  Date of discharge: 12/01/2019  Admission diagnosis: Left renal mass  Discharge diagnosis: Same as above  Secondary diagnoses:  Patient Active Problem List   Diagnosis Date Noted  . Renal cell carcinoma, left ([place] [place]) 11/30/2019  . Crohn's disease ([place]) 07/21/2016  . Preventative health care 09/24/2014  . PMS (premenstrual syndrome) 09/24/2014  . Paresthesia of foot 08/10/2013  . Fibrocystic breast disease 07/06/2013  . Deficiency anemia 12/14/2007  . 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 HYPERGLYCEMIA 05/08/2000    History and Physical: For full details, please see admission history and physical. Briefly, Chelsea Hicks is a 54 y.o. year old patient admitted on 11/30/2019 for scheduled hand-assisted laparoscopic left radical nephrectomy with Dr. Erlene Quan for management of left renal mass with biopsy results of grade 2 RCC.   Hospital Course: Patient tolerated the procedure well.  She was then transferred to the floor after an uneventful PACU stay.  Her hospital course was uncomplicated.  On POD#1 she had met discharge criteria: was eating a regular diet, was up and ambulating independently,  pain was well controlled, was voiding without a catheter, and was ready for discharge.  Laboratory values:  Recent Labs    12/01/19 0620  WBC 10.3  HGB 11.3*  HCT 34.0*   Recent Labs    12/01/19 0620  NA 136  K 3.7  CL 107  CO2 22  GLUCOSE 104*  BUN 11  CREATININE 1.06*  CALCIUM 7.3*   No results for input(s): LABPT, INR in the last 72 hours. Results for orders placed or performed during the hospital encounter of 11/28/19  SARS CORONAVIRUS 2 (TAT 6-24 HRS) Nasopharyngeal Nasopharyngeal Swab     Status: None   Collection Time: 11/28/19 10:22 AM   Specimen: Nasopharyngeal Swab  Result Value Ref Range Status   SARS Coronavirus 2 NEGATIVE NEGATIVE Final    Comment: (NOTE) SARS-CoV-2 target nucleic acids are NOT DETECTED. The SARS-CoV-2 RNA is  generally detectable in upper and lower respiratory specimens during the acute phase of infection. Negative results do not preclude SARS-CoV-2 infection, do not rule out co-infections with other pathogens, and should not be used as the sole basis for treatment or other patient management decisions. Negative results must be combined with clinical observations, patient history, and epidemiological information. The expected result is Negative. Fact Sheet for Patients: SugarRoll.be Fact Sheet for Healthcare Providers: https://www.woods-mathews.com/ This test is not yet approved or cleared by the [place] FDA and  has been authorized for detection and/or diagnosis of SARS-CoV-2 by FDA under an Emergency Use Authorization (EUA). This EUA will remain  in effect (meaning this test can be used) for the duration of the COVID-19 declaration under Section 56 4(b)(1) of the Act, 21 U.S.C. section 360bbb-3(b)(1), unless the authorization is terminated or revoked sooner. Performed at [facility], [place] [address], [place], [place] [address]    Disposition: Home  Discharge instruction: The patient was instructed to be ambulatory but told to refrain from heavy lifting, strenuous activity, or driving.  She was provided written and verbal instructions on post nephrectomy care and long-term management of solitary kidney.  Discharge medications:  Allergies as of 12/01/2019      Reactions   Amoxicillin-pot Clavulanate Hives   Penicillins Hives   Did it involve swelling of the face/tongue/throat, SOB, or low BP? No Did it involve sudden or severe rash/hives, skin peeling, or any reaction on the inside of your mouth or nose? No Did you need  to seek medical attention at a hospital or doctor's office? No When did it last happen?More than 15 years ago If all above answers are "NO", may proceed with cephalosporin use.   Sulfonamide Derivatives Hives       Medication List    TAKE these medications   azaTHIOprine 50 MG tablet Commonly known as: IMURAN Take 150 mg by mouth daily.   docusate sodium 100 MG capsule Commonly known as: COLACE Take 1 capsule (100 mg total) by mouth 2 (two) times daily.   ferrous sulfate 325 (65 FE) MG tablet Take 325 mg by mouth daily with breakfast.   multivitamin with minerals Tabs tablet Take 1 tablet by mouth daily.   oxyCODONE-acetaminophen 5-325 MG tablet Commonly known as: PERCOCET/ROXICET Take 1-2 tablets by mouth every 6 (six) hours as needed for moderate pain.   vitamin B-12 1000 MCG tablet Commonly known as: CYANOCOBALAMIN Take 1,000 mcg by mouth daily.   Vitamin D3 25 MCG tablet Commonly known as: Vitamin D Take 1,000 Units by mouth daily.       Followup:  4-week postop follow-up with Dr. Erlene Quan as below. Future Appointments  Date Time Provider [place]  12/27/2019  2:15 PM Hollice Espy, MD [facility] None

## 2019-12-01 NOTE — Discharge Instructions (Signed)
How to manage your solitary kidney: 1. Make sure all of your medical providers are aware that you only have one kidney. 2. Avoid nephrotoxic medications including NSAIDs (e.g. ibuprofen, naproxen). 3. Stay well hydrated. 4. Work with your healthcare providers moving forward to make sure that your blood pressure remains well-controlled.

## 2019-12-01 NOTE — Progress Notes (Signed)
Urology Inpatient Progress Note  Subjective: Chelsea Hicks is a 54 y.o. female admitted on 11/30/2019 for scheduled hand-assisted laparoscopic left radical nephrectomy with Dr. Erlene Quan for management of a left renal mass.  Surgery without complications.  Creatinine up today, 1.06.  Hemoglobin down today, 11.3.  Foley catheter in place draining clear, yellow urine.  Patient reports abdominal discomfort and gas pressure following surgery.  She has ambulated.  She has had some mild nausea without vomiting.  She is tolerating p.o. intake.  No flatus yet.  She is accompanied today by her husband at the bedside.  They have several questions today regarding long-term management of solitary kidney and pain control.  Anti-infectives: Anti-infectives (From admission, onward)   Start     Dose/Rate Route Frequency Ordered Stop   11/30/19 0944  clindamycin (CLEOCIN) 900 MG/50ML IVPB    Note to Pharmacy: Leonia Reader   : cabinet override      11/30/19 0944 11/30/19 1330   11/30/19 0825  clindamycin (CLEOCIN) IVPB 900 mg     900 mg 100 mL/hr over 30 Minutes Intravenous 30 min pre-op 11/30/19 0825 11/30/19 1206      Current Facility-Administered Medications  Medication Dose Route Frequency Provider Last Rate Last Admin  . 0.9 %  sodium chloride infusion   Intravenous Continuous Hollice Espy, MD 125 mL/hr at 12/01/19 0546 Rate Verify at 12/01/19 0546  . acetaminophen (TYLENOL) tablet 650 mg  650 mg Oral Q4H PRN Hollice Espy, MD      . azaTHIOprine Ilean Skill) tablet 150 mg  150 mg Oral Daily Hollice Espy, MD   150 mg at 12/01/19 1610  . Chlorhexidine Gluconate Cloth 2 % PADS 6 each  6 each Topical Daily Hollice Espy, MD      . diphenhydrAMINE (BENADRYL) injection 12.5 mg  12.5 mg Intravenous Q6H PRN Hollice Espy, MD       Or  . diphenhydrAMINE (BENADRYL) 12.5 MG/5ML elixir 12.5 mg  12.5 mg Oral Q6H PRN Hollice Espy, MD      . docusate sodium (COLACE) capsule 100 mg  100 mg Oral  BID Hollice Espy, MD   100 mg at 12/01/19 9604  . ferrous sulfate tablet 325 mg  325 mg Oral Q breakfast Hollice Espy, MD   325 mg at 12/01/19 0909  . heparin injection 5,000 Units  5,000 Units Subcutaneous Q8H Hollice Espy, MD   5,000 Units at 12/01/19 0525  . morphine 4 MG/ML injection 2-4 mg  2-4 mg Intravenous Q2H PRN Hollice Espy, MD   4 mg at 12/01/19 0409  . ondansetron (ZOFRAN) injection 4 mg  4 mg Intravenous Q4H PRN Hollice Espy, MD   4 mg at 12/01/19 0416  . opium-belladonna (B&O SUPPRETTES) 16.2-60 MG suppository 1 suppository  1 suppository Rectal Q6H PRN Hollice Espy, MD      . oxybutynin (DITROPAN) tablet 5 mg  5 mg Oral Q8H PRN Hollice Espy, MD      . oxyCODONE-acetaminophen (PERCOCET/ROXICET) 5-325 MG per tablet 1-2 tablet  1-2 tablet Oral Q4H PRN Hollice Espy, MD       Objective: Vital signs in last 24 hours: Temp:  [97.6 F (36.4 C)-98.4 F (36.9 C)] 97.9 F (36.6 C) (03/26 0407) Pulse Rate:  [64-109] 64 (03/26 0407) Resp:  [15-22] 20 (03/26 0407) BP: (102-141)/(62-93) 102/67 (03/26 0407) SpO2:  [93 %-100 %] 99 % (03/26 0407) Weight:  [67.6 kg] 67.6 kg (03/25 0949)  Intake/Output from previous day: 03/25 0701 - 03/26 0700 In: 3234.3 [I.V.:3234.3]  Out: 710 [Urine:700; Blood:10] Intake/Output this shift: No intake/output data recorded.  Physical Exam Vitals and nursing note reviewed.  Constitutional:      General: She is not in acute distress.    Appearance: Normal appearance. She is not ill-appearing, toxic-appearing or diaphoretic.  HENT:     Head: Normocephalic and atraumatic.  Pulmonary:     Effort: Pulmonary effort is normal. No respiratory distress.  Abdominal:     Palpations: Abdomen is soft.     Tenderness: There is no guarding or rebound.     Comments: Surgical incisions visualized over the anterior abdomen, all clean, dry, and intact with overlying surgical adhesive.  Minimal postoperative bruising.  No flank ecchymosis.   Skin:    General: Skin is warm and dry.  Neurological:     Mental Status: She is alert and oriented to person, place, and time.  Psychiatric:        Mood and Affect: Mood normal.        Behavior: Behavior normal.    Lab Results:  Recent Labs    11/28/19 1022 12/01/19 0620  WBC 5.2 10.3  HGB 13.5 11.3*  HCT 40.3 34.0*  PLT 376 255   BMET Recent Labs    11/28/19 1022 12/01/19 0620  NA 140 136  K 4.1 3.7  CL 106 107  CO2 27 22  GLUCOSE 92 104*  BUN 8 11  CREATININE 0.63 1.06*  CALCIUM 8.8* 7.3*   PT/INR Recent Labs    11/28/19 1022  LABPROT 13.1  INR 1.0   Assessment & Plan: 54 year old female s/p hand-assisted laparoscopic left radical nephrectomy for management of a left renal mass.  Typical postoperative findings today, including slight elevation in creatinine, slight decrease in hemoglobin, and abdominal soreness at her surgical sites.  Overall, she is recovering well.  We will plan for discharge later today.  Foley catheter to be discontinued before discharge.  I had a lengthy conversation with the patient and her husband today regarding care for solitary kidney.  I counseled her to avoid nephrotoxic agents, including NSAIDs, and encouraged her to disclose her solitary kidney status to all healthcare providers moving forward.  I encouraged her to manage her postoperative pain with prescribed narcotics and relieve constipation with Colace, fluid intake, and ambulation.  Additionally, I counseled them on the use of over-the-counter Gas-X for relief of gas pressure as needed.  They expressed understanding.  Debroah Loop, PA-C 12/01/2019

## 2019-12-02 ENCOUNTER — Ambulatory Visit: Payer: BC Managed Care – PPO

## 2019-12-05 LAB — SURGICAL PATHOLOGY

## 2019-12-09 ENCOUNTER — Ambulatory Visit: Payer: BC Managed Care – PPO | Attending: Internal Medicine

## 2019-12-09 DIAGNOSIS — Z23 Encounter for immunization: Secondary | ICD-10-CM

## 2019-12-09 NOTE — Progress Notes (Signed)
   Covid-19 Vaccination Clinic  Name:  Chelsea Hicks    MRN: [id number] DOB: 07-25-1966  12/09/2019  [name] was observed post Covid-19 immunization for 15 minutes without incident. She was provided with Vaccine Information Sheet and instruction to access the V-Safe system.   [name] was instructed to call 911 with any severe reactions post vaccine: Marland Kitchen Difficulty breathing  . Swelling of face and throat  . A fast heartbeat  . A bad rash all over body  . Dizziness and weakness   Immunizations Administered    Name Date Dose VIS Date Route   Moderna COVID-19 Vaccine 12/09/2019  8:56 AM 0.5 mL 08/08/2019 Intramuscular   Manufacturer: Levan Hurst   Lot: [id number]   [place]: [id number]

## 2019-12-11 ENCOUNTER — Telehealth: Payer: Self-pay | Admitting: *Deleted

## 2019-12-11 NOTE — Telephone Encounter (Signed)
-----   Message from Hollice Espy, MD sent at 12/11/2019  3:43 PM EDT ----- Please let this patient know that her results are available via MyChart which she probably is already seen.  All of the tumor is out and the margins are negative which is awesome news!!!!  Hollice Espy, MD

## 2019-12-11 NOTE — Telephone Encounter (Addendum)
Patient informed, voiced understanding.   ----- Message from Hollice Espy, MD sent at 12/11/2019  3:43 PM EDT ----- Please let this patient know that her results are available via MyChart which she probably is already seen.  All of the tumor is out and the margins are negative which is awesome news!!!!  Hollice Espy, MD

## 2019-12-12 ENCOUNTER — Ambulatory Visit: Payer: BC Managed Care – PPO

## 2019-12-27 ENCOUNTER — Other Ambulatory Visit: Payer: Self-pay

## 2019-12-27 ENCOUNTER — Ambulatory Visit: Payer: BC Managed Care – PPO | Admitting: Urology

## 2019-12-27 ENCOUNTER — Encounter: Payer: Self-pay | Admitting: Urology

## 2019-12-27 VITALS — BP 137/86 | HR 70 | Ht 65.0 in | Wt 149.0 lb

## 2019-12-27 DIAGNOSIS — C642 Malignant neoplasm of left kidney, except renal pelvis: Secondary | ICD-10-CM

## 2019-12-27 NOTE — Progress Notes (Signed)
12/27/19 1:10 PM   Chelsea Hicks Feb 08, 1966 [id number]  Referring provider: Pleas Koch, NP [name],  [place] [address]  Chief Complaint  Patient presents with  . Routine Post Op    HPI: Chelsea Hicks is a 54 y.o. F with a history of Crohn's disease and renal mass returns today for 4 week f/u s/p hand assisted laparoscopic nephrectomy.   On 10/11/19 she underwent a screening CT scan given her history of Crohn's disease. This indicated an incidental 3.0 cm left endophytic solid enhancing renal mass concerning for possible renal cell carcinoma.  She returned on 11/15/19 for follow-up following renal mass biopsy.  She did receive IV contrast as the lesion itself was very difficult to identify even on CT scan.  Ultimately the procedure was successful.  There was concern for perinephric hematoma. Surgical pathology report from 11/08/19 shows tumor measuring at least 3 cm in biopsy sample and renal cell carcinoma grade 2. No evidence of sacromatoid or rhabdoid features.   She underwent hand assisted laparoscopic nephrectomy on 11/30/19. 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   She has a previous SHx of a cholecystectomyand bowel resection.  Today, she reports of bowel issues however has been drinking plenty of water and following a healthy diet. Otherwise, no GU complaints.   PMH: Past Medical History:  Diagnosis Date  . 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 Complication of anesthesia 1995   vomiting  . Crohn's disease ([place]) 2005  . Diffuse cystic mastopathy 2013  . Herpes zoster without complication 0/04/6760  . Hypertrophic lichen planus   . Lichen simplex chronicus   . PONV (postoperative nausea and vomiting)   . Regional enteritis of unspecified site   . Renal cell carcinoma, left ([place]) 11/30/2019  .  Special screening for malignant neoplasms, colon     Surgical History: Past Surgical History:  Procedure Laterality Date  . bowel rescetion  07/2009  . BREAST BIOPSY Left 02-11-04   core bx,benign  . CHOLECYSTECTOMY  12/2001  . COLONOSCOPY  2011   Dr. Vira Agar  . DILATION AND CURETTAGE OF UTERUS    . hemoglobin electrophinesis    . LAPAROSCOPIC NEPHRECTOMY, HAND ASSISTED Left 11/30/2019   Procedure: HAND ASSISTED LAPAROSCOPIC NEPHRECTOMY;  Surgeon: Hollice Espy, MD;  Location: [facility] ORS;  Service: Urology;  Laterality: Left;  . LIPOMA EXCISION  2009   from back  . LIPOMA EXCISION Left 07/18/2012   arm  . TUBAL LIGATION      Home Medications:  Allergies as of 12/27/2019      Reactions   Amoxicillin-pot Clavulanate Hives   Penicillins Hives   Did it involve swelling of the face/tongue/throat, SOB, or low BP? No Did it involve sudden or severe rash/hives, skin peeling, or any reaction on the inside of your mouth or nose? No Did you need to seek medical attention at a hospital or doctor's office? No When did it last happen?More than 15 years ago If all above answers are "NO", may proceed with cephalosporin use.   Sulfonamide Derivatives Hives      Medication List       Accurate as of December 27, 2019 11:59 PM. If you have any questions, ask your nurse or doctor.        STOP taking these medications   docusate sodium 100 MG capsule  Commonly known as: COLACE Stopped by: Hollice Espy, MD   oxyCODONE-acetaminophen 5-325 MG tablet Commonly known as: PERCOCET/ROXICET Stopped by: Hollice Espy, MD     TAKE these medications   azaTHIOprine 50 MG tablet Commonly known as: IMURAN Take 150 mg by mouth daily.   ferrous sulfate 325 (65 FE) MG tablet Take 325 mg by mouth daily with breakfast.   multivitamin with minerals Tabs tablet Take 1 tablet by mouth daily.   vitamin B-12 1000 MCG tablet Commonly known as: CYANOCOBALAMIN Take 1,000 mcg by mouth daily.   Vitamin  D3 25 MCG tablet Commonly known as: Vitamin D Take 1,000 Units by mouth daily.       Allergies:  Allergies  Allergen Reactions  . Amoxicillin-Pot Clavulanate Hives  . Penicillins Hives    Did it involve swelling of the face/tongue/throat, SOB, or low BP? No Did it involve sudden or severe rash/hives, skin peeling, or any reaction on the inside of your mouth or nose? No Did you need to seek medical attention at a hospital or doctor's office? No When did it last happen?More than 15 years ago If all above answers are "NO", may proceed with cephalosporin use.   . Sulfonamide Derivatives Hives    Family History: Family History  Problem Relation Age of Onset  . Cancer Maternal Grandfather        prostate  . Cancer Paternal Grandmother        breast  . Breast cancer Paternal Grandmother     Social History:  reports that she has never smoked. She has never used smokeless tobacco. She reports current alcohol use. She reports that she does not use drugs.   Physical Exam: BP 137/86   Pulse 70   Ht 5' 5"  (1.651 m)   Wt 149 lb (67.6 kg)   BMI 24.79 kg/m   Constitutional:  Alert and oriented, No acute distress. HEENT: [place] AT, moist mucus membranes.  Trachea midline, no masses. Cardiovascular: No clubbing, cyanosis, or edema. Respiratory: Normal respiratory effort, no increased work of breathing. GI: Abdomen is soft, nontender, nondistended, no abdominal masses. Incision healing.  Skin: No rashes, bruises or suspicious lesions. Neurologic: Grossly intact, no focal deficits, moving all 4 extremities. Psychiatric: Normal mood and affect.  Laboratory Data:  Lab Results  Component Value Date   CREATININE 0.98 12/27/2019   SURGICAL PATHOLOGY  CASE: [id number]  PATIENT: [name]  Surgical Pathology Report      Specimen Submitted:  A. Kidney, left   Clinical History: Left renal cell carcinoma      DIAGNOSIS:  A. KIDNEY, LEFT; RADICAL NEPHRECTOMY:  -  CLEAR CELL RENAL CELL CARCINOMA, SEE SUMMARY BELOW.  - BIOPSY SITE AND NEEDLE TRACK CHANGES ASSOCIATE WITH HEMATOMA AND  SCARRING.   CANCER CASE SUMMARY: KIDNEY  Procedure: Radical nephrectomy  Specimen Laterality: Left  Tumor Size: Greatest dimension 2.6 cm  Tumor Focality: Unifocal  Histologic Type: Clear cell renal cell carcinoma  Sarcomatoid Features: Not identified  Rhabdoid Features: Not identified  Histologic Grade (WHO / ISUP Grade): G2  Tumor Necrosis: Not identified  Tumor Extension: Tumor limited to kidney  Margins: Uninvolved by invasive carcinoma  Lymphovascular Invasion: Not identified  Regional Lymph Nodes: No lymph nodes submitted or found  Pathologic Stage Classification (pTNM, AJCC 8th Edition): pT1a pNX  Pathologic Findings in Non-neoplastic Kidney:    - 6% global glomerulosclerosis (PASD stain reviewed)   GROSS DESCRIPTION:  A. Labeled: Left kidney  Received: Formalin  Type of procedure: Radical nephrectomy  Laterality: Left  Weight of specimen: 267 grams  Size of specimen: Received is a radical nephrectomy specimen consisting  of a 10.5 x 5.5 x 3.5 cm kidney with surrounding perirenal fat up to 1.7  cm in thickness. Extending from the hilum is an 8.3 cm in length by 0.3  cm in diameter portion of ureter as well as hilar vasculature (renal  artery and renal vein) measuring 1.7 cm in length and ranging from 0.3  to 1.0 cm in diameter.  Orientation: The specimen is oriented at the time of grossing according  to anatomic features  Presence/absence of adrenal gland: Absent  Number of masses: 1  Size(s) of mass(es): 2.6 x 2.0 x 1.9 cm  Location of mass(es): Upper pole  Description of tumor: Sectioning displays a well-circumscribed mass with  a solid, yellow-orange cut surface admixed with multifocal,minimal  areas of hemorrhage and necrosis (1%).   Extent of invasion:    Peri-nephric tissue (beyond renal capsule): The mass grossly  appears to be  confined to the renal parenchyma and comes within 0.5 cm  of the renal capsule.    Renal sinus: The mass abuts the renal sinus with possible  involvement.    Beyond Gerota's fascia: The mass grossly appears to be confined to  the renal parenchyma.    Major vein: No evidence of gross involvement    Perihilar fat: No evidence of gross involvement, 1.4 cm from the  perihilar fat    Pelvicalyceal system: The mass abuts the renal pelvis with possible  involvement.  Adrenal gland: Surgically absent  Margins: Ureter resection margin - 9.3 cm, hilar renal artery resection  margin - 2.5 cm, hilar renal vein resection margin - 2.7 cm, and closest  radial perirenal fat resection margin - 2.2 cm.  Description of kidney away from tumor: Sectioning the remaining kidney  displays red-brown, grossly unremarkable renal parenchyma with a  distinct cortical medullary junction. No additional abnormalities are  grossly identified.  Lymph nodes: Palpation and dissection of the perihilar and perirenal fat  reveals no lymph node candidates.  Comments: The perirenal fat overlying the mass displays a 5.0 x 4.5 cm  focal area of dense adhesions and indurated adipose tissue displaying  multifocal areas of hemorrhage (suspicious for previous biopsy site  changes). No additional abnormalities are grossly identified.   Block summary:  1 - ureter and hilar vasculature resection margins (en face)  2 - perpendicular sections in relation to closest radial perirenal fat  resection margin (inked black)  3-6 - mass in relation to renal capsule, perirenal fat, and grossly  uninvolved renal parenchyma  7-11 - mass in relation to renal sinus  12-14 - mass in relation to renal sinus and renal pelvis (mass entirely  submitted)  15 - grossly normal kidney  16-17 - perirenal fat overlying mass suspicious for previous biopsy site  changes    Final Diagnosis performed by Bryan Lemma, MD.  Electronically  signed  12/05/2019 2:08:54PM  The electronic signature indicates that the named Attending Pathologist  has evaluated the specimen  Technical component performed at [facility], [address], [place],  [place] [address] Lab: [phone] Dir: Rush Farmer, MD, MMM  Professional component performed at [facility], [facility], [place], [place],  [address] Lab: [phone]  Dir: Dellia Nims. Reuel Derby, MD   I have reviewed surgical path.   Assessment & Plan:    1. Renal cell carcinoma  S/p hand assisted laparoscopic nephrectomy on 11/30/19  Surgical  path reviewed  According to guidelines recommended annual CT scan/Chest X-ray for 3 years   2. Solitary kidney  Advised to avoid NSAIDs/dehydration  BMP today to ensure/check new baseline creatinine   [facility] [address], [place], [place] [address] [phone]  I, Lucas Mallow, am acting as a scribe for Dr. Hollice Espy,  I have reviewed the above documentation for accuracy and completeness, and I agree with the above.   Hollice Espy, MD

## 2019-12-28 LAB — BASIC METABOLIC PANEL
BUN/Creatinine Ratio: 9 (ref 9–23)
BUN: 9 mg/dL (ref 6–24)
CO2: 26 mmol/L (ref 20–29)
Calcium: 9.1 mg/dL (ref 8.7–10.2)
Chloride: 102 mmol/L (ref 96–106)
Creatinine, Ser: 0.98 mg/dL (ref 0.57–1.00)
GFR calc Af Amer: 76 mL/min/{1.73_m2} (ref >59)
GFR calc non Af Amer: 66 mL/min/{1.73_m2} (ref >59)
Glucose: 85 mg/dL (ref 65–99)
Potassium: 3.9 mmol/L (ref 3.5–5.2)
Sodium: 140 mmol/L (ref 134–144)

## 2020-09-18 ENCOUNTER — Other Ambulatory Visit: Payer: BC Managed Care – PPO

## 2020-09-18 DIAGNOSIS — Z20822 Contact with and (suspected) exposure to covid-19: Secondary | ICD-10-CM

## 2020-09-19 LAB — NOVEL CORONAVIRUS, NAA: SARS-CoV-2, NAA: DETECTED — AB

## 2020-09-19 LAB — SARS-COV-2, NAA 2 DAY TAT

## 2020-09-20 ENCOUNTER — Telehealth: Payer: Self-pay

## 2020-09-20 NOTE — Telephone Encounter (Signed)
Called to discuss with patient about COVID-19 symptoms and the use of one of the available treatments for those with mild to moderate Covid symptoms and at a high risk of hospitalization.  Pt appears to qualify for outpatient treatment due to co-morbid conditions and/or a member of an at-risk group in accordance with the FDA Emergency Use Authorization.    Symptom onset: 09/17/20 Vaccinated: Yes Booster? Yes Immunocompromised? No Qualifiers: Crohn's  Unable to reach pt - Reached pt. Reports she is better and does not need further treatment.   Marcello Moores

## 2020-11-25 ENCOUNTER — Encounter: Payer: Self-pay | Admitting: Urology

## 2020-12-09 ENCOUNTER — Telehealth: Payer: Self-pay | Admitting: Primary Care

## 2020-12-09 ENCOUNTER — Other Ambulatory Visit: Payer: Self-pay | Admitting: Primary Care

## 2020-12-09 NOTE — Telephone Encounter (Signed)
Mrs. Cude called in wanted to know about getting a referral for mammogram.   Please advise

## 2020-12-10 NOTE — Telephone Encounter (Signed)
Left message to return call to our office.  Need to know if it is just screening of if patient has had any change and were she would like sent to?

## 2020-12-10 NOTE — Telephone Encounter (Signed)
PT CALLED BACK AND SHE STATED ITS FOR A ROUTINE AND SHE WANTED TO GO TO [place].

## 2020-12-11 NOTE — Telephone Encounter (Signed)
Patient not seen in over a year have app for may ok to send order or hold off until then?

## 2020-12-12 ENCOUNTER — Other Ambulatory Visit: Payer: Self-pay

## 2020-12-12 DIAGNOSIS — Z1231 Encounter for screening mammogram for malignant neoplasm of breast: Secondary | ICD-10-CM

## 2020-12-12 NOTE — Telephone Encounter (Signed)
Order placed and l/m for patient to call and set up exam.

## 2020-12-12 NOTE — Telephone Encounter (Signed)
Ok to send order for screening mammogram to [facility]

## 2020-12-23 ENCOUNTER — Ambulatory Visit
Admission: RE | Admit: 2020-12-23 | Discharge: 2020-12-23 | Disposition: A | Discharge status: Home / Self Care | Payer: BC Managed Care – PPO | Source: Ambulatory Visit | Attending: Primary Care | Admitting: Primary Care

## 2020-12-23 ENCOUNTER — Other Ambulatory Visit: Payer: Self-pay

## 2020-12-23 DIAGNOSIS — Z1231 Encounter for screening mammogram for malignant neoplasm of breast: Secondary | ICD-10-CM | POA: Diagnosis present

## 2020-12-23 IMAGING — MG MM DIGITAL SCREENING BILAT W/ TOMO AND CAD
8 series · 8 of 24 positions shown · non-contrast
Comparison: Previous exam(s).

CLINICAL DATA: Screening.

EXAM:
DIGITAL SCREENING BILATERAL MAMMOGRAM WITH TOMOSYNTHESIS AND CAD
TECHNIQUE: Bilateral screening digital craniocaudal and mediolateral oblique
mammograms were obtained. Bilateral screening digital breast
tomosynthesis was performed. The images were evaluated with
computer-aided detection.

[L MLO synth-2D]
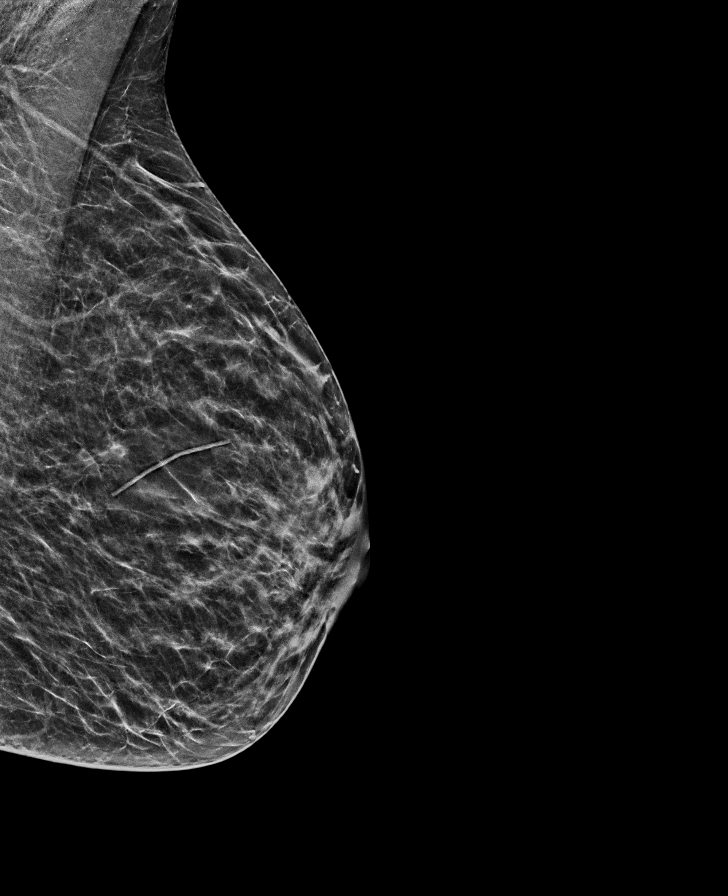

[L CC synth-2D]
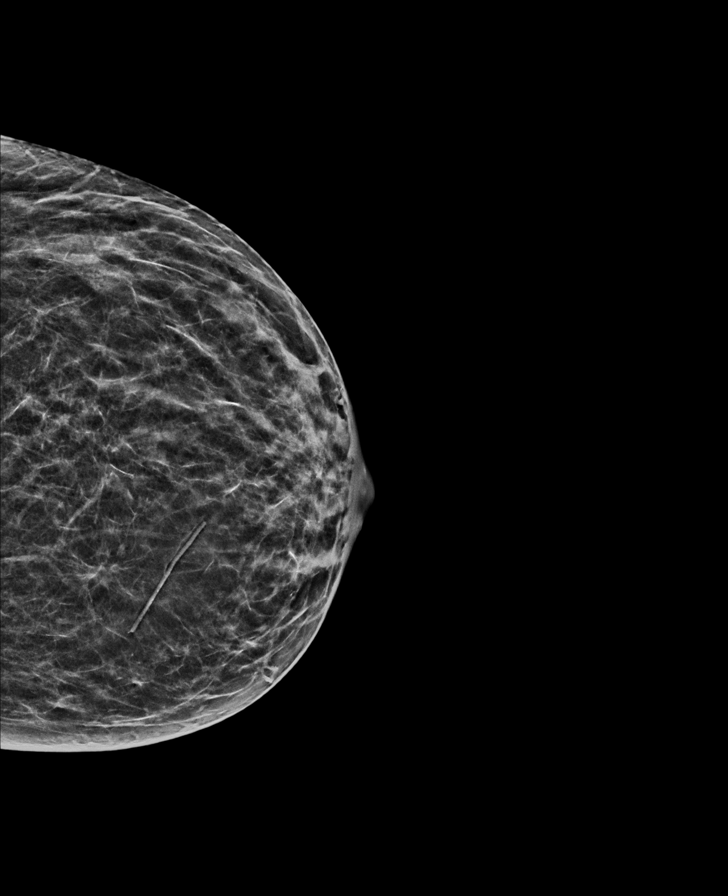

[R MLO synth-2D]
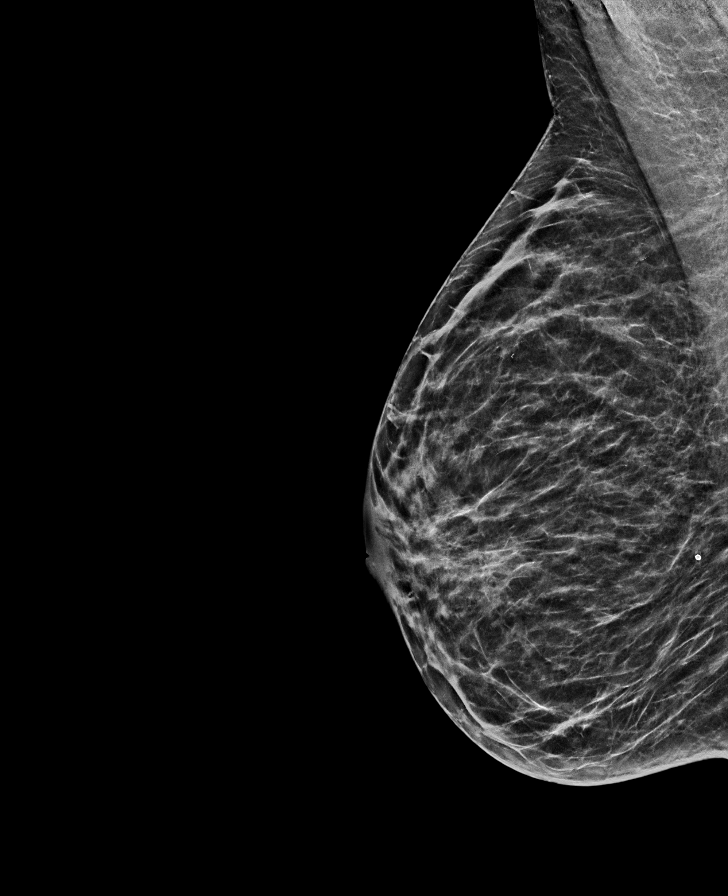

[R CC synth-2D]
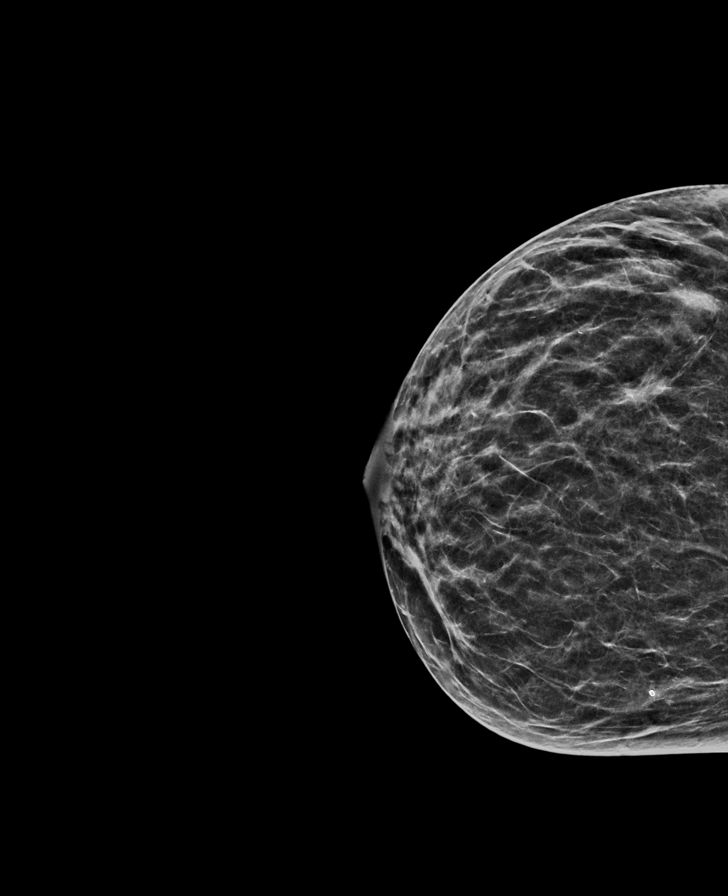

[R MLO tomo · tomo slice 28/55.0]
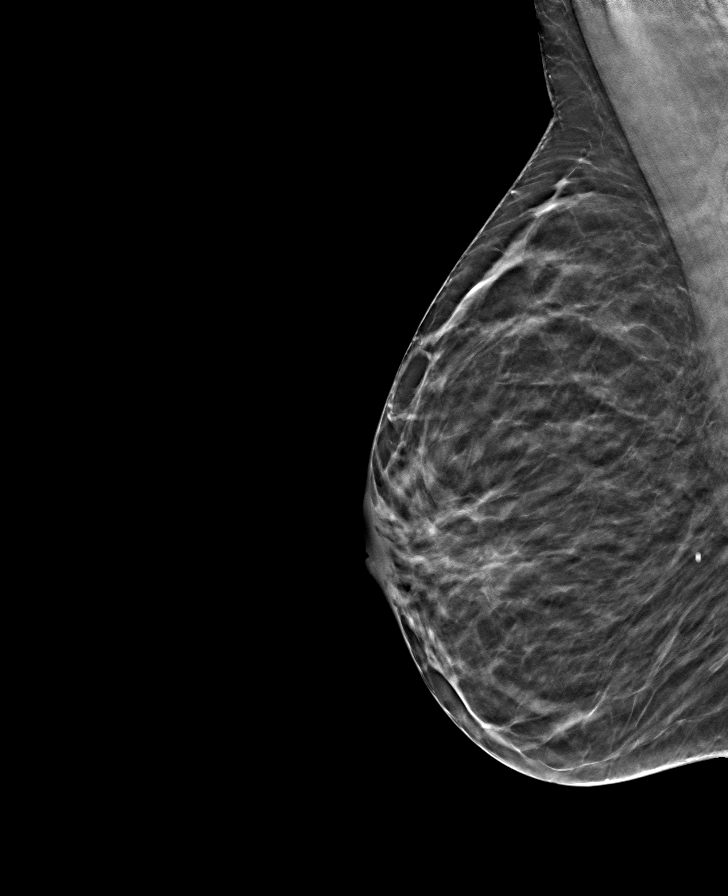

[L MLO tomo · tomo slice 27/52.0]
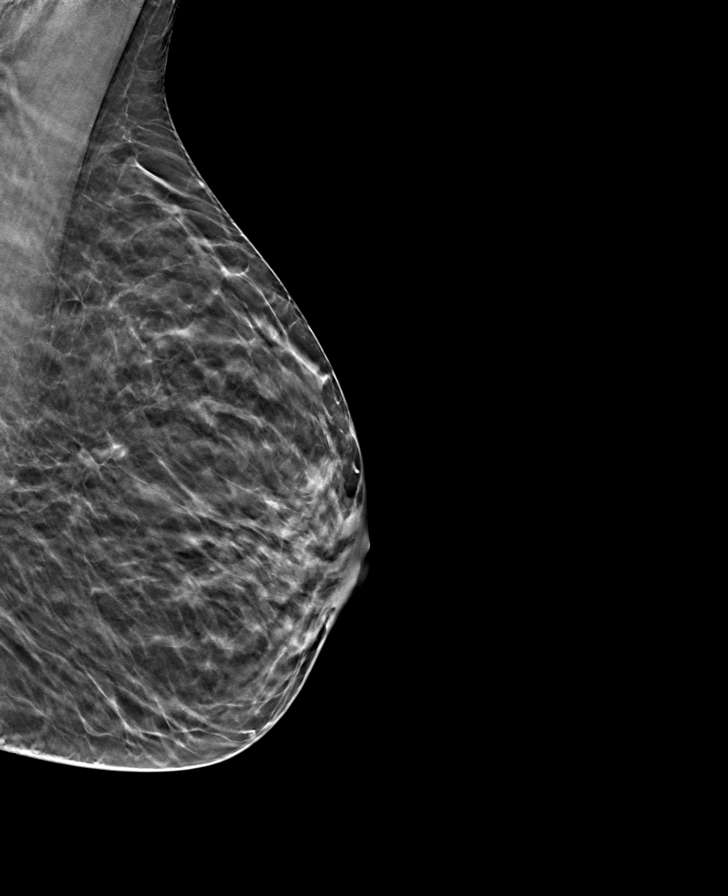

[R CC tomo · tomo slice 29/56.0]
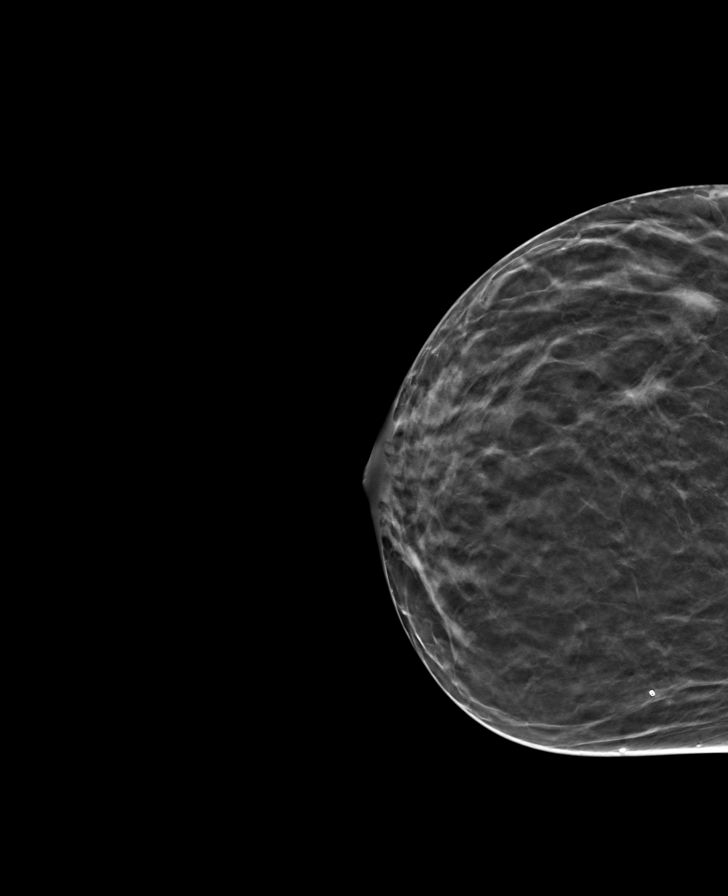

[L CC tomo · tomo slice 26/51.0]
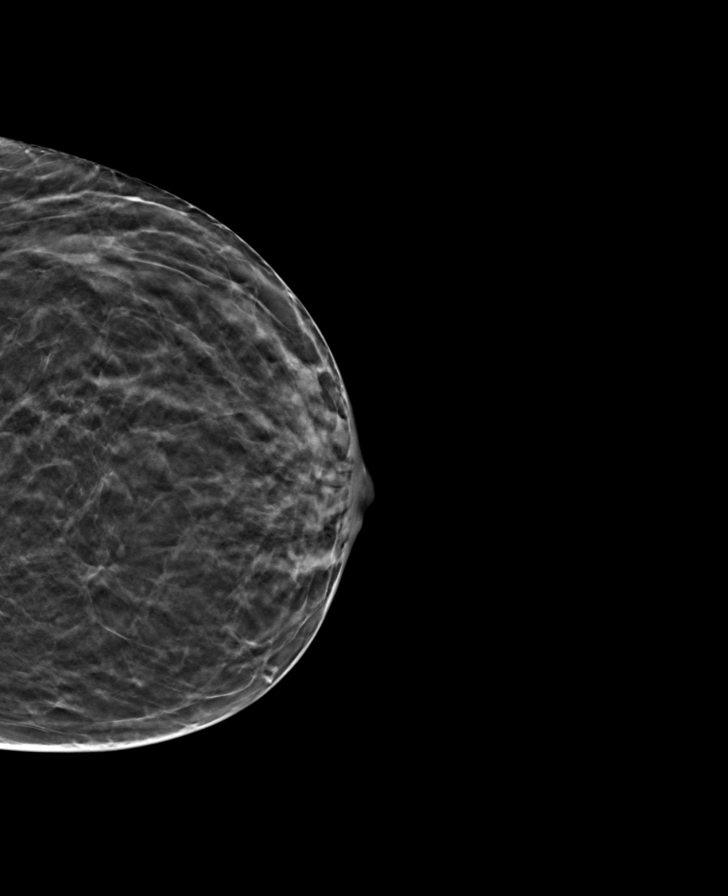

[8 of 24 positions shown; findings below may reference images not displayed]

ACR Breast Density Category b: There are scattered areas of
fibroglandular density.
FINDINGS: There are no findings suspicious for malignancy. Stable postsurgical
changes of the LEFT breast. The images were evaluated with
computer-aided detection.
IMPRESSION: No mammographic evidence of malignancy. A result letter of this
screening mammogram will be mailed directly to the patient.

RECOMMENDATION:
Screening mammogram in one year. (Code:D3-C-F91)

BI-RADS CATEGORY  2: Benign.

## 2020-12-24 ENCOUNTER — Ambulatory Visit: Admission: RE | Admit: 2020-12-24 | Payer: BC Managed Care – PPO | Source: Ambulatory Visit

## 2021-01-01 ENCOUNTER — Ambulatory Visit: Payer: Self-pay | Admitting: Urology

## 2021-01-15 ENCOUNTER — Ambulatory Visit
Admission: RE | Admit: 2021-01-15 | Discharge: 2021-01-15 | Disposition: A | Discharge status: Home / Self Care | Payer: BC Managed Care – PPO | Source: Ambulatory Visit | Attending: Urology | Admitting: Urology

## 2021-01-15 ENCOUNTER — Other Ambulatory Visit: Payer: Self-pay

## 2021-01-15 DIAGNOSIS — C642 Malignant neoplasm of left kidney, except renal pelvis: Secondary | ICD-10-CM

## 2021-01-15 IMAGING — CR DG CHEST 1V
1 series · 1 of 1 positions shown · non-contrast
Comparison: None.

CLINICAL DATA: 54-year-old female with kidney cancer status post
nephrectomy

EXAM:
CHEST  1 VIEW

[dg chest 1 view]
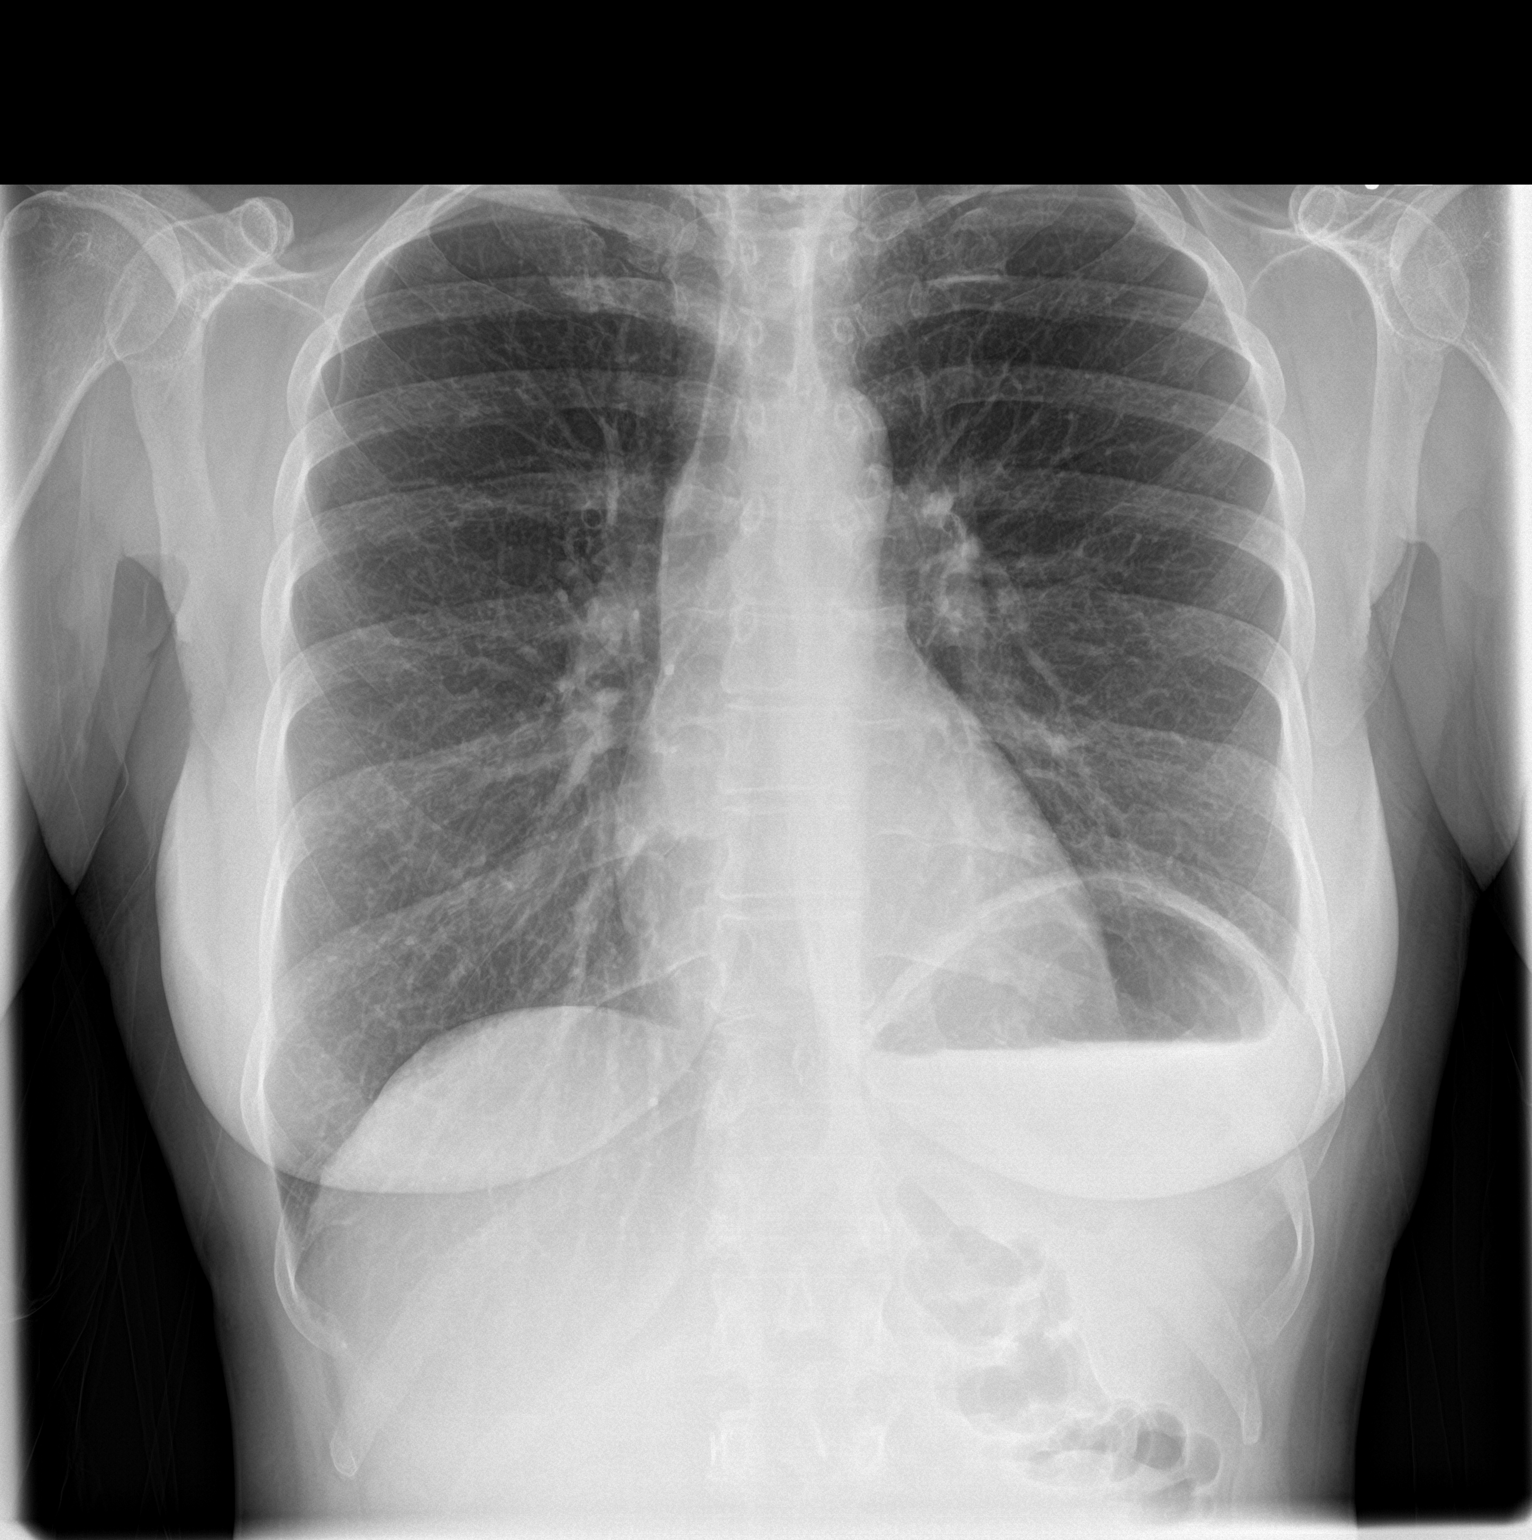

[1 of 1 positions shown; findings below may reference images not displayed]

FINDINGS: The heart size and mediastinal contours are within normal limits.
Both lungs are clear. The visualized skeletal structures are
unremarkable.
IMPRESSION: No active disease.

## 2021-01-15 IMAGING — CT CT ABD-PELV W/ CM
2 of 5 series · 16 of 46 positions shown, 18 images · IV contrast (omnipaque)
Comparison: CT 10/11/2019

CLINICAL DATA: Urologic cancer surveillance.  LEFT renal cancer.

EXAM:
CT ABDOMEN AND PELVIS WITH CONTRAST
TECHNIQUE: Multidetector CT imaging of the abdomen and pelvis was performed
using the standard protocol following bolus administration of
intravenous contrast.
CONTRAST:  100mL OMNIPAQUE IOHEXOL 300 MG/ML  SOLN

[Series 2: abd pelvis 5.00 · axial · 0.78mm/px · z∈[-1526,-1136]mm · 13 of 90 slices shown, 15 images]
[im 6/90  soft-tissue]
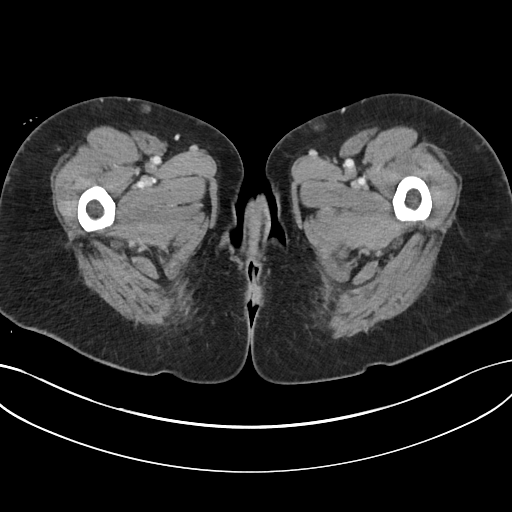
[im 6/90  bone]
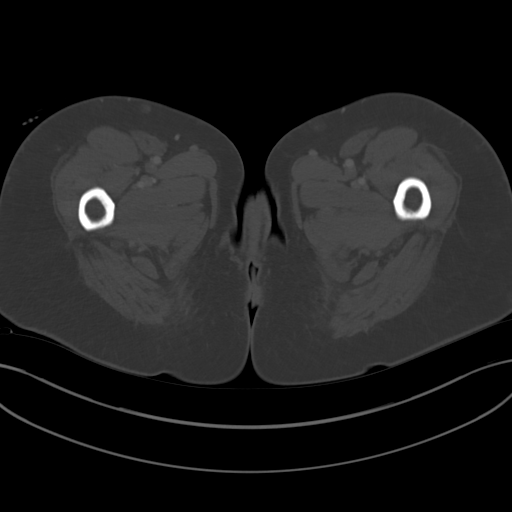
[im 12/90  soft-tissue]
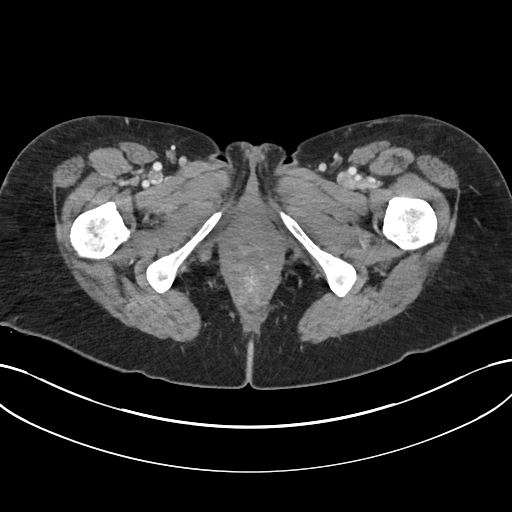
[im 17/90  soft-tissue]
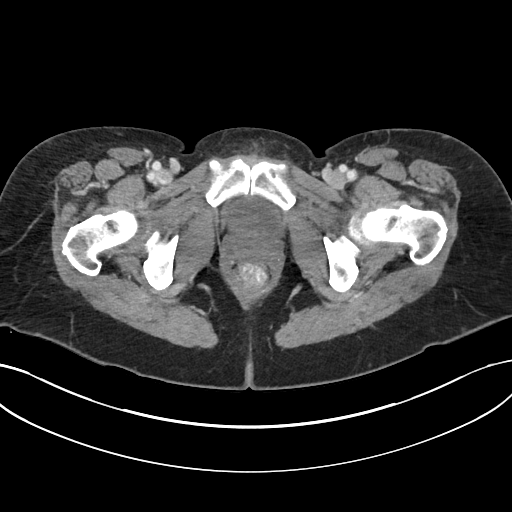
[im 28/90  soft-tissue]
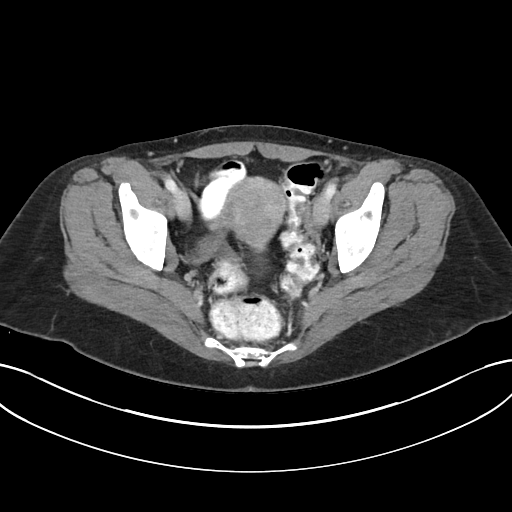
[im 34/90  soft-tissue]
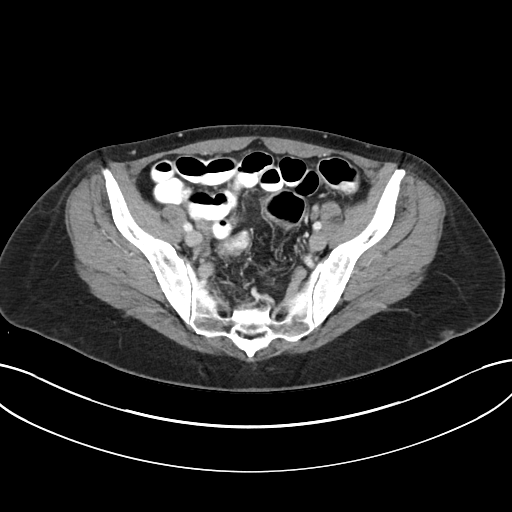
[im 39/90  soft-tissue]
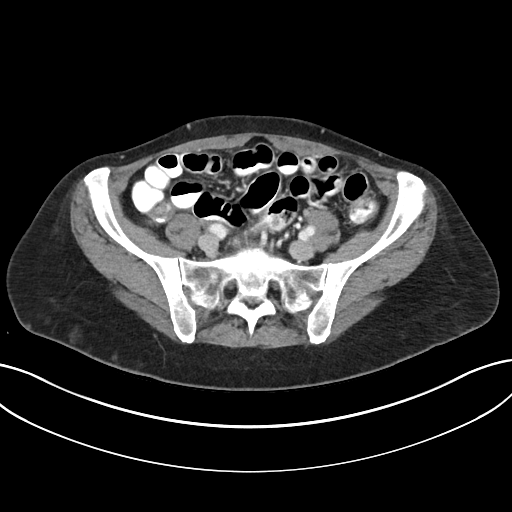
[im 45/90  soft-tissue]
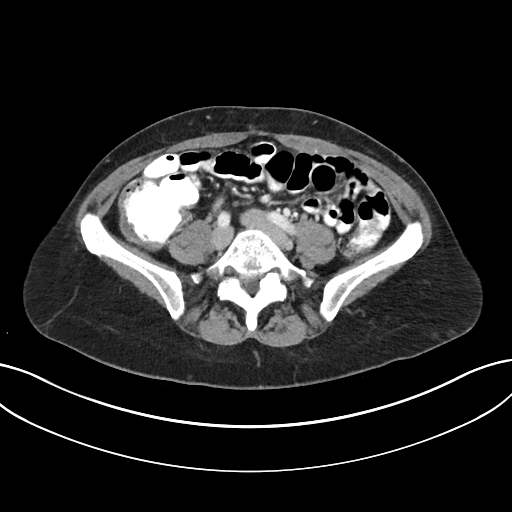
[im 51/90  soft-tissue]
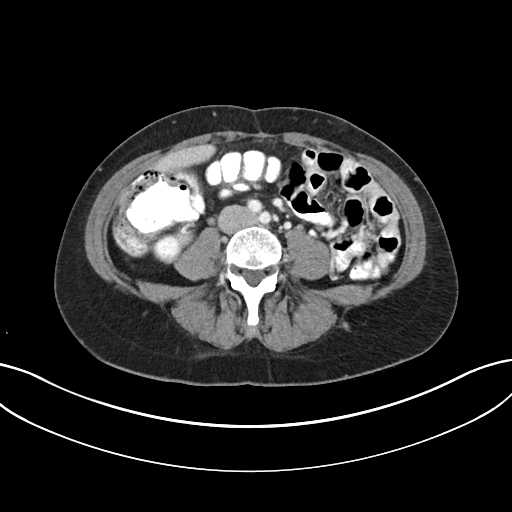
[im 56/90  soft-tissue]
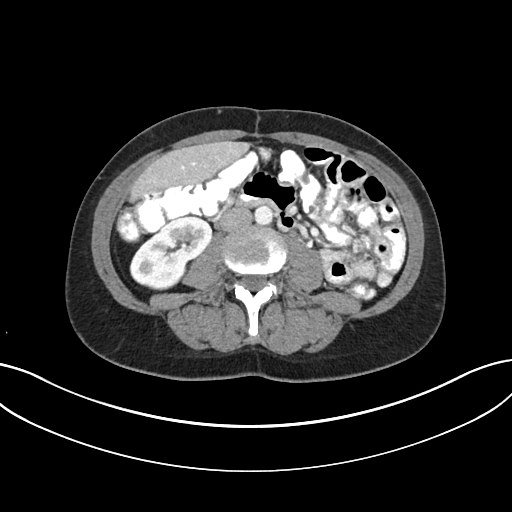
[im 56/90  bone]
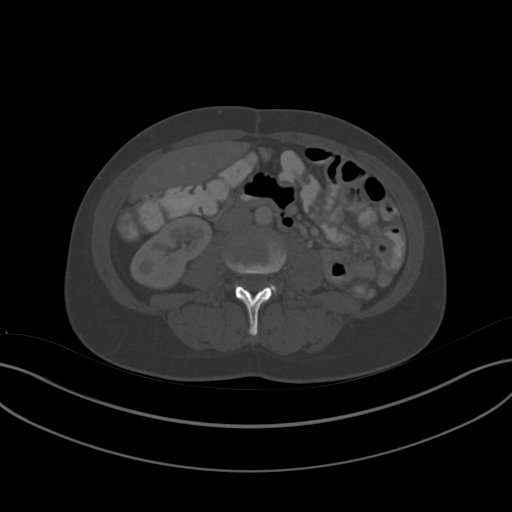
[im 62/90  soft-tissue]
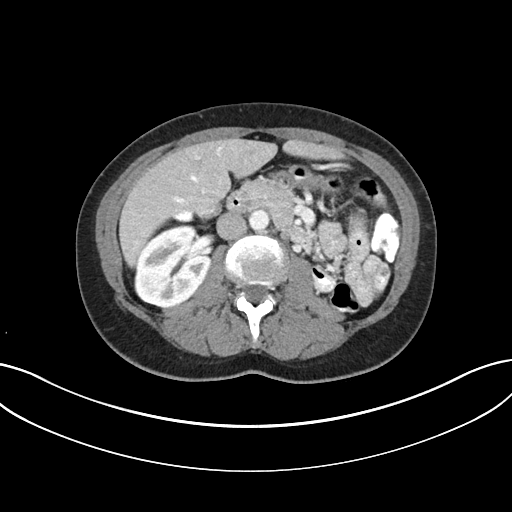
[im 73/90  soft-tissue]
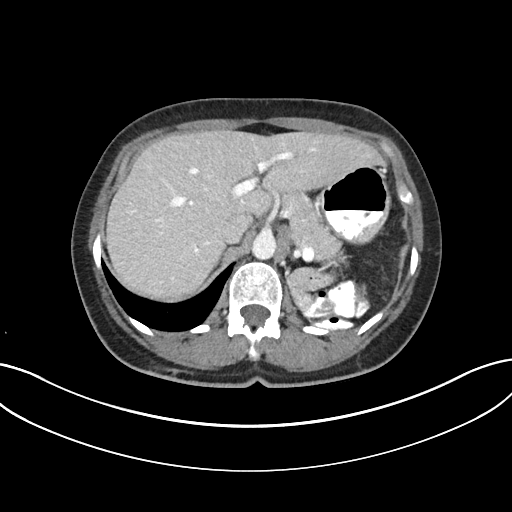
[im 78/90  soft-tissue]
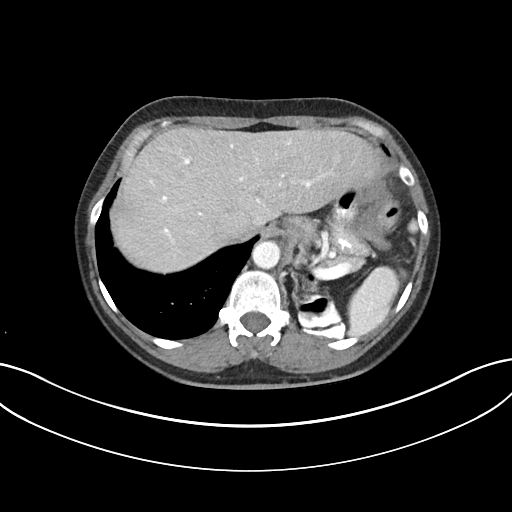
[im 84/90  soft-tissue]
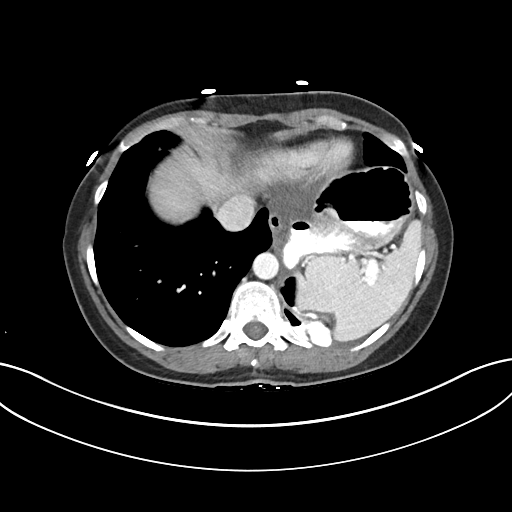

[Series 4: coronals abd pelvis 2.00 cor · coronal · 0.78mm/px · 3 of 126 slices shown]
[im 42/126  soft-tissue]
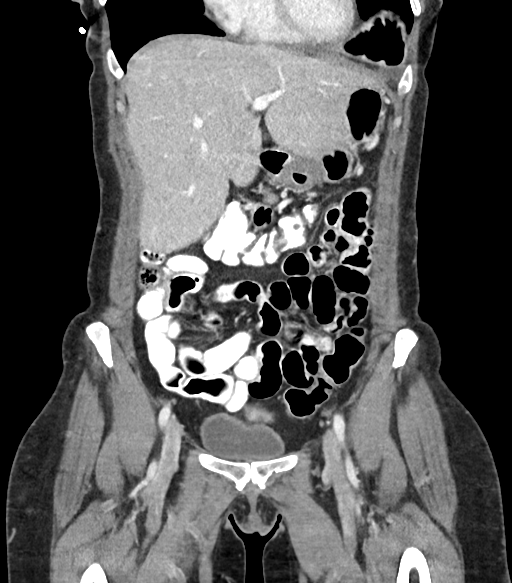
[im 56/126  soft-tissue]
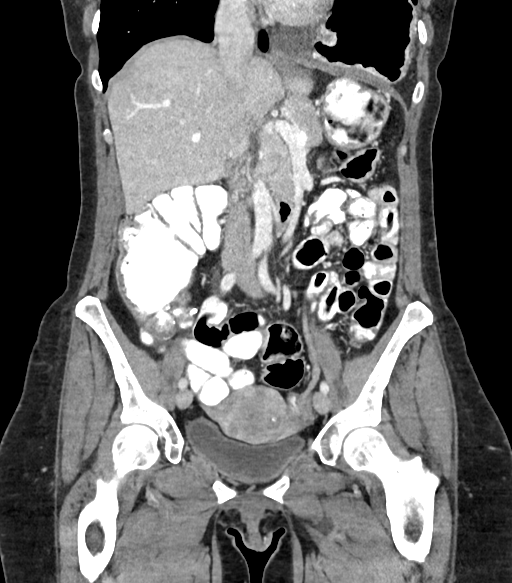
[im 70/126  soft-tissue]
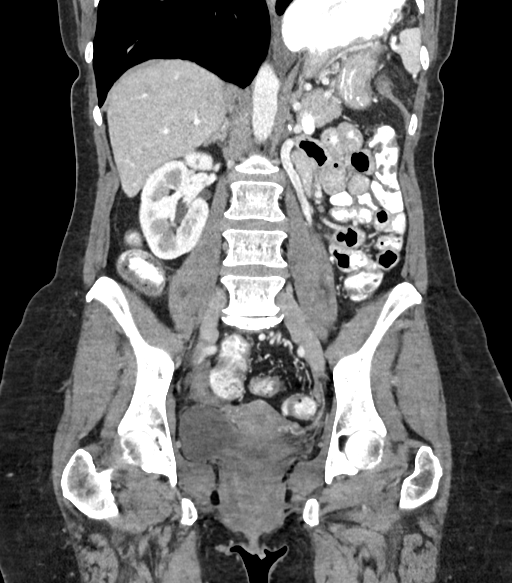

[16 of 46 positions shown; findings below may reference images not displayed]

FINDINGS: Lower chest: Small angular pleuroparenchymal nodularity at the
inferior lingula (image [DATE] similar comparison exam. No new
nodularity.

Hepatobiliary: No focal hepatic lesion. Postcholecystectomy. No
biliary dilatation.

Pancreas: Pancreas is normal. No ductal dilatation. No pancreatic
inflammation.

Spleen: Normal spleen

Adrenals/urinary tract: Adrenal glands normal. Post LEFT
nephrectomy. No nodularity in the nephrectomy bed. No
retroperitoneal adenopathy.

RIGHT kidney is normal.  RIGHT ureter and bladder normal.

Stomach/Bowel: There is fluid surrounding the distal esophagus and
GE junction (image [DATE]). There is a new large paraesophageal hernia
with the [DATE] of the stomach above the LEFT hemidiaphragm which is
new from pre-surgical exam. This is best seen on image 69/4. The
spleen also appears herniated (image 80/4.)

No obstructive findings are present. Small bowel is normal. Cecum is
normal. Colon rectosigmoid colon is normal.

Vascular/Lymphatic: Abdominal aorta is normal caliber with
atherosclerotic calcification. There is no retroperitoneal or
periportal lymphadenopathy.

Reproductive: Uterus and adnexa unremarkable.

Other: No free fluid.

Musculoskeletal: No aggressive osseous lesion.
IMPRESSION: 1. No evidence of renal cell carcinoma recurrence post LEFT
nephrectomy. No nodal metastasis or metastatic disease.
2. New large LEFT diaphragmatic hernia with the stomach and spleen
now apparently above the LEFT diaphragm. Para esophageal hernia
anatomy without evidence gastric obstruction. Recommend non emergent
surgical consultation.

These results will be called to the ordering clinician or
representative by the Radiologist Assistant, and communication
documented in the PACS or [REDACTED].

## 2021-01-15 MED ORDER — IOHEXOL 300 MG/ML  SOLN
100.0000 mL | Freq: Once | INTRAMUSCULAR | Status: AC | PRN
  Administered 2021-01-15: 100 mL via INTRAVENOUS

## 2021-01-22 ENCOUNTER — Encounter: Payer: Self-pay | Admitting: Urology

## 2021-01-22 ENCOUNTER — Ambulatory Visit: Payer: BC Managed Care – PPO | Admitting: Urology

## 2021-01-22 ENCOUNTER — Other Ambulatory Visit: Payer: Self-pay

## 2021-01-22 ENCOUNTER — Encounter: Payer: Self-pay | Admitting: Primary Care

## 2021-01-22 VITALS — BP 138/92 | HR 91 | Ht 65.0 in | Wt 136.0 lb

## 2021-01-22 DIAGNOSIS — K449 Diaphragmatic hernia without obstruction or gangrene: Secondary | ICD-10-CM | POA: Diagnosis not present

## 2021-01-22 DIAGNOSIS — C642 Malignant neoplasm of left kidney, except renal pelvis: Secondary | ICD-10-CM

## 2021-01-23 NOTE — Progress Notes (Signed)
01/22/2021 10:53 AM   Chelsea Hicks 1966-07-27 [id number]  Referring provider: Pleas Koch, NP [place],  [place] [address]  Chief Complaint  Patient presents with  . Renal cell carcinoma of left kidney [facility])    HPI: 55 year old female with a personal history of renal cell carcinoma who returns today for 1 year follow-up with routine imaging.  She underwent left hand assisted laparoscopic nephrectomy on 11/30/19 for an endophytic hilar lesion. 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   Her postoperative course was uncomplicated.  She had no pulmonary issues.  Notably, intraoperatively on review of op report indicates that there is significant adhesions as well as a perinephric hematoma, presumably secondary to the biopsy.  She reports that around March, she began experiencing severe sudden reflux type symptoms as well as sensation of esophageal fullness.  She is actually seeing gastroenterology was started on a PPI.  She also reports that she was advised to consider an endoscopy.  Incidentally on imaging in her follow-up visit, she was noted to have a diaphragmatic hernia on the left with chest cavity containing the entirety of her spleen as well as portion of her stomach.  There is no other evidence of recurrent disease.  Chest x-ray was unremarkable.  PMH: Past Medical History:  Diagnosis Date  . 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 Complication of anesthesia 1995   vomiting  . Crohn's disease ([place]) 2005  . Diffuse cystic mastopathy 2013  . Herpes zoster without complication 01/11/4331  . Hypertrophic lichen planus   . Lichen simplex chronicus   . PONV (postoperative nausea and vomiting)   . Regional enteritis of unspecified site   . Renal cell carcinoma, left ([place]) 11/30/2019  . Special screening for  malignant neoplasms, colon     Surgical History: Past Surgical History:  Procedure Laterality Date  . bowel rescetion  07/2009  . BREAST BIOPSY Left 02-11-04   core bx,benign  . CHOLECYSTECTOMY  12/2001  . COLONOSCOPY  2011   Dr. Vira Agar  . DILATION AND CURETTAGE OF UTERUS    . hemoglobin electrophinesis    . LAPAROSCOPIC NEPHRECTOMY, HAND ASSISTED Left 11/30/2019   Procedure: HAND ASSISTED LAPAROSCOPIC NEPHRECTOMY;  Surgeon: Hollice Espy, MD;  Location: [facility] ORS;  Service: Urology;  Laterality: Left;  . LIPOMA EXCISION  2009   from back  . LIPOMA EXCISION Left 07/18/2012   arm  . TUBAL LIGATION      Home Medications:  Allergies as of 01/22/2021      Reactions   Amoxicillin-pot Clavulanate Hives   Penicillins Hives   Did it involve swelling of the face/tongue/throat, SOB, or low BP? No Did it involve sudden or severe rash/hives, skin peeling, or any reaction on the inside of your mouth or nose? No Did you need to seek medical attention at a hospital or doctor's office? No When did it last happen?More than 15 years ago If all above answers are "NO", may proceed with cephalosporin use.   Sulfonamide Derivatives Hives      Medication List       Accurate as of Jan 22, 2021 11:59 PM. If you have any questions, ask your nurse or doctor.        azaTHIOprine 50 MG tablet Commonly known as: IMURAN Take 150 mg by mouth daily.   ferrous sulfate 325 (65  FE) MG tablet Take 325 mg by mouth daily with breakfast.   multivitamin with minerals Tabs tablet Take 1 tablet by mouth daily.   pantoprazole 40 MG tablet Commonly known as: PROTONIX Take by mouth.   vitamin B-12 1000 MCG tablet Commonly known as: CYANOCOBALAMIN Take 1,000 mcg by mouth daily.   Vitamin D3 25 MCG tablet Commonly known as: Vitamin D Take 1,000 Units by mouth daily.       Allergies:  Allergies  Allergen Reactions  . Amoxicillin-Pot Clavulanate Hives  . Penicillins Hives    Did it involve  swelling of the face/tongue/throat, SOB, or low BP? No Did it involve sudden or severe rash/hives, skin peeling, or any reaction on the inside of your mouth or nose? No Did you need to seek medical attention at a hospital or doctor's office? No When did it last happen?More than 15 years ago If all above answers are "NO", may proceed with cephalosporin use.   . Sulfonamide Derivatives Hives    Family History: Family History  Problem Relation Age of Onset  . Cancer Maternal Grandfather        prostate  . Cancer Paternal Grandmother        breast  . Breast cancer Paternal Grandmother   . Breast cancer Cousin        mat cousin    Social History:  reports that she has never smoked. She has never used smokeless tobacco. She reports current alcohol use. She reports that she does not use drugs.   Physical Exam: BP (!) 138/92   Pulse 91   Ht 5' 5"  (1.651 m)   Wt 136 lb (61.7 kg)   BMI 22.63 kg/m   Constitutional:  Alert and oriented, No acute distress. HEENT: [place] AT, moist mucus membranes.  Trachea midline, no masses. Cardiovascular: No clubbing, cyanosis, or edema. Respiratory: Normal respiratory effort, no increased work of breathing. Skin: No rashes, bruises or suspicious lesions. Neurologic: Grossly intact, no focal deficits, moving all 4 extremities. Psychiatric: Normal mood and affect.  Laboratory Data: Lab Results  Component Value Date   WBC 10.3 12/01/2019   HGB 11.3 (L) 12/01/2019   HCT 34.0 (L) 12/01/2019   MCV 93.4 12/01/2019   PLT 255 12/01/2019    Lab Results  Component Value Date   CREATININE 0.98 12/27/2019    Lab Results  Component Value Date   HGBA1C 5.3 02/09/2008     Pertinent Imaging: IMPRESSION: 1. No evidence of renal cell carcinoma recurrence post LEFT nephrectomy. No nodal metastasis or metastatic disease. 2. New large LEFT diaphragmatic hernia with the stomach and spleen now apparently above the LEFT diaphragm. Para esophageal  hernia anatomy without evidence gastric obstruction. Recommend non emergent surgical consultation.  These results will be called to the ordering clinician or representative by the Radiologist Assistant, and communication documented in the PACS or [employer].   Electronically Signed   By: Suzy Bouchard M.D.   On: 01/16/2021 15:51  CLINICAL DATA:  55 year old female with kidney cancer status post nephrectomy  EXAM: CHEST  1 VIEW  COMPARISON:  None.  FINDINGS: The heart size and mediastinal contours are within normal limits. Both lungs are clear. The visualized skeletal structures are unremarkable.  IMPRESSION: No active disease.   Electronically Signed   By: Corrie Mckusick D.O.   On: 01/17/2021 11:37  CT scan images were personally reviewed today.  I also personally discussed this case with general surgeon, Dr. Caroleen Hamman.  Assessment & Plan:  1. Renal cell carcinoma of left kidney (HCC) NED  Recommend continued annual surveillance for total of 3 years  Follow-up in a year with an chest x-ray and CT scan - Chest 1 View; Future - CT Abdomen Pelvis W Contrast; Future  2. Diaphragmatic hernia without obstruction and without gangrene Symptomatic hiatal hernia with early satiety and acid reflux  I explained that I do believe that this is result of weakening of her diaphragm from takedown of adhesions intraoperatively.  This likely did not occur acutely or immediately postop but likely more insidiously given her absence of symptoms until just 2 months ago.  I recommended staying on a PPI for the time being as well as try to support nutrition with Ensure/protein for the purpose of wound healing optimization  Plan for referral to Dr. Dahlia Byes for diaphragmatic hernia repair - Ambulatory referral to General Surgery   F/u 1 year CT abd/ pelvis + CXR   Hollice Espy, MD  [facility] [address] [place], [place] [address] [phone]

## 2021-02-05 ENCOUNTER — Ambulatory Visit: Payer: BC Managed Care – PPO | Admitting: Surgery

## 2021-02-05 ENCOUNTER — Encounter: Payer: Self-pay | Admitting: Surgery

## 2021-02-05 ENCOUNTER — Other Ambulatory Visit: Payer: Self-pay

## 2021-02-05 VITALS — BP 132/78 | HR 81 | Temp 98.4°F | Ht 65.0 in | Wt 137.6 lb

## 2021-02-05 DIAGNOSIS — K449 Diaphragmatic hernia without obstruction or gangrene: Secondary | ICD-10-CM

## 2021-02-05 NOTE — Patient Instructions (Addendum)
We would like you to have an upper endoscopy done. This can be done by Dr Alice Reichert at [facility]. We will send a referral to them, but you may want to call their office as you are a current patient.   We will get you scheduled for a Barium swallow to assess the area.  You are scheduled for a Barium Swallow at [facility] entrance on June 9th at 9:30 am. You will need to arrive there by 9:15 am and may not have anything to eat or drink for 3 hours before the scan.   We will have you follow up here after these studies are done. See your appointment below.

## 2021-02-06 ENCOUNTER — Encounter: Payer: Self-pay | Admitting: Surgery

## 2021-02-06 NOTE — Progress Notes (Signed)
Outpatient Surgical Follow Up  02/06/2021  Chelsea Hicks is an 55 y.o. female.   Chief Complaint  Patient presents with  . New Patient (Initial Visit)    HPI: 55 year old female seen in consultation at the request of Dr. Erlene Quan.  Did have a hand-assisted laparoscopic left radical nephrectomy for renal cell carcinoma by her on March 2021 and was uneventful.  She also had a left kidney biopsy as well that caused a hematoma.Marland Kitchen He presented to her GI doctor due to severe reflux symptoms.  She is states that she has reflux especially with carbonation and some meals.  Worsening when she lays down.  She feels heartburn.  No swallowing issues.  No shortness of breath. Did have a CT scan of the abdomen and pelvis that I have personally reviewed showing evidence of a large left diaphragmatic hernia containing stomach and spleen within the left chest. She is a Education officer, museum.  She is able to perform more than 4 METS of activity without any shortness of breath or chest pain.  Her recent CBC and CMP was unremarkable. Reports the PPI seems to improve her symptoms.  Of note she had a known history of Crohn's disease and takes daily Imuran.  She does have a known history of prior, cholecystectomy,  Laparotomy,  as well as ventral hernia repair by Dr. Jamal Collin He denies any fevers any chills any shortness of breath. No recent Ednoscpy. I have shared the images with her   + weight loss due to reflux.   Past Medical History:  Diagnosis Date  . 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 Complication of anesthesia 1995   vomiting  . Crohn's disease ([place]) 2005  . Diffuse cystic mastopathy 2013  . Herpes zoster without complication 01/10/8126  . Hypertrophic lichen planus   . Lichen simplex chronicus   . PONV (postoperative nausea and vomiting)   . Regional enteritis of unspecified site   . Renal cell carcinoma, left ([place]) 11/30/2019  . Special screening for malignant neoplasms,  colon     Past Surgical History:  Procedure Laterality Date  . bowel rescetion  07/2009  . BREAST BIOPSY Left 02-11-04   core bx,benign  . CHOLECYSTECTOMY  12/2001  . COLONOSCOPY  2011   Dr. Vira Agar  . DILATION AND CURETTAGE OF UTERUS    . hemoglobin electrophinesis    . LAPAROSCOPIC NEPHRECTOMY, HAND ASSISTED Left 11/30/2019   Procedure: HAND ASSISTED LAPAROSCOPIC NEPHRECTOMY;  Surgeon: Hollice Espy, MD;  Location: [facility] ORS;  Service: Urology;  Laterality: Left;  . LIPOMA EXCISION  2009   from back  . LIPOMA EXCISION Left 07/18/2012   arm  . TUBAL LIGATION      Family History  Problem Relation Age of Onset  . Cancer Maternal Grandfather        prostate  . Cancer Paternal Grandmother        breast  . Breast cancer Paternal Grandmother   . Breast cancer Cousin        mat cousin    Social History:  reports that she has never smoked. She has never used smokeless tobacco. She reports current alcohol use. She reports that she does not use drugs.  Allergies:  Allergies  Allergen Reactions  . Amoxicillin-Pot Clavulanate Hives  . Penicillins Hives    Did it involve swelling of the face/tongue/throat, SOB, or low BP? No Did it involve sudden or severe rash/hives, skin peeling, or any reaction on the inside of  your mouth or nose? No Did you need to seek medical attention at a hospital or doctor's office? No When did it last happen?More than 15 years ago If all above answers are "NO", may proceed with cephalosporin use.   . Sulfonamide Derivatives Hives    Medications reviewed.    ROS Full ROS performed and is otherwise negative other than what is stated in HPI   BP 132/78   Pulse 81   Temp 98.4 F (36.9 C)   Ht 5' 5"  (1.651 m)   Wt 137 lb 9.6 oz (62.4 kg)   LMP 12/26/2020 (Approximate)   SpO2 97%   BMI 22.90 kg/m   Physical Exam Vitals and nursing note reviewed. Exam conducted with a chaperone present.  Constitutional:      General: She is not in acute  distress.    Appearance: Normal appearance. She is normal weight.  Eyes:     General:        Right eye: No discharge.        Left eye: No discharge.  Cardiovascular:     Rate and Rhythm: Normal rate and regular rhythm.  Pulmonary:     Effort: Pulmonary effort is normal. No respiratory distress.     Breath sounds: Normal breath sounds. No stridor.  Abdominal:     General: Abdomen is flat. There is no distension.     Palpations: Abdomen is soft. There is no mass.     Tenderness: There is no abdominal tenderness. There is no guarding or rebound.     Hernia: No hernia is present.     Comments: Multiple scars including midline laparotomy scar  Musculoskeletal:     Cervical back: Normal range of motion and neck supple. No rigidity or tenderness.  Skin:    General: Skin is warm and dry.     Capillary Refill: Capillary refill takes less than 2 seconds.  Neurological:     General: No focal deficit present.     Mental Status: She is alert and oriented to person, place, and time.  Psychiatric:        Mood and Affect: Mood normal.        Behavior: Behavior normal.        Thought Content: Thought content normal.        Judgment: Judgment normal.       Assessment/Plan: 1. Diaphragmatic hernia without obstruction and without gangrene  55 year old female with choir left diaphragmatic hernia likely from a combination of left nephrectomy on her biopsies.  Now she does have a large defect on the diaphragm with the stomach and spleen within her left chest.  She does have symptoms We will order bariumd swallow have GI perform EGD to evaluate endoluminal anatomy. RTC after w/u is completed Total time for this encounter was 80 minutes, time includes both the face-to-face and non-face-to-face time that  the physician personally spent before, during and after the visit to include coordination of care and counseling this pt. A copy of this report sent to the referring provider  Caroleen Hamman, MD  [facility] General Surgeon

## 2021-02-13 ENCOUNTER — Other Ambulatory Visit: Payer: Self-pay

## 2021-02-13 ENCOUNTER — Other Ambulatory Visit: Payer: BC Managed Care – PPO

## 2021-02-13 ENCOUNTER — Ambulatory Visit (INDEPENDENT_AMBULATORY_CARE_PROVIDER_SITE_OTHER): Payer: BC Managed Care – PPO | Admitting: Primary Care

## 2021-02-13 VITALS — BP 118/80 | HR 68 | Temp 98.3°F | Ht 64.75 in | Wt 136.0 lb

## 2021-02-13 DIAGNOSIS — Z0000 Encounter for general adult medical examination without abnormal findings: Secondary | ICD-10-CM

## 2021-02-13 DIAGNOSIS — K50919 Crohn's disease, unspecified, with unspecified complications: Secondary | ICD-10-CM | POA: Diagnosis not present

## 2021-02-13 DIAGNOSIS — N6019 Diffuse cystic mastopathy of unspecified breast: Secondary | ICD-10-CM

## 2021-02-13 DIAGNOSIS — C642 Malignant neoplasm of left kidney, except renal pelvis: Secondary | ICD-10-CM | POA: Diagnosis not present

## 2021-02-13 DIAGNOSIS — K449 Diaphragmatic hernia without obstruction or gangrene: Secondary | ICD-10-CM | POA: Diagnosis not present

## 2021-02-13 LAB — COMPREHENSIVE METABOLIC PANEL
ALT: 10 U/L (ref 0–35)
AST: 16 U/L (ref 0–37)
Albumin: 4.4 g/dL (ref 3.5–5.2)
Alkaline Phosphatase: 51 U/L (ref 39–117)
BUN: 12 mg/dL (ref 6–23)
CO2: 29 mEq/L (ref 19–32)
Calcium: 9.3 mg/dL (ref 8.4–10.5)
Chloride: 102 mEq/L (ref 96–112)
Creatinine, Ser: 0.99 mg/dL (ref 0.40–1.20)
GFR: 64.55 mL/min (ref >60.00)
Glucose, Bld: 77 mg/dL (ref 70–99)
Potassium: 4.1 mEq/L (ref 3.5–5.1)
Sodium: 139 mEq/L (ref 135–145)
Total Bilirubin: 1 mg/dL (ref 0.2–1.2)
Total Protein: 7.2 g/dL (ref 6.0–8.3)

## 2021-02-13 LAB — LIPID PANEL
Cholesterol: 173 mg/dL (ref 0–200)
HDL: 81.5 mg/dL (ref >39.00)
LDL Cholesterol: 72 mg/dL (ref 0–99)
NonHDL: 91.3
Total CHOL/HDL Ratio: 2
Triglycerides: 96 mg/dL (ref 0.0–149.0)
VLDL: 19.2 mg/dL (ref 0.0–40.0)

## 2021-02-13 LAB — CBC
HCT: 38.4 % (ref 36.0–46.0)
Hemoglobin: 13.1 g/dL (ref 12.0–15.0)
MCHC: 34 g/dL (ref 30.0–36.0)
MCV: 93.9 fl (ref 78.0–100.0)
Platelets: 241 10*3/uL (ref 150.0–400.0)
RBC: 4.1 Mil/uL (ref 3.87–5.11)
RDW: 14.9 % (ref 11.5–15.5)
WBC: 5 10*3/uL (ref 4.0–10.5)

## 2021-02-13 NOTE — Patient Instructions (Signed)
Stop by the lab prior to leaving today. I will notify you of your results once received.   It was a pleasure to see you today!   Preventive Care 40-55 Years Old, Female Preventive care refers to lifestyle choices and visits with your health care provider that can promote health and wellness. This includes:  A yearly physical exam. This is also called an annual wellness visit.  Regular dental and eye exams.  Immunizations.  Screening for certain conditions.  Healthy lifestyle choices, such as: ? Eating a healthy diet. ? Getting regular exercise. ? Not using drugs or products that contain nicotine and tobacco. ? Limiting alcohol use. What can I expect for my preventive care visit? Physical exam Your health care provider will check your:  Height and weight. These may be used to calculate your BMI (body mass index). BMI is a measurement that tells if you are at a healthy weight.  Heart rate and blood pressure.  Body temperature.  Skin for abnormal spots. Counseling Your health care provider may ask you questions about your:  Past medical problems.  Family's medical history.  Alcohol, tobacco, and drug use.  Emotional well-being.  Home life and relationship well-being.  Sexual activity.  Diet, exercise, and sleep habits.  Work and work environment.  Access to firearms.  Method of birth control.  Menstrual cycle.  Pregnancy history. What immunizations do I need? Vaccines are usually given at various ages, according to a schedule. Your health care provider will recommend vaccines for you based on your age, medical history, and lifestyle or other factors, such as travel or where you work.   What tests do I need? Blood tests  Lipid and cholesterol levels. These may be checked every 5 years, or more often if you are 55 years old.  Hepatitis C test.  Hepatitis B test. Screening  Lung cancer screening. You may have this screening every year starting at  age 55 if you have a 30-pack-year history of smoking and currently smoke or have quit within the past 15 years.  Colorectal cancer screening. ? All adults should have this screening starting at age 50 and continuing until age 75. ? Your health care provider may recommend screening at age 45 if you are at increased risk. ? You will have tests every 1-10 years, depending on your results and the type of screening test.  Diabetes screening. ? This is done by checking your blood sugar (glucose) after you have not eaten for a while (fasting). ? You may have this done every 1-3 years.  Mammogram. ? This may be done every 1-2 years. ? Talk with your health care provider about when you should start having regular mammograms. This may depend on whether you have a family history of breast cancer.  BRCA-related cancer screening. This may be done if you have a family history of breast, ovarian, tubal, or peritoneal cancers.  Pelvic exam and Pap test. ? This may be done every 3 years starting at age 21. ? Starting at age 30, this may be done every 5 years if you have a Pap test in combination with an HPV test. Other tests  STD (sexually transmitted disease) testing, if you are at risk.  Bone density scan. This is done to screen for osteoporosis. You may have this scan if you are at high risk for osteoporosis. Talk with your health care provider about your test results, treatment options, and if necessary, the need for more tests. Follow these instructions   at home: Eating and drinking  Eat a diet that includes fresh fruits and vegetables, whole grains, lean protein, and low-fat dairy products.  Take vitamin and mineral supplements as recommended by your health care provider.  Do not drink alcohol if: ? Your health care provider tells you not to drink. ? You are pregnant, may be pregnant, or are planning to become pregnant.  If you drink alcohol: ? Limit how much you have to 0-1 drink a  day. ? Be aware of how much alcohol is in your drink. In the [place] one drink equals one 12 oz bottle of beer (355 mL), one 5 oz glass of wine (148 mL), or one 1 oz glass of hard liquor (44 mL).   Lifestyle  Take daily care of your teeth and gums. Brush your teeth every morning and night with fluoride toothpaste. Floss one time each day.  Stay active. Exercise for at least 30 minutes 5 or more days each week.  Do not use any products that contain nicotine or tobacco, such as cigarettes, e-cigarettes, and chewing tobacco. If you need help quitting, ask your health care provider.  Do not use drugs.  If you are sexually active, practice safe sex. Use a condom or other form of protection to prevent STIs (sexually transmitted infections).  If you do not wish to become pregnant, use a form of birth control. If you plan to become pregnant, see your health care provider for a prepregnancy visit.  If told by your health care provider, take low-dose aspirin daily starting at age 50.  Find healthy ways to cope with stress, such as: ? Meditation, yoga, or listening to music. ? Journaling. ? Talking to a trusted person. ? Spending time with friends and family. Safety  Always wear your seat belt while driving or riding in a vehicle.  Do not drive: ? If you have been drinking alcohol. Do not ride with someone who has been drinking. ? When you are tired or distracted. ? While texting.  Wear a helmet and other protective equipment during sports activities.  If you have firearms in your house, make sure you follow all gun safety procedures. What's next?  Visit your health care provider once a year for an annual wellness visit.  Ask your health care provider how often you should have your eyes and teeth checked.  Stay up to date on all vaccines. This information is not intended to replace advice given to you by your health care provider. Make sure you discuss any questions you have with your  health care provider. Document Revised: 05/28/2020 Document Reviewed: 05/05/2018 Elsevier Patient Education  2021 [employer] Inc.  

## 2021-02-13 NOTE — Assessment & Plan Note (Signed)
Mammogram UTD.

## 2021-02-13 NOTE — Assessment & Plan Note (Signed)
Shingrix due, she will complete this post hernia surgery later this Summer. Mammogram UTD. Pap smear UTD. Colonoscopy due next year pre patient. Follows with GI.  Exam today stable. Labs pending.

## 2021-02-13 NOTE — Assessment & Plan Note (Signed)
Follows with Urology, undergoing annual CT scans for monitoring.

## 2021-02-13 NOTE — Progress Notes (Signed)
Subjective:    Patient ID: Chelsea Hicks, female    DOB: December 03, 1965, 55 y.o.   MRN: [id number]  HPI  Chelsea Hicks is a very pleasant 55 y.o. female who presents today for complete physical.  Immunizations: -Tetanus: 2012, due today. -Influenza: Completed last season  -Covid-19: 4 vaccines -Shingles: never completed  Diet: Fair diet.  Exercise: No regular exercise.  Eye exam: Completes annually  Dental exam: Completes semi-annually   Pap Smear: 2020 Mammogram: April 2022 Colonoscopy: 2012, follows with GI.   She has lost 20 pounds in between her renal cell carcinoma and now her hiatal hernia. She is pending EGD and barium swallow study per GI. She will also undergo diaphragmatic hernia surgery in July 2022.   BP Readings from Last 3 Encounters:  02/13/21 118/80  02/05/21 132/78  01/22/21 (!) 138/92   Wt Readings from Last 3 Encounters:  02/13/21 136 lb (61.7 kg)  02/05/21 137 lb 9.6 oz (62.4 kg)  01/22/21 136 lb (61.7 kg)      Review of Systems  Constitutional:  Positive for appetite change. Negative for unexpected weight change.  HENT:  Negative for rhinorrhea.   Eyes:  Negative for visual disturbance.  Respiratory:  Negative for cough and shortness of breath.   Cardiovascular:  Negative for chest pain.  Gastrointestinal:  Positive for abdominal pain. Negative for constipation and diarrhea.  Genitourinary:  Negative for difficulty urinating.  Musculoskeletal:  Negative for arthralgias and myalgias.  Skin:  Negative for rash.  Allergic/Immunologic: Negative for environmental allergies.  Neurological:  Negative for dizziness and headaches.  Psychiatric/Behavioral:  The patient is not nervous/anxious.         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, including shoulder 4098   Complication of anesthesia 1995   vomiting   Crohn's disease ([place]) 2005   Diffuse cystic mastopathy 2013   Herpes zoster without  complication 09/07/9145   Hypertrophic lichen planus    Lichen simplex chronicus    PONV (postoperative nausea and vomiting)    Regional enteritis of unspecified site    Renal cell carcinoma, left ([place]) 11/30/2019   Special screening for malignant neoplasms, colon     Social History   Socioeconomic History   Marital status: Married    Spouse name: Not on file   Number of children: 2   Years of education: Not on file   Highest education level: Not on file  Occupational History   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[employer]  Tobacco Use   Smoking status: Never   Smokeless tobacco: Never  Substance and Sexual Activity   Alcohol use: Yes    Alcohol/week: 0.0 standard drinks    Comment: occasionally   Drug use: No   Sexual activity: Not on file  Other Topics Concern   Not on file  Social History Narrative   Attending [employer] getting Undergraduate in Early Childhood   Social Determinants of Health   Financial Resource Strain: Not on file  Food Insecurity: Not on file  Transportation Needs: Not on file  Physical Activity: Not on file  Stress: Not on file  Social Connections: Not on file  Intimate Partner Violence: Not on file    Past Surgical History:  Procedure Laterality Date   bowel rescetion  07/2009   BREAST BIOPSY Left 02-11-04   core bx,benign   CHOLECYSTECTOMY  12/2001   COLONOSCOPY  2011   Dr. Vira Agar   DILATION AND CURETTAGE OF UTERUS  hemoglobin electrophinesis     LAPAROSCOPIC NEPHRECTOMY, HAND ASSISTED Left 11/30/2019   Procedure: HAND ASSISTED LAPAROSCOPIC NEPHRECTOMY;  Surgeon: Hollice Espy, MD;  Location: [facility] ORS;  Service: Urology;  Laterality: Left;   LIPOMA EXCISION  2009   from back   LIPOMA EXCISION Left 07/18/2012   arm   TUBAL LIGATION      Family History  Problem Relation Age of Onset   Cancer Maternal Grandfather        prostate   Cancer Paternal Grandmother        breast   Breast cancer Paternal Grandmother    Breast cancer Cousin        mat  cousin    Allergies  Allergen Reactions   Amoxicillin-Pot Clavulanate Hives   Penicillins Hives    Did it involve swelling of the face/tongue/throat, SOB, or low BP? No Did it involve sudden or severe rash/hives, skin peeling, or any reaction on the inside of your mouth or nose? No Did you need to seek medical attention at a hospital or doctor's office? No When did it last happen? More than 15 years ago If all above answers are "NO", may proceed with cephalosporin use.    Sulfonamide Derivatives Hives    Current Outpatient Medications on File Prior to Visit  Medication Sig Dispense Refill   azaTHIOprine (IMURAN) 50 MG tablet Take 150 mg by mouth daily.     ferrous sulfate 325 (65 FE) MG tablet Take 325 mg by mouth daily with breakfast.     Multiple Vitamin (MULTIVITAMIN WITH MINERALS) TABS tablet Take 1 tablet by mouth daily.     pantoprazole (PROTONIX) 40 MG tablet Take by mouth.     Vitamin D3 (VITAMIN D) 25 MCG tablet Take 1,000 Units by mouth daily.     No current facility-administered medications on file prior to visit.    There were no vitals taken for this visit. Objective:   Physical Exam HENT:     Right Ear: Tympanic membrane and ear canal normal.     Left Ear: Tympanic membrane and ear canal normal.     Nose: Nose normal.  Eyes:     Conjunctiva/sclera: Conjunctivae normal.     Pupils: Pupils are equal, round, and reactive to light.  Neck:     Thyroid: No thyromegaly.  Cardiovascular:     Rate and Rhythm: Normal rate and regular rhythm.     Heart sounds: No murmur heard. Pulmonary:     Effort: Pulmonary effort is normal.     Breath sounds: Normal breath sounds. No rales.  Abdominal:     General: Bowel sounds are normal.     Palpations: Abdomen is soft.     Tenderness: There is no abdominal tenderness.  Musculoskeletal:        General: Normal range of motion.     Cervical back: Neck supple.  Lymphadenopathy:     Cervical: No cervical adenopathy.  Skin:     General: Skin is warm and dry.     Findings: No rash.  Neurological:     Mental Status: She is alert and oriented to person, place, and time.     Cranial Nerves: No cranial nerve deficit.     Deep Tendon Reflexes: Reflexes are normal and symmetric.  Psychiatric:        Mood and Affect: Mood normal.          Assessment & Plan:      This visit occurred during the SARS-CoV-2 public health  emergency.  Safety protocols were in place, including screening questions prior to the visit, additional usage of staff PPE, and extensive cleaning of exam room while observing appropriate contact time as indicated for disinfecting solutions.

## 2021-02-13 NOTE — Assessment & Plan Note (Signed)
Overall doing well, continue Imuran 150 mg as prescribed. Following with GI.

## 2021-02-13 NOTE — Assessment & Plan Note (Signed)
Following with general surgery and GI, pending surgery for July 2022. Continue pantoprazole 40 mg.

## 2021-02-14 ENCOUNTER — Other Ambulatory Visit: Payer: BC Managed Care – PPO

## 2021-02-19 ENCOUNTER — Other Ambulatory Visit: Payer: Self-pay

## 2021-02-19 ENCOUNTER — Ambulatory Visit
Admission: RE | Admit: 2021-02-19 | Discharge: 2021-02-19 | Disposition: A | Discharge status: Home / Self Care | Payer: BC Managed Care – PPO | Source: Ambulatory Visit | Attending: Surgery | Admitting: Surgery

## 2021-02-19 DIAGNOSIS — K449 Diaphragmatic hernia without obstruction or gangrene: Secondary | ICD-10-CM | POA: Insufficient documentation

## 2021-02-19 IMAGING — RF DG ESOPHAGUS
9 series · 14 of 24 positions shown · non-contrast
Comparison: CT scan 01/15/2021.

CLINICAL DATA: Diaphragmatic hernia.

EXAM:
ESOPHOGRAM/BARIUM SWALLOW
TECHNIQUE: Single contrast examination was performed using  50 barium.
FLUOROSCOPY TIME:  Fluoroscopy Time:  2 minutes and 24 seconds.
Radiation Exposure Index (if provided by the fluoroscopic device):
13.9 mGy
Number of Acquired Spot Images:

[Series 1: fluoro_barium 2fps_bw · 0.17mm/px · 1 of 7 frames shown (1 of 4)]
[frame 2/7]
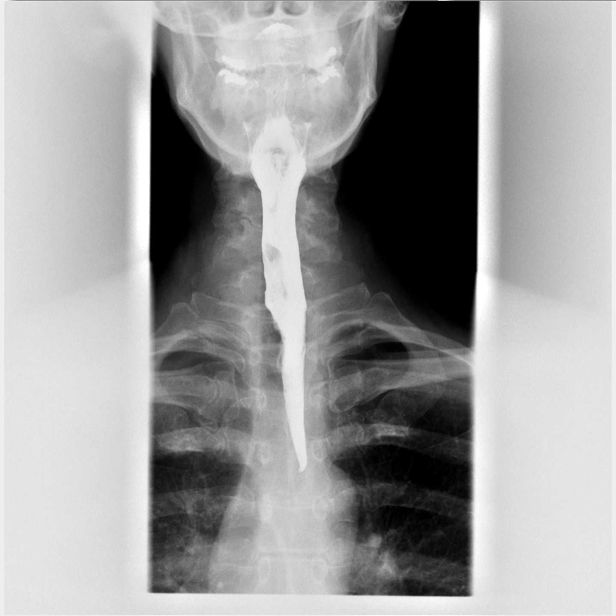

[Series 2: fluoro_barium 2fps_bw · 0.18mm/px · 2 of 8 frames shown (2 of 4)]
[frame 2/8]
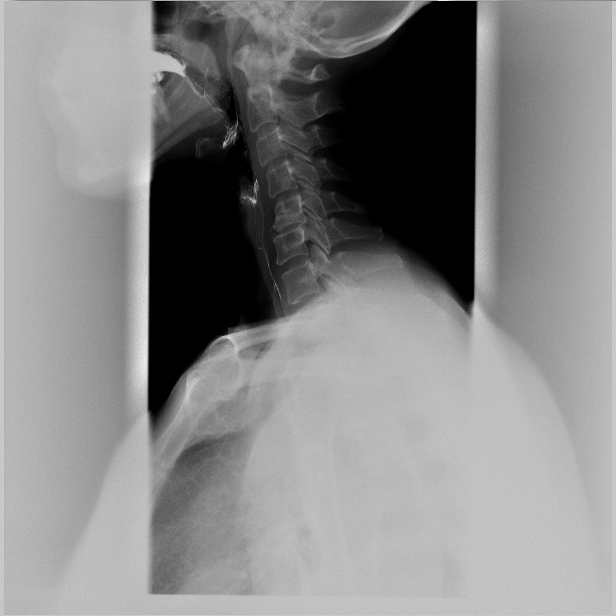
[frame 7/8]
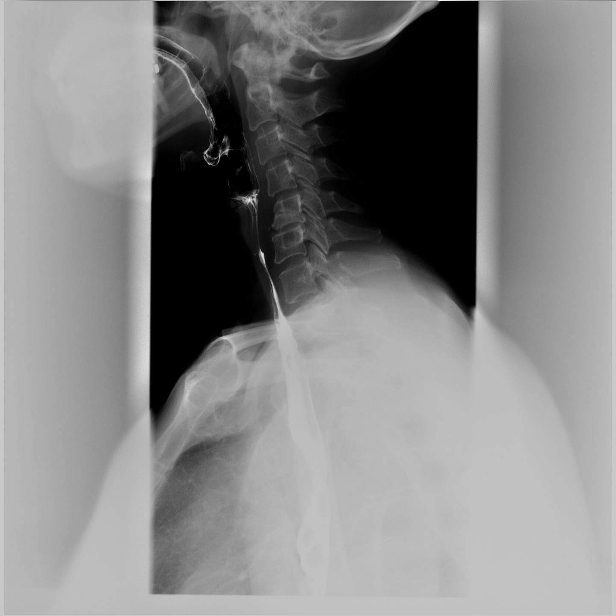

[Series 3: fluoro_barium 2fps_bw · 0.18mm/px · 1 of 2 frames shown (3 of 4)]
[frame 2/2]
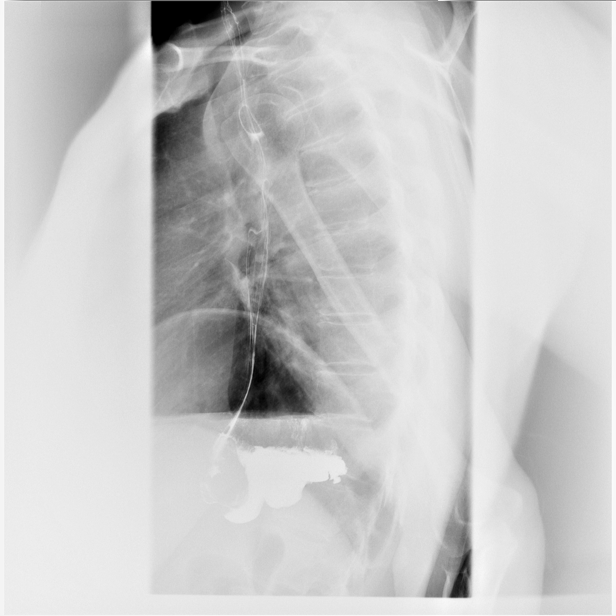

[Series 4: fluoro_barium 2fps_bw · 0.17mm/px · 2 of 25 frames shown (4 of 4)]
[frame 4/25]
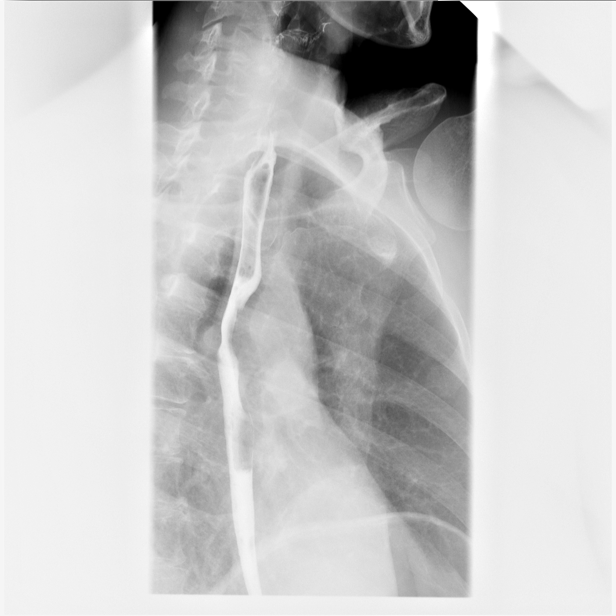
[frame 22/25]
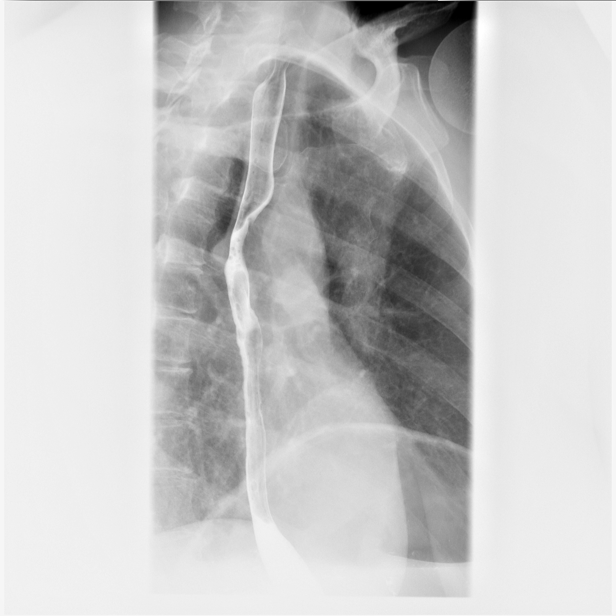

[Series 5: cp_standard · 0.26mm/px · 2 of 42 frames shown (1 of 5)]
[frame 22/42]
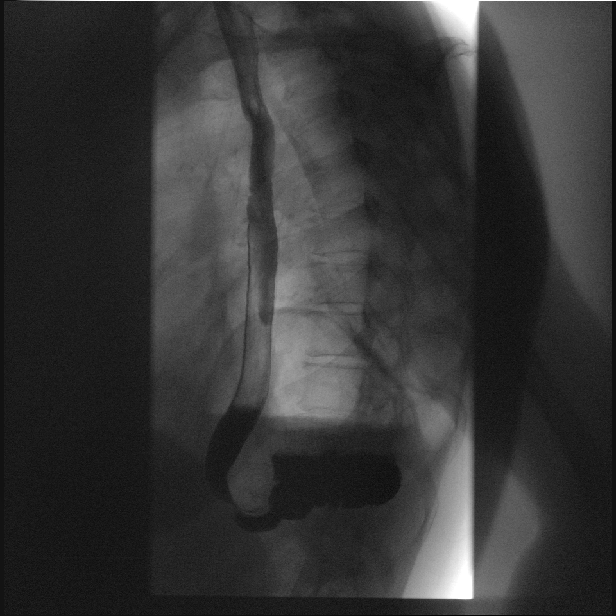
[frame 25/42]
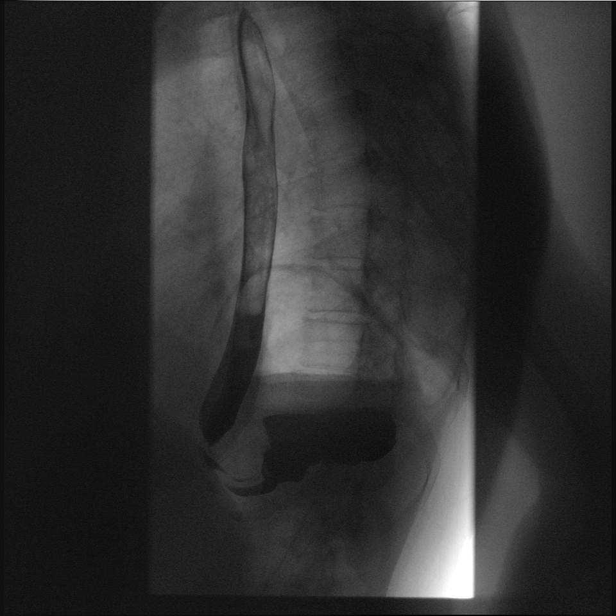

[Series 6: cp_standard · 0.29mm/px · 2 of 35 frames shown (2 of 5)]
[frame 11/35]
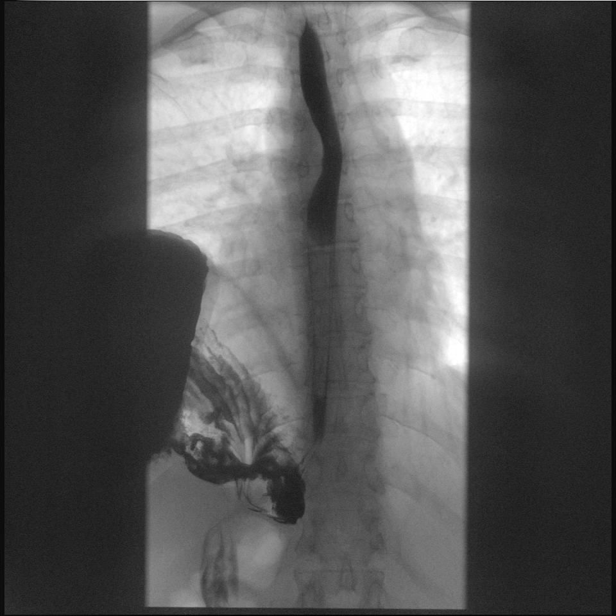
[frame 30/35]
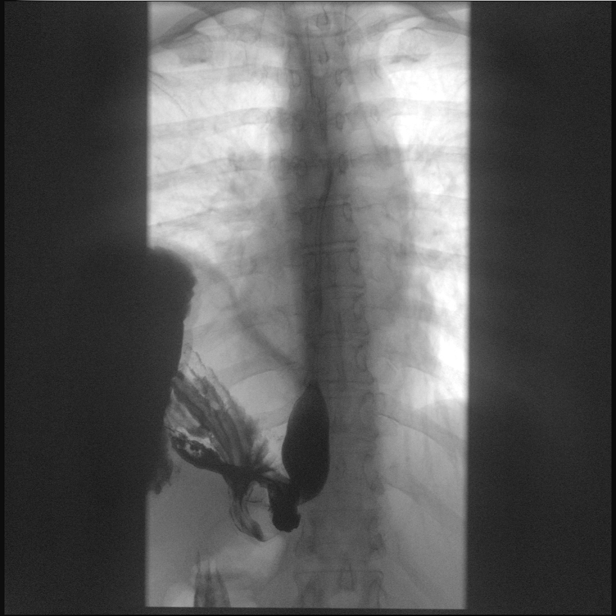

[Series 7: cp_standard · 0.29mm/px · 2 of 37 frames shown (3 of 5)]
[frame 32/37]
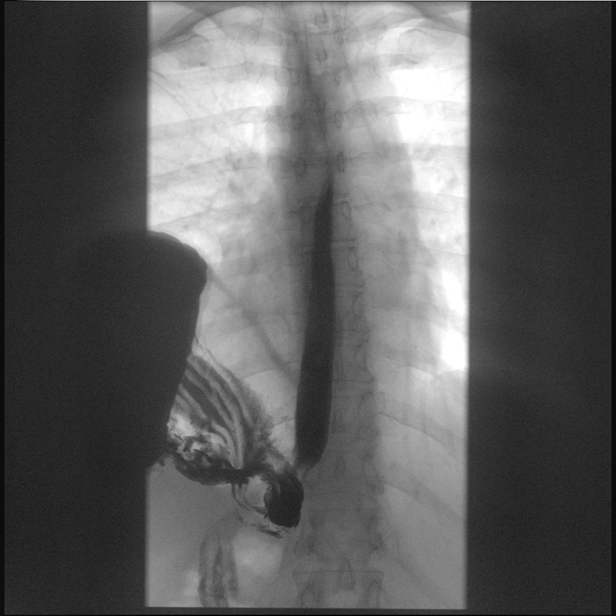
[frame 37/37  full-range]
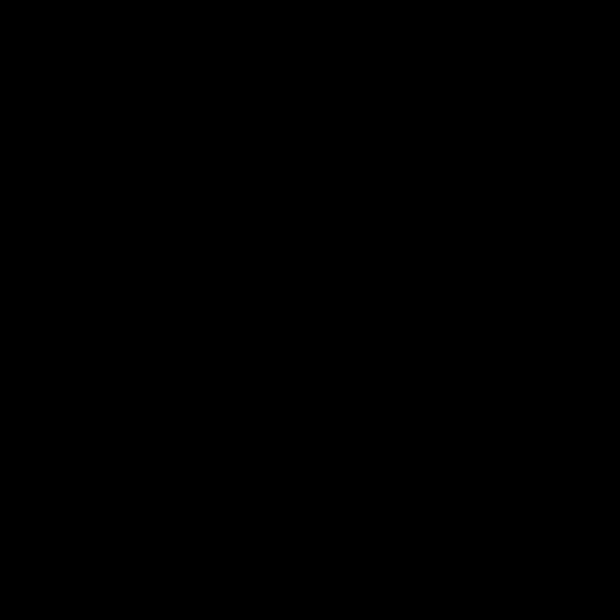

[Series 8: cp_standard · 0.29mm/px · 1 of 33 frames shown (4 of 5)]
[frame 5/33]
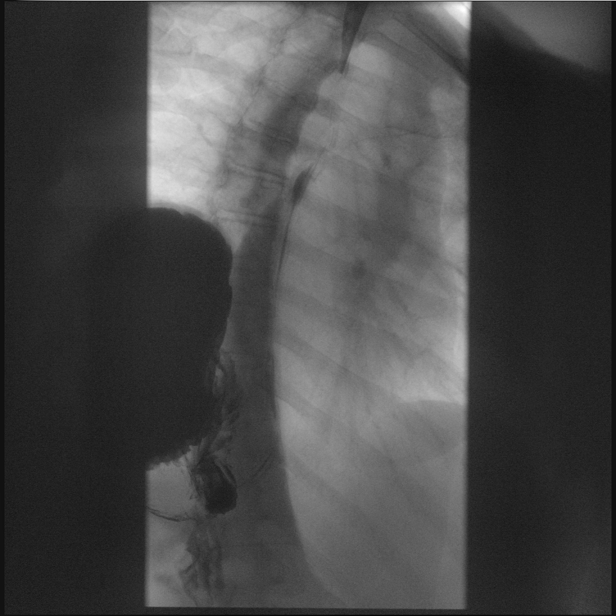

[Series 9: cp_standard · 0.29mm/px · 1 of 1 slices shown (5 of 5)]
[im 1/1]
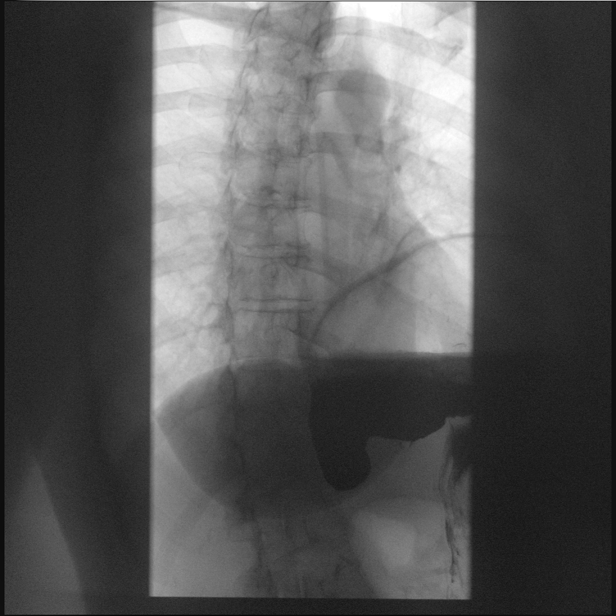

[14 of 24 positions shown; findings below may reference images not displayed]

FINDINGS: Frontal and lateral views of the hypopharynx while swallowing are
unremarkable.

Due to the degree of gastric distension prior to the exam, double
contrast imaging was not performed. Single contrast imaging of the
esophagus shows no evidence for mass lesion, stricture, or gross
mucosal ulceration. Esophagogastric junction appears to be in the
appropriate anatomic location and loops down and the back up in
posteriorly into the esophagogastric junction. Fluoroscopic
appearance is consistent with the appearance on recent CT imaging
where the distal esophagus loops under a small portion of the
gastric cardia.

Assessment of esophageal motility shows preservation of the primary
peristaltic stripping wave without tertiary contractions or
presbyesophagus.

13 mm barium tablet passes readily into the stomach when taken with
water.
IMPRESSION: 1. Unremarkable single contrast barium esophagram.
2. Stomach is displaced cranially into the lower left hemithorax via
the patient's known hemidiaphragmatic hernia.

## 2021-02-20 ENCOUNTER — Telehealth: Payer: Self-pay | Admitting: *Deleted

## 2021-02-20 ENCOUNTER — Telehealth: Payer: Self-pay

## 2021-02-20 NOTE — Telephone Encounter (Signed)
Left a message for the patient letting her know that only her hernia showed on the Barium swallow study and no other issues per Dr Dahlia Byes. She will follow up as scheduled.

## 2021-02-20 NOTE — Telephone Encounter (Signed)
Patient called back and is aware of results and to follow up as scheduled

## 2021-02-20 NOTE — Telephone Encounter (Signed)
-----   Message from Jules Husbands, MD sent at 02/19/2021  9:03 PM EDT ----- Please let her know the swallow study showed the hernia but no other surprises. Keep\p appt w me ----- Message ----- From: Interface, Rad Results In Sent: 02/19/2021  11:36 AM EDT To: Jules Husbands, MD

## 2021-03-03 ENCOUNTER — Other Ambulatory Visit: Payer: Self-pay

## 2021-03-03 ENCOUNTER — Ambulatory Visit: Payer: BC Managed Care – PPO | Admitting: Surgery

## 2021-03-03 ENCOUNTER — Encounter: Payer: Self-pay | Admitting: Surgery

## 2021-03-03 ENCOUNTER — Telehealth: Payer: Self-pay | Admitting: Surgery

## 2021-03-03 VITALS — BP 131/81 | HR 82 | Temp 98.2°F | Ht 64.75 in | Wt 133.4 lb

## 2021-03-03 DIAGNOSIS — K449 Diaphragmatic hernia without obstruction or gangrene: Secondary | ICD-10-CM | POA: Diagnosis not present

## 2021-03-03 NOTE — Telephone Encounter (Signed)
Patient has been advised of Pre-Admission date/time, COVID Testing date and Surgery date.  Surgery Date: 03/11/21 Preadmission Testing Date: 03/05/21 (phone 8a-1p) Covid Testing Date: 03/07/21 @ 8:25 am - patient advised to go to [facility] between 8a-1p  Patient has been made aware to call [phone], between 1-3:00pm the day before surgery, to find out what time to arrive for surgery.

## 2021-03-03 NOTE — Progress Notes (Signed)
Outpatient Surgical Follow Up  03/03/2021  Niajah S Daffron is an 55 y.o. female.   Chief Complaint  Patient presents with   Follow-up    Diaphragmatic hernia    HPI: 55 year old female well-known to me with a history of left diaphragmatic hernia after left nephrectomy and multiple biopsies.  She does have symptoms related to this to include some reflux as well as some pains and shortness of breath.  She underwent a recent endoscopy that I personally reviewed , unable to reach the duodenum due to the anatomy.  There was evidence of reflux and changes in the GE junction.  There is no evidence of intraluminal lesions.  I have also reviewed the CT scan again personally showing evidence of left diaphragmatic defect with stomach and spleen within the left chest. 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, including shoulder 2751   Complication of anesthesia 1995   vomiting   Crohn's disease ([place]) 2005   Diffuse cystic mastopathy 2013   Herpes zoster without complication 7/0/0174   Hypertrophic lichen planus    Lichen simplex chronicus    PONV (postoperative nausea and vomiting)    Regional enteritis of unspecified site    Renal cell carcinoma, left ([place]) 11/30/2019   Special screening for malignant neoplasms, colon     Past Surgical History:  Procedure Laterality Date   bowel rescetion  07/2009   BREAST BIOPSY Left 02-11-04   core bx,benign   CHOLECYSTECTOMY  12/2001   COLONOSCOPY  2011   Dr. Vira Agar   DILATION AND CURETTAGE OF UTERUS     hemoglobin electrophinesis     LAPAROSCOPIC NEPHRECTOMY, HAND ASSISTED Left 11/30/2019   Procedure: HAND ASSISTED LAPAROSCOPIC NEPHRECTOMY;  Surgeon: Hollice Espy, MD;  Location: [facility] ORS;  Service: Urology;  Laterality: Left;   LIPOMA EXCISION  2009   from back   LIPOMA EXCISION Left 07/18/2012   arm   TUBAL LIGATION      Family  History  Problem Relation Age of Onset   Cancer Maternal Grandfather        prostate   Cancer Paternal Grandmother        breast   Breast cancer Paternal Grandmother    Breast cancer Cousin        mat cousin    Social History:  reports that she has never smoked. She has never used smokeless tobacco. She reports current alcohol use. She reports that she does not use drugs.  Allergies:  Allergies  Allergen Reactions   Amoxicillin-Pot Clavulanate Hives   Penicillins Hives    Did it involve swelling of the face/tongue/throat, SOB, or low BP? No Did it involve sudden or severe rash/hives, skin peeling, or any reaction on the inside of your mouth or nose? No Did you need to seek medical attention at a hospital or doctor's office? No When did it last happen? More than 15 years ago If all above answers are "NO", may proceed with cephalosporin use.    Sulfonamide Derivatives Hives    Medications reviewed.    ROS Full ROS performed and is otherwise negative other than what is stated in HPI   BP 131/81   Pulse 82   Temp 98.2 F (36.8 C) (Oral)   Ht 5' 4.75" (1.645 m)   Wt 133 lb 6.4 oz (60.5 kg)   SpO2 98%   BMI 22.37 kg/m  Physical Exam Vitals and nursing note reviewed. Exam conducted with a chaperone present.  Constitutional:      General: She is not in acute distress.    Appearance: Normal appearance. She is normal weight.  Cardiovascular:     Rate and Rhythm: Normal rate and regular rhythm.     Heart sounds: No murmur heard. Pulmonary:     Effort: Pulmonary effort is normal. No respiratory distress.     Breath sounds: Normal breath sounds. No stridor. No rhonchi.  Abdominal:     General: Abdomen is flat. There is no distension.     Palpations: Abdomen is soft. There is no mass.     Tenderness: There is no abdominal tenderness. There is no guarding or rebound.     Hernia: No hernia is present.  Musculoskeletal:        General: Normal range of motion.      Cervical back: Normal range of motion and neck supple. No rigidity.  Skin:    General: Skin is warm and dry.     Capillary Refill: Capillary refill takes less than 2 seconds.  Neurological:     General: No focal deficit present.     Mental Status: She is alert and oriented to person, place, and time.  Psychiatric:        Mood and Affect: Mood normal.        Behavior: Behavior normal.        Thought Content: Thought content normal.        Judgment: Judgment normal.       Assessment/Plan: 55 year old female and left diaphragmatic hernia that is symptomatic and is fair.  Procedure discussed with patient in detail.  I do think she is a candidate for robotic approach. Procedure discussed with the patient in detail.  Risks, benefits and possible complications including but not limited to: Bleeding, structures including esophagus, SB and pleura. She understands and wishes to proceed.  Depending on operative findings she may need a fundoplication. Extensive counseling provided   Greater than 50% of the 40 minutes  visit was spent in counseling/coordination of care   Caroleen Hamman, MD [place] Surgeon

## 2021-03-03 NOTE — H&P (View-Only) (Signed)
Outpatient Surgical Follow Up  03/03/2021  Chelsea Hicks is an 55 y.o. female.   Chief Complaint  Patient presents with   Follow-up    Diaphragmatic hernia    HPI: 55 year old female well-known to me with a history of left diaphragmatic hernia after left nephrectomy and multiple biopsies.  She does have symptoms related to this to include some reflux as well as some pains and shortness of breath.  She underwent a recent endoscopy that I personally reviewed , unable to reach the duodenum due to the anatomy.  There was evidence of reflux and changes in the GE junction.  There is no evidence of intraluminal lesions.  I have also reviewed the CT scan again personally showing evidence of left diaphragmatic defect with stomach and spleen within the left chest. 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, including shoulder 9892   Complication of anesthesia 1995   vomiting   Crohn's disease ([place]) 2005   Diffuse cystic mastopathy 2013   Herpes zoster without complication 09/08/9415   Hypertrophic lichen planus    Lichen simplex chronicus    PONV (postoperative nausea and vomiting)    Regional enteritis of unspecified site    Renal cell carcinoma, left ([place]) 11/30/2019   Special screening for malignant neoplasms, colon     Past Surgical History:  Procedure Laterality Date   bowel rescetion  07/2009   BREAST BIOPSY Left 02-11-04   core bx,benign   CHOLECYSTECTOMY  12/2001   COLONOSCOPY  2011   Dr. Vira Agar   DILATION AND CURETTAGE OF UTERUS     hemoglobin electrophinesis     LAPAROSCOPIC NEPHRECTOMY, HAND ASSISTED Left 11/30/2019   Procedure: HAND ASSISTED LAPAROSCOPIC NEPHRECTOMY;  Surgeon: Hollice Espy, MD;  Location: [facility] ORS;  Service: Urology;  Laterality: Left;   LIPOMA EXCISION  2009   from back   LIPOMA EXCISION Left 07/18/2012   arm   TUBAL LIGATION      Family  History  Problem Relation Age of Onset   Cancer Maternal Grandfather        prostate   Cancer Paternal Grandmother        breast   Breast cancer Paternal Grandmother    Breast cancer Cousin        mat cousin    Social History:  reports that she has never smoked. She has never used smokeless tobacco. She reports current alcohol use. She reports that she does not use drugs.  Allergies:  Allergies  Allergen Reactions   Amoxicillin-Pot Clavulanate Hives   Penicillins Hives    Did it involve swelling of the face/tongue/throat, SOB, or low BP? No Did it involve sudden or severe rash/hives, skin peeling, or any reaction on the inside of your mouth or nose? No Did you need to seek medical attention at a hospital or doctor's office? No When did it last happen? More than 15 years ago If all above answers are "NO", may proceed with cephalosporin use.    Sulfonamide Derivatives Hives    Medications reviewed.    ROS Full ROS performed and is otherwise negative other than what is stated in HPI   BP 131/81   Pulse 82   Temp 98.2 F (36.8 C) (Oral)   Ht 5' 4.75" (1.645 m)   Wt 133 lb 6.4 oz (60.5 kg)   SpO2 98%   BMI 22.37 kg/m  Physical Exam Vitals and nursing note reviewed. Exam conducted with a chaperone present.  Constitutional:      General: She is not in acute distress.    Appearance: Normal appearance. She is normal weight.  Cardiovascular:     Rate and Rhythm: Normal rate and regular rhythm.     Heart sounds: No murmur heard. Pulmonary:     Effort: Pulmonary effort is normal. No respiratory distress.     Breath sounds: Normal breath sounds. No stridor. No rhonchi.  Abdominal:     General: Abdomen is flat. There is no distension.     Palpations: Abdomen is soft. There is no mass.     Tenderness: There is no abdominal tenderness. There is no guarding or rebound.     Hernia: No hernia is present.  Musculoskeletal:        General: Normal range of motion.      Cervical back: Normal range of motion and neck supple. No rigidity.  Skin:    General: Skin is warm and dry.     Capillary Refill: Capillary refill takes less than 2 seconds.  Neurological:     General: No focal deficit present.     Mental Status: She is alert and oriented to person, place, and time.  Psychiatric:        Mood and Affect: Mood normal.        Behavior: Behavior normal.        Thought Content: Thought content normal.        Judgment: Judgment normal.       Assessment/Plan: 55 year old female and left diaphragmatic hernia that is symptomatic and is fair.  Procedure discussed with patient in detail.  I do think she is a candidate for robotic approach. Procedure discussed with the patient in detail.  Risks, benefits and possible complications including but not limited to: Bleeding, structures including esophagus, SB and pleura. She understands and wishes to proceed.  Depending on operative findings she may need a fundoplication. Extensive counseling provided   Greater than 50% of the 40 minutes  visit was spent in counseling/coordination of care   Caroleen Hamman, MD [place] Surgeon

## 2021-03-03 NOTE — Patient Instructions (Addendum)
Our surgery scheduler will call you within 24-48 hours to schedule your surgery. Please have the [place] surgery sheet available when speaking with her.     Eating Plan After Nissen Fundoplication After a Nissen fundoplication procedure, it is common to have some difficulty swallowing. The part of your body that moves food and liquid from your mouth to your stomach (esophagus) will be swollen and may feel tight. It will take several weeks or months foryour esophagus and stomach to heal. By following a special eating plan, you can prevent problems such as pain, swelling or pressure in the abdomen (bloating), gas, nausea, or diarrhea. What are tips for following this plan? Cooking Cook all foods until they are soft. Remove skins and seeds from fruits and vegetables before eating. Remove skin and gristle from meats. Grind or finely mince meats before eating. Avoid over-cooking meat. Dry, tough meat is more difficult to swallow. Avoid using oil when cooking, or use only a small amount of oil. Avoid using seasoning when cooking, or use only a small amount of seasoning. Toast bread before eating. This makes it easier to swallow. Meal planning  Eat 6-8 small meals throughout the day. Right after the surgery, have a few meals that are only clear liquids. Clear liquids include: Water. Clear fruit juice, no pulp. Chicken, beef, or vegetable broth. Gelatin. Decaffeinated tea or coffee without milk. Popsicles or shaved ice. Depending on your progress, you may move to a full liquid diet as told by your health care provider. This includes clear liquids and the following: Dairy and alternative milks, such as soy milk. Strained creamed soups. Ice cream or sherbet. Pudding. Nutritional supplement drinks. Yogurt. A few days after surgery, you may be able to start eating a diet of soft foods. You may need to eat according to this plan for several weeks. Do not eat sweets or sweetened drinks at the  beginning of a meal. Doing that may cause your stomach to empty faster than it should (dumping syndrome).  Lifestyle Always sit upright when eating or drinking. Eat slowly. Take small bites and chew food well before swallowing. Do not lie down after eating. Stay sitting up for 30 minutes or longer after each meal. Sip fluids between meals. Limit how much you drink at one time. With meals and snacks, have 4-8 oz (120-240 mL). This is equal to  cup-1 cup. Do not mix solid foods and liquids in the same mouthful. Drink enough fluid to keep your urine pale yellow. Do not chew gum or drink fluids through a straw. Doing those things may cause you to swallow extra air. General information Do not drink carbonated drinks or alcohol. Avoid foods and drinks that contain caffeine and chocolate. Avoid foods and drinks that contain citrus or tomato. Allow hot soups and drinks to cool before eating. Avoid foods that cause gas, such as beans, peas, broccoli, or cabbage. If dairy milk products cause diarrhea, avoid them or eat them in small amounts. Recommended foods Fruits Any soft-cooked fruits after skins and seeds are removed. Fruit juice. Vegetables Any soft-cooked vegetables after skins and seeds are removed. Vegetable juice. Grains Cooked cereals. Dry cereals softened with liquid. Cooked pasta, rice, or other grains. Toasted bread. Bland crackers, such as soda or graham crackers. Meats and other protein foods Tender cuts of meat, poultry, or fish after bones, skin, and gristle are removed. Poached, boiled, or scrambled eggs. Canned fish. Tofu. Creamy nut butters. Dairy Milk. Yogurt. Cottage cheese. Mild cheeses. Beverages Nutritional supplement  drinks. Decaffeinated tea or coffee. Sports drinks. Fats and oils Butter. Margarine. Mayonnaise. Vegetable oil. Smooth salad dressing. Sweets and desserts Plain hard candy. Marshmallows. Pudding. Ice cream. Gelatin. Sherbet. Seasoning and other  foods Salt. Light seasonings. Mustard. Vinegar. The items listed above may not be a complete list of recommended foods and beverages. Contact a dietitian for more information. Foods to avoid Fruits Oranges. Grapefruit. Lemons. Limes. Citrus juices. Dried fruit. Crunchy, rawfruits. Vegetables Tomato sauce. Tomato juice. Broccoli. Cauliflower. Cabbage. Brussels sprouts.Crunchy, raw vegetables. Grains High-fiber or bran cereal. Cereal with nuts, dried fruit, or coconut. Sweet breads, rolls, coffee cake, or donuts. Chewy or crusty breads. Popcorn. Meats and other protein foods Beans, peas, and lentils. Tough or fatty meats. Fried meats, chicken, or fish. Fried eggs. Nuts and seeds. Crunchy nut butters. Dairy Chocolate milk. Yogurt with chunks of fruit, nuts, seeds, or coconut. Strong cheeses. Beverages Carbonated soft drinks. Alcohol. Cocoa. Hot drinks. Fats and oils Bacon fat. Lard. Sweets and desserts Chocolate. Candy with nuts, coconut, or seeds. Peppermint. Cookies. Cakes. Pie crust. Seasoning and other foods Heavy seasonings. Chili sauce. Ketchup. Barbecue sauce. Angie Fava. Horseradish. The items listed above may not be a complete list of foods and beverages to avoid. Contact a dietitian for more information. Summary Following this eating plan after a Nissen fundoplication is an important part of healing after surgery. After surgery, you will start with a clear liquid diet before you progress to full liquids and soft foods. You may need to eat soft foods for several weeks. Avoid eating foods that cause irritation, gas, nausea, diarrhea, or swelling or pressure in the abdomen (bloating), and avoid foods that are difficult to swallow. Talk with a dietitian about which dietary choices are best for you. This information is not intended to replace advice given to you by your health care provider. Make sure you discuss any questions you have with your healthcare provider. Document Revised:  03/10/2020 Document Reviewed: 03/10/2020 Elsevier Patient Education  [place].

## 2021-03-03 NOTE — Progress Notes (Signed)
nissen

## 2021-03-05 ENCOUNTER — Other Ambulatory Visit
Admission: RE | Admit: 2021-03-05 | Discharge: 2021-03-05 | Disposition: A | Discharge status: Home / Self Care | Payer: BC Managed Care – PPO | Source: Ambulatory Visit | Attending: Surgery | Admitting: Surgery

## 2021-03-05 ENCOUNTER — Other Ambulatory Visit: Payer: Self-pay

## 2021-03-05 NOTE — Patient Instructions (Signed)
Chelsea Hicks [id number] 03/05/21                    Your procedure is scheduled on: Tuesday March 11, 2021. Report to Day Surgery inside [facility] 2nd floor (stop by admissions desk first before getting on elevator). To find out your arrival time please call [phone] between 1PM - 3PM on Friday March 07, 2021.  Remember: Instructions that are not followed completely may result in serious medical risk,  up to and including death, or upon the discretion of your surgeon and anesthesiologist your  surgery may need to be rescheduled.     _X__ 1. Do not eat food after midnight the night before your procedure.                 No chewing gum or hard candies. You may drink clear liquids up to 2 hours                 before you are scheduled to arrive for your surgery- DO not drink clear                 liquids within 2 hours of the start of your surgery.                 Clear Liquids include:  water, apple juice without pulp, clear Gatorade, G2 or                  Gatorade Zero (avoid Red/Purple/Blue), Black Coffee or Tea (Do not add                 anything to coffee or tea).  __X__2.  On the morning of surgery brush your teeth with toothpaste and water, you                may rinse your mouth with mouthwash if you wish.  Do not swallow any toothpaste of mouthwash.     _X__ 3.  No Alcohol for 24 hours before or after surgery.   _X__ 4.  Do Not Smoke or use e-cigarettes For 24 Hours Prior to Your Surgery.                 Do not use any chewable tobacco products for at least 6 hours prior to                 Surgery.  _X__  5.  Do not use any recreational drugs (marijuana, cocaine, heroin, ecstasy, MDMA or other)                For at least one week prior to your surgery.  Combination of these drugs with anesthesia                May have life threatening results.  __X__6.  Notify your doctor if there is any change in your medical condition      (cold, fever, infections).     Do  not wear jewelry, make-up, hairpins, clips or nail polish. Do not wear lotions, powders, or perfumes. You may wear deodorant. Do not shave 48 hours prior to surgery. Men may shave face and neck. Do not bring valuables to the hospital.    [facility] is not responsible for any belongings or valuables.  Contacts, dentures or bridgework may not be worn into surgery. Leave your suitcase in the car. After surgery it may be brought to your room. For patients admitted to the  hospital, discharge time is determined by your treatment team.   Patients discharged the day of surgery will not be allowed to drive home.   Make arrangements for someone to be with you for the first 24 hours of your Same Day Discharge.    __X__ Take these medicines the morning of surgery with A SIP OF WATER:    1. azaTHIOprine (IMURAN) 50 MG  2. pantoprazole (PROTONIX) 40 MG  3.   4.  5.  6.  ____ Fleet Enema (as directed)   __X__ Use CHG Soap (or wipes) as directed  ____ Use Benzoyl Peroxide Gel as instructed  ____ Use inhalers on the day of surgery  ____ Stop metformin 2 days prior to surgery    ____ Take 1/2 of usual insulin dose the night before surgery. No insulin the morning          of surgery.   ____ Call your PCP, cardiologist, or Pulmonologist if taking Coumadin/Plavix/aspirin and ask when to stop before your surgery.   __X__ One Week prior to surgery- Stop Anti-inflammatories such as Ibuprofen, Aleve, Advil, Motrin, meloxicam (MOBIC), diclofenac, etodolac, ketorolac, Toradol, Daypro, piroxicam, Goody's or BC powders. OK TO USE TYLENOL IF NEEDED   __X__ Stop all your supplements until after surgery. ferrous sulfate, Multiple Vitamin and Vitamin D3.  ____ Bring C-Pap to the hospital.    If you have any questions regarding your pre-procedure instructions,  Please call Pre-admit Testing at [phone].

## 2021-03-05 NOTE — Progress Notes (Signed)
  Perioperative Services Pre-Admission/Anesthesia Testing   Date: 03/05/21 Name: Chelsea Hicks MRN:   [id number]  Re: Consideration of preoperative prophylactic antibiotic change   Request sent to: Jules Husbands, MD (routed and/or faxed via [facility])  Planned Surgical Procedure(s):    Case: [id number] Date/Time: 03/11/21 1215   Procedure: XI ROBOTIC ASSISTED PARAESOPHAGEAL HERNIA REPAIR, Otho Ket, PA-C to assist   Anesthesia type: General   Pre-op diagnosis: paraesophageal hernia   Location: [facility] OR ROOM 07 / [place] [facility]S FOR ANESTHESIA GROUP   Surgeons: Jules Husbands, MD     Notes: Patient has a documented allergy to PCN  Advising that PCN has caused her to experience urticarial rash in the past.   Screened as appropriate for cephalosporin use during medication reconciliation No immediate angioedema, dysphagia, SOB, anaphylaxis symptoms. No severe rash involving mucous membranes or skin necrosis. No hospital admissions related to side effects of PCN/cephalosporin use.  No documented reaction to PCN or cephalosporin in the last 10 years.  Request:  As an evidence based approach to reducing the rate of incidence for post-operative SSI and the development of MDROs, could an agent with narrower coverage for preoperative prophylaxis in this patient's upcoming surgical course be considered?   Currently ordered preoperative prophylactic ABX: clindamycin.   Specifically requesting change to cephalosporin (CEFAZOLIN).   Please communicate decision with me and I will change the orders in Epic as per your direction.   Things to consider: Many patients report that they were "allergic" to PCN earlier in life, however this does not translate into a true lifelong allergy. Patients can lose sensitivity to specific IgE antibodies over time if PCN is avoided (Kleris & Lugar, 2019).  Up to 10% of the adult population and 15% of hospitalized patients report an allergy to PCN, however clinical  studies suggest that 90% of those reporting an allergy can tolerate PCN antibiotics (Kleris & Lugar, 2019).  Cross-sensitivity between PCN and cephalosporins has been documented as being as high as 10%, however this estimation included data believed to have been collected in a setting where there was contamination. Newer data suggests that the prevalence of cross-sensitivity between PCN and cephalosporins is actually estimated to be closer to 1% (Hermanides et al., 2018).   Patients labeled as PCN allergic, whether they are truly allergic or not, have been found to have inferior outcomes in terms of rates of serious infection, and these patients tend to have longer hospital stays ([place], 2019).  Treatment related secondary infections, such as Clostridioides difficile, have been linked to the improper use of broad spectrum antibiotics in patients improperly labeled as PCN allergic (Kleris & Lugar, 2019).  Anaphylaxis from cephalosporins is rare and the evidence suggests that there is no increased risk of an anaphylactic type reaction when cephalosporins are used in a PCN allergic patient (Pichichero, 2006).  Citations: Hermanides J, Lemkes BA, Prins Pearla Dubonnet MW, Terreehorst I. Presumed ?-Lactam Allergy and Cross-reactivity in the Operating Theater: A Practical Approach. Anesthesiology. 2018 Aug;129(2):335-342. doi: 10.1097/ALN.0000000000002252. PMID: 33354562.  Kleris, [place]., & Lugar, P. L. (2019). Things We Do For No Reason: Failing to Question a Penicillin Allergy History. Journal of hospital medicine, 14(10), 7813282661. Advance online publication. https://www.wallace-middleton.info/  Pichichero, M. E. (2006). Cephalosporins can be prescribed safely for penicillin-allergic patients. Journal of family medicine, 55(2), 106-112. Accessed: https://cdn.mdedge.com/files/s68f-public/Document/September-2017/5502JFP_AppliedEvidence1.pdf   BHonor Loh MSN, APRN, FNP-C, CEN [facility]  Peri-operative Services Nurse Practitioner FAX: [phone]/29/22 5:11 PM

## 2021-03-07 ENCOUNTER — Other Ambulatory Visit
Admission: RE | Admit: 2021-03-07 | Discharge: 2021-03-07 | Disposition: A | Discharge status: Home / Self Care | Payer: BC Managed Care – PPO | Source: Ambulatory Visit | Attending: Surgery | Admitting: Surgery

## 2021-03-07 ENCOUNTER — Other Ambulatory Visit: Payer: Self-pay

## 2021-03-07 DIAGNOSIS — Z01812 Encounter for preprocedural laboratory examination: Secondary | ICD-10-CM | POA: Insufficient documentation

## 2021-03-07 DIAGNOSIS — Z20822 Contact with and (suspected) exposure to covid-19: Secondary | ICD-10-CM | POA: Diagnosis not present

## 2021-03-07 LAB — SARS CORONAVIRUS 2 (TAT 6-24 HRS): SARS Coronavirus 2: NEGATIVE

## 2021-03-10 MED ORDER — CHLORHEXIDINE GLUCONATE 0.12 % MT SOLN: 15.0000 mL | Freq: Once | OROMUCOSAL | Status: AC

## 2021-03-10 MED ORDER — ORAL CARE MOUTH RINSE: 15.0000 mL | Freq: Once | OROMUCOSAL | Status: AC

## 2021-03-10 MED ORDER — APREPITANT 40 MG PO CAPS: 40.0000 mg | ORAL_CAPSULE | Freq: Once | ORAL | Status: AC

## 2021-03-10 MED ORDER — SODIUM CHLORIDE 0.9 % IV SOLN: INTRAVENOUS | Status: DC

## 2021-03-11 ENCOUNTER — Other Ambulatory Visit: Payer: Self-pay

## 2021-03-11 ENCOUNTER — Ambulatory Visit: Payer: BC Managed Care – PPO | Admitting: Urgent Care

## 2021-03-11 ENCOUNTER — Observation Stay
Admission: RE | Admit: 2021-03-11 | Discharge: 2021-03-12 | Disposition: A | Discharge status: Home / Self Care | Payer: BC Managed Care – PPO | Attending: Surgery | Admitting: Surgery

## 2021-03-11 ENCOUNTER — Encounter
Admission: RE | Disposition: A | Discharge status: Home / Self Care | Payer: Self-pay | Source: Home / Self Care | Attending: Surgery

## 2021-03-11 ENCOUNTER — Observation Stay: Payer: BC Managed Care – PPO

## 2021-03-11 ENCOUNTER — Encounter: Payer: Self-pay | Admitting: Surgery

## 2021-03-11 DIAGNOSIS — Z85528 Personal history of other malignant neoplasm of kidney: Secondary | ICD-10-CM | POA: Insufficient documentation

## 2021-03-11 DIAGNOSIS — K440 Diaphragmatic hernia with obstruction, without gangrene: Secondary | ICD-10-CM | POA: Diagnosis not present

## 2021-03-11 DIAGNOSIS — K449 Diaphragmatic hernia without obstruction or gangrene: Principal | ICD-10-CM

## 2021-03-11 HISTORY — PX: XI ROBOTIC ASSISTED REPAIR OF DIAPHRAGMATIC HERNIA: SHX6865

## 2021-03-11 HISTORY — PX: CHEST TUBE INSERTION: SHX231

## 2021-03-11 LAB — CBC
HCT: 35.2 % — ABNORMAL LOW (ref 36.0–46.0)
Hemoglobin: 12.2 g/dL (ref 12.0–15.0)
MCH: 32.8 pg (ref 26.0–34.0)
MCHC: 34.7 g/dL (ref 30.0–36.0)
MCV: 94.6 fL (ref 80.0–100.0)
Platelets: 207 10*3/uL (ref 150–400)
RBC: 3.72 MIL/uL — ABNORMAL LOW (ref 3.87–5.11)
RDW: 14.5 % (ref 11.5–15.5)
WBC: 10.1 10*3/uL (ref 4.0–10.5)
nRBC: 0 % (ref 0.0–0.2)

## 2021-03-11 LAB — CREATININE, SERUM
Creatinine, Ser: 1.08 mg/dL — ABNORMAL HIGH (ref 0.44–1.00)
GFR, Estimated: >60 mL/min (ref >60)

## 2021-03-11 LAB — POCT PREGNANCY, URINE: Preg Test, Ur: NEGATIVE

## 2021-03-11 IMAGING — DX DG CHEST 1V PORT
1 series · 1 of 1 positions shown · non-contrast
Comparison: Chest radiograph 01/15/2021

CLINICAL DATA: Chest tube insertion. Patient with left
diaphragmatic repair earlier today per electronic records.

EXAM:
PORTABLE CHEST 1 VIEW

[chest ap]
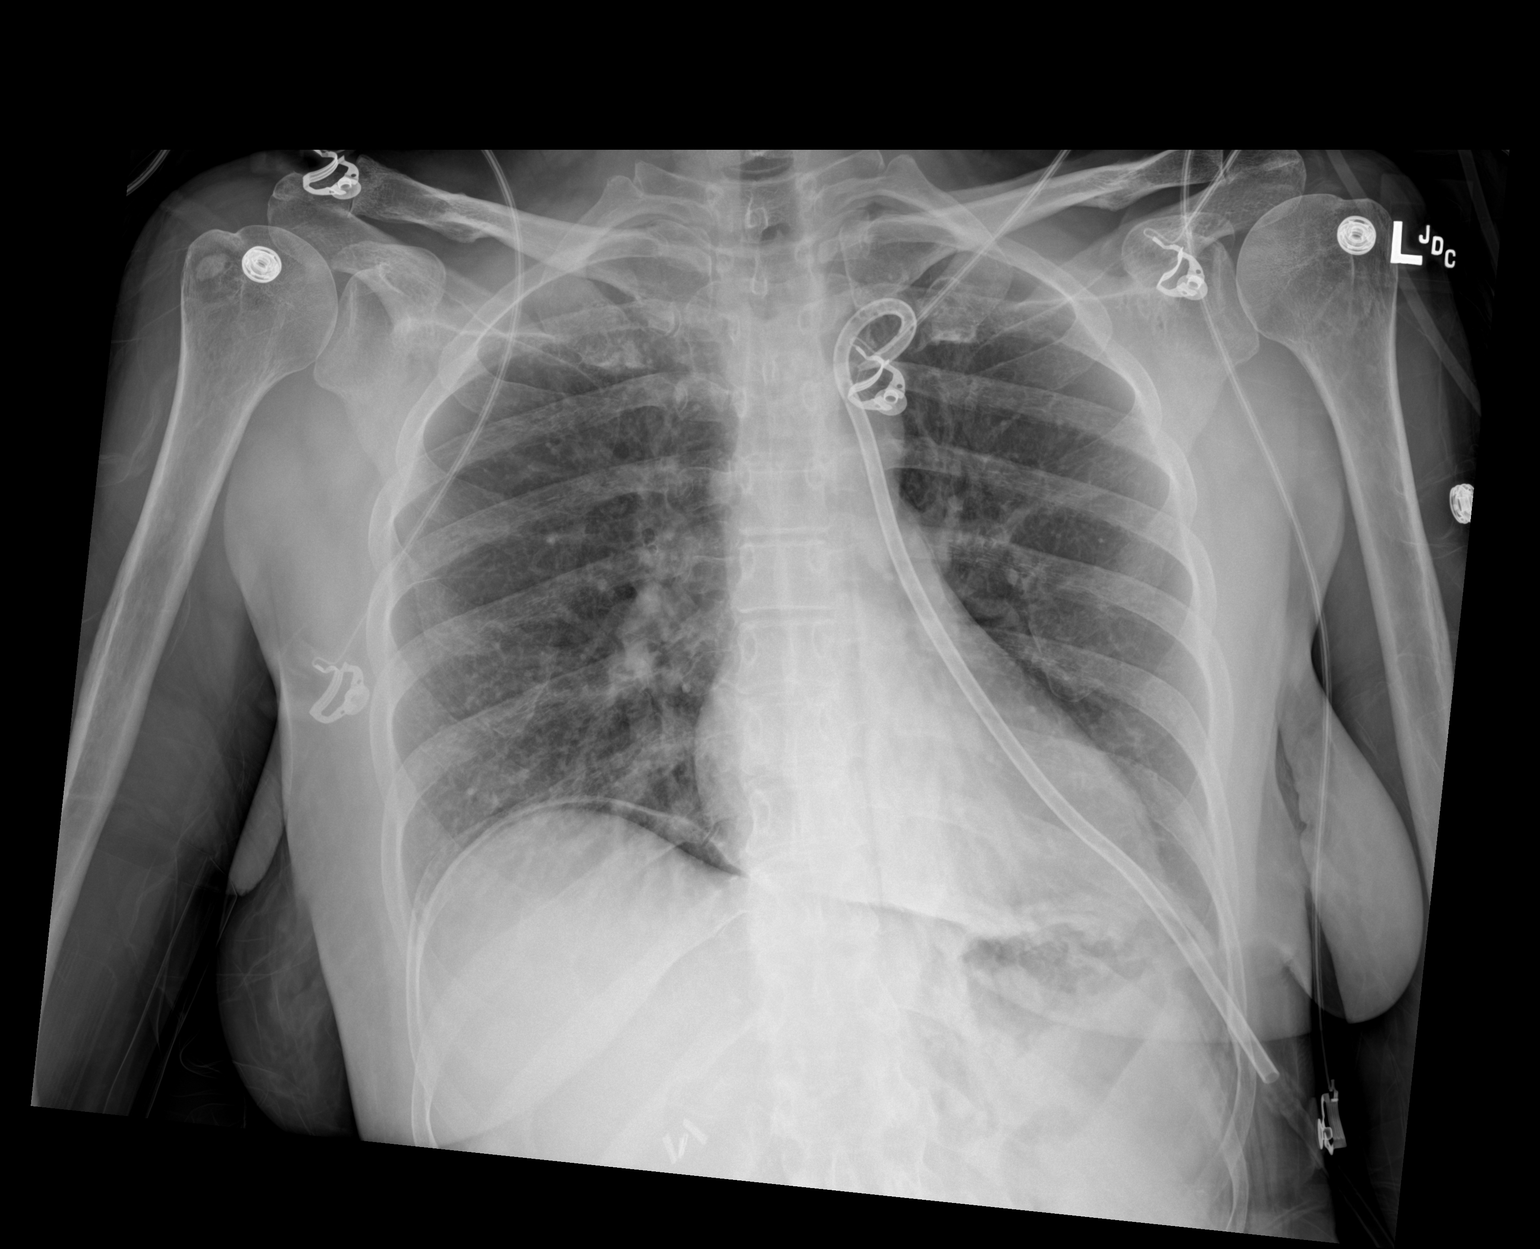

[1 of 1 positions shown; findings below may reference images not displayed]

FINDINGS: Left pigtail catheter with tip near the apex. Questionable tiny
apical pneumothorax visualized under the lateral second rib. There
is mild left basilar opacity likely atelectasis. Lung volumes are
low small volume pneumomediastinum. Normal heart size with unchanged
mediastinal contours. No pulmonary edema. Air under the right
hemidiaphragm consistent with surgery earlier today.
IMPRESSION: 1. Questionable tiny left apical pneumothorax. Left chest tube in
place.
2. Small volume pneumomediastinum.
3. Mild left basilar atelectasis.

## 2021-03-11 SURGERY — CHEST TUBE INSERTION
Anesthesia: General

## 2021-03-11 MED ORDER — HEPARIN SODIUM (PORCINE) 5000 UNIT/ML IJ SOLN: 5000.0000 [IU] | Freq: Once | INTRAMUSCULAR | Status: AC

## 2021-03-11 MED ORDER — ALBUMIN HUMAN 5 % IV SOLN: INTRAVENOUS | Status: DC | PRN

## 2021-03-11 MED ORDER — CHLORHEXIDINE GLUCONATE 0.12 % MT SOLN
OROMUCOSAL | Status: AC
  Administered 2021-03-11: 15 mL via OROMUCOSAL
  Filled 2021-03-11: qty 15

## 2021-03-11 MED ORDER — CLINDAMYCIN PHOSPHATE 900 MG/50ML IV SOLN
900.0000 mg | INTRAVENOUS | Status: AC
  Administered 2021-03-11: 900 mg via INTRAVENOUS

## 2021-03-11 MED ORDER — PROCHLORPERAZINE EDISYLATE 10 MG/2ML IJ SOLN: 5.0000 mg | Freq: Four times a day (QID) | INTRAMUSCULAR | Status: DC | PRN

## 2021-03-11 MED ORDER — ONDANSETRON 4 MG PO TBDP: 4.0000 mg | ORAL_TABLET | Freq: Four times a day (QID) | ORAL | Status: DC | PRN

## 2021-03-11 MED ORDER — OXYCODONE HCL 5 MG PO TABS
5.0000 mg | ORAL_TABLET | ORAL | Status: DC | PRN
  Administered 2021-03-11: 5 mg via ORAL

## 2021-03-11 MED ORDER — ONDANSETRON HCL 4 MG/2ML IJ SOLN: 4.0000 mg | Freq: Four times a day (QID) | INTRAMUSCULAR | Status: DC | PRN

## 2021-03-11 MED ORDER — SUGAMMADEX SODIUM 200 MG/2ML IV SOLN
INTRAVENOUS | Status: DC | PRN
  Administered 2021-03-11: 150 mg via INTRAVENOUS

## 2021-03-11 MED ORDER — 0.9 % SODIUM CHLORIDE (POUR BTL) OPTIME
TOPICAL | Status: DC | PRN
  Administered 2021-03-11 (×2): 500 mL

## 2021-03-11 MED ORDER — SUCCINYLCHOLINE CHLORIDE 20 MG/ML IJ SOLN
INTRAMUSCULAR | Status: DC | PRN
  Administered 2021-03-11: 80 mg via INTRAVENOUS

## 2021-03-11 MED ORDER — GABAPENTIN 300 MG PO CAPS
ORAL_CAPSULE | ORAL | Status: AC
  Administered 2021-03-11: 300 mg via ORAL
  Filled 2021-03-11: qty 1

## 2021-03-11 MED ORDER — SODIUM CHLORIDE 0.9 % IV SOLN: INTRAVENOUS | Status: DC

## 2021-03-11 MED ORDER — ENOXAPARIN SODIUM 40 MG/0.4ML IJ SOSY
40.0000 mg | PREFILLED_SYRINGE | INTRAMUSCULAR | Status: DC
  Administered 2021-03-12: 40 mg via SUBCUTANEOUS
  Filled 2021-03-11: qty 0.4

## 2021-03-11 MED ORDER — KETOROLAC TROMETHAMINE 30 MG/ML IJ SOLN
INTRAMUSCULAR | Status: AC
  Filled 2021-03-11: qty 1

## 2021-03-11 MED ORDER — ACETAMINOPHEN 500 MG PO TABS
ORAL_TABLET | ORAL | Status: AC
  Administered 2021-03-11: 1000 mg via ORAL
  Filled 2021-03-11: qty 2

## 2021-03-11 MED ORDER — SODIUM CHLORIDE 0.9 % IV SOLN
INTRAVENOUS | Status: DC | PRN
  Administered 2021-03-11: 30 ug/min via INTRAVENOUS

## 2021-03-11 MED ORDER — FENTANYL CITRATE (PF) 100 MCG/2ML IJ SOLN
INTRAMUSCULAR | Status: DC | PRN
  Administered 2021-03-11: 50 ug via INTRAVENOUS
  Administered 2021-03-11 (×3): 25 ug via INTRAVENOUS

## 2021-03-11 MED ORDER — SEVOFLURANE IN SOLN
RESPIRATORY_TRACT | Status: AC
  Filled 2021-03-11: qty 250

## 2021-03-11 MED ORDER — SODIUM CHLORIDE FLUSH 0.9 % IV SOLN
INTRAVENOUS | Status: AC
  Filled 2021-03-11: qty 10

## 2021-03-11 MED ORDER — PROPOFOL 10 MG/ML IV BOLUS
INTRAVENOUS | Status: DC | PRN
  Administered 2021-03-11: 120 mg via INTRAVENOUS

## 2021-03-11 MED ORDER — AZATHIOPRINE 50 MG PO TABS
150.0000 mg | ORAL_TABLET | Freq: Every day | ORAL | Status: DC
  Administered 2021-03-12: 150 mg via ORAL
  Filled 2021-03-11: qty 3

## 2021-03-11 MED ORDER — FENTANYL CITRATE (PF) 100 MCG/2ML IJ SOLN
INTRAMUSCULAR | Status: AC
  Administered 2021-03-11: 25 ug via INTRAVENOUS
  Filled 2021-03-11: qty 2

## 2021-03-11 MED ORDER — FENTANYL CITRATE (PF) 100 MCG/2ML IJ SOLN
INTRAMUSCULAR | Status: AC
  Filled 2021-03-11: qty 2

## 2021-03-11 MED ORDER — DEXMEDETOMIDINE (PRECEDEX) IN NS 20 MCG/5ML (4 MCG/ML) IV SYRINGE
PREFILLED_SYRINGE | INTRAVENOUS | Status: DC | PRN
  Administered 2021-03-11: 4 ug via INTRAVENOUS
  Administered 2021-03-11 (×2): 8 ug via INTRAVENOUS

## 2021-03-11 MED ORDER — KETOROLAC TROMETHAMINE 30 MG/ML IJ SOLN
30.0000 mg | Freq: Four times a day (QID) | INTRAMUSCULAR | Status: DC
  Administered 2021-03-11 – 2021-03-12 (×3): 30 mg via INTRAVENOUS
  Filled 2021-03-11 (×2): qty 1

## 2021-03-11 MED ORDER — CELECOXIB 200 MG PO CAPS
ORAL_CAPSULE | ORAL | Status: AC
  Administered 2021-03-11: 200 mg via ORAL
  Filled 2021-03-11: qty 1

## 2021-03-11 MED ORDER — ALBUMIN HUMAN 5 % IV SOLN
INTRAVENOUS | Status: AC
  Filled 2021-03-11: qty 250

## 2021-03-11 MED ORDER — DEXMEDETOMIDINE (PRECEDEX) IN NS 20 MCG/5ML (4 MCG/ML) IV SYRINGE
PREFILLED_SYRINGE | INTRAVENOUS | Status: AC
  Filled 2021-03-11: qty 5

## 2021-03-11 MED ORDER — ONDANSETRON HCL 4 MG/2ML IJ SOLN
INTRAMUSCULAR | Status: DC | PRN
  Administered 2021-03-11: 4 mg via INTRAVENOUS

## 2021-03-11 MED ORDER — CELECOXIB 200 MG PO CAPS: 200.0000 mg | ORAL_CAPSULE | ORAL | Status: AC

## 2021-03-11 MED ORDER — HEPARIN SODIUM (PORCINE) 5000 UNIT/ML IJ SOLN
INTRAMUSCULAR | Status: AC
  Administered 2021-03-11: 5000 [IU] via SUBCUTANEOUS
  Filled 2021-03-11: qty 1

## 2021-03-11 MED ORDER — OXYCODONE HCL 5 MG PO TABS
ORAL_TABLET | ORAL | Status: AC
  Filled 2021-03-11: qty 1

## 2021-03-11 MED ORDER — BUPIVACAINE LIPOSOME 1.3 % IJ SUSP
INTRAMUSCULAR | Status: DC | PRN
  Administered 2021-03-11: 20 mL

## 2021-03-11 MED ORDER — DIPHENHYDRAMINE HCL 50 MG/ML IJ SOLN: 12.5000 mg | Freq: Four times a day (QID) | INTRAMUSCULAR | Status: DC | PRN

## 2021-03-11 MED ORDER — DIPHENHYDRAMINE HCL 12.5 MG/5ML PO ELIX
12.5000 mg | ORAL_SOLUTION | Freq: Four times a day (QID) | ORAL | Status: DC | PRN
  Filled 2021-03-11: qty 5

## 2021-03-11 MED ORDER — EPHEDRINE 5 MG/ML INJ
INTRAVENOUS | Status: AC
  Filled 2021-03-11: qty 10

## 2021-03-11 MED ORDER — DEXAMETHASONE SODIUM PHOSPHATE 10 MG/ML IJ SOLN
INTRAMUSCULAR | Status: DC | PRN
  Administered 2021-03-11: 10 mg via INTRAVENOUS

## 2021-03-11 MED ORDER — CLINDAMYCIN PHOSPHATE 900 MG/50ML IV SOLN
INTRAVENOUS | Status: AC
  Filled 2021-03-11: qty 50

## 2021-03-11 MED ORDER — EPHEDRINE SULFATE 50 MG/ML IJ SOLN
INTRAMUSCULAR | Status: DC | PRN
  Administered 2021-03-11: 10 mg via INTRAVENOUS
  Administered 2021-03-11: 5 mg via INTRAVENOUS

## 2021-03-11 MED ORDER — KETAMINE HCL 10 MG/ML IJ SOLN
INTRAMUSCULAR | Status: DC | PRN
  Administered 2021-03-11: 10 mg via INTRAVENOUS
  Administered 2021-03-11: 30 mg via INTRAVENOUS
  Administered 2021-03-11: 10 mg via INTRAVENOUS

## 2021-03-11 MED ORDER — ZOLPIDEM TARTRATE 5 MG PO TABS: 5.0000 mg | ORAL_TABLET | Freq: Every evening | ORAL | Status: DC | PRN

## 2021-03-11 MED ORDER — PANTOPRAZOLE SODIUM 40 MG PO TBEC
40.0000 mg | DELAYED_RELEASE_TABLET | Freq: Every day | ORAL | Status: DC
  Administered 2021-03-12: 40 mg via ORAL
  Filled 2021-03-11: qty 1

## 2021-03-11 MED ORDER — BUPIVACAINE-EPINEPHRINE (PF) 0.25% -1:200000 IJ SOLN
INTRAMUSCULAR | Status: AC
  Filled 2021-03-11: qty 30

## 2021-03-11 MED ORDER — PROPOFOL 10 MG/ML IV BOLUS
INTRAVENOUS | Status: AC
  Filled 2021-03-11: qty 20

## 2021-03-11 MED ORDER — GABAPENTIN 300 MG PO CAPS: 300.0000 mg | ORAL_CAPSULE | ORAL | Status: AC

## 2021-03-11 MED ORDER — ACETAMINOPHEN 500 MG PO TABS
1000.0000 mg | ORAL_TABLET | ORAL | Status: AC
Start: 2021-03-12 — End: 2021-03-11

## 2021-03-11 MED ORDER — SODIUM CHLORIDE 0.9 % IV SOLN
10.0000 mL/h | Freq: Once | INTRAVENOUS | Status: AC
  Administered 2021-03-11: 10 mL/h via INTRAVENOUS

## 2021-03-11 MED ORDER — MIDAZOLAM HCL 2 MG/2ML IJ SOLN
INTRAMUSCULAR | Status: AC
  Filled 2021-03-11: qty 2

## 2021-03-11 MED ORDER — CHLORHEXIDINE GLUCONATE CLOTH 2 % EX PADS
6.0000 | MEDICATED_PAD | Freq: Once | CUTANEOUS | Status: AC
  Administered 2021-03-11: 6 via TOPICAL

## 2021-03-11 MED ORDER — LIDOCAINE HCL (CARDIAC) PF 100 MG/5ML IV SOSY
PREFILLED_SYRINGE | INTRAVENOUS | Status: DC | PRN
  Administered 2021-03-11: 40 mg via INTRAVENOUS

## 2021-03-11 MED ORDER — LIDOCAINE HCL (PF) 2 % IJ SOLN
INTRAMUSCULAR | Status: AC
  Filled 2021-03-11: qty 5

## 2021-03-11 MED ORDER — FENTANYL CITRATE (PF) 100 MCG/2ML IJ SOLN
25.0000 ug | INTRAMUSCULAR | Status: DC | PRN
  Administered 2021-03-11: 25 ug via INTRAVENOUS
  Administered 2021-03-11: 50 ug via INTRAVENOUS

## 2021-03-11 MED ORDER — SODIUM CHLORIDE 0.9 % IR SOLN
Status: DC | PRN
  Administered 2021-03-11: 1000 mL

## 2021-03-11 MED ORDER — PREGABALIN 50 MG PO CAPS
50.0000 mg | ORAL_CAPSULE | Freq: Three times a day (TID) | ORAL | Status: DC
  Administered 2021-03-11: 50 mg via ORAL
  Filled 2021-03-11: qty 1

## 2021-03-11 MED ORDER — SODIUM CHLORIDE (PF) 0.9 % IJ SOLN
INTRAMUSCULAR | Status: DC | PRN
  Administered 2021-03-11: 50 mL

## 2021-03-11 MED ORDER — MIDAZOLAM HCL 2 MG/2ML IJ SOLN
INTRAMUSCULAR | Status: DC | PRN
  Administered 2021-03-11: 2 mg via INTRAVENOUS

## 2021-03-11 MED ORDER — HEMOSTATIC AGENTS (NO CHARGE) OPTIME
TOPICAL | Status: DC | PRN
  Administered 2021-03-11: 1 via TOPICAL

## 2021-03-11 MED ORDER — ACETAMINOPHEN 500 MG PO TABS
1000.0000 mg | ORAL_TABLET | Freq: Four times a day (QID) | ORAL | Status: DC
  Administered 2021-03-12 (×3): 1000 mg via ORAL
  Filled 2021-03-11 (×3): qty 2

## 2021-03-11 MED ORDER — APREPITANT 40 MG PO CAPS
ORAL_CAPSULE | ORAL | Status: AC
  Administered 2021-03-11: 40 mg via ORAL
  Filled 2021-03-11: qty 1

## 2021-03-11 MED ORDER — ROCURONIUM BROMIDE 100 MG/10ML IV SOLN
INTRAVENOUS | Status: DC | PRN
  Administered 2021-03-11: 10 mg via INTRAVENOUS
  Administered 2021-03-11: 50 mg via INTRAVENOUS
  Administered 2021-03-11: 10 mg via INTRAVENOUS
  Administered 2021-03-11: 20 mg via INTRAVENOUS

## 2021-03-11 MED ORDER — PROCHLORPERAZINE MALEATE 10 MG PO TABS
10.0000 mg | ORAL_TABLET | Freq: Four times a day (QID) | ORAL | Status: DC | PRN
  Filled 2021-03-11: qty 1

## 2021-03-11 MED ORDER — PHENYLEPHRINE HCL (PRESSORS) 10 MG/ML IV SOLN
INTRAVENOUS | Status: DC | PRN
  Administered 2021-03-11 (×3): 100 ug via INTRAVENOUS

## 2021-03-11 MED ORDER — SODIUM CHLORIDE (PF) 0.9 % IJ SOLN
INTRAMUSCULAR | Status: AC
  Filled 2021-03-11: qty 50

## 2021-03-11 MED ORDER — BUPIVACAINE LIPOSOME 1.3 % IJ SUSP
INTRAMUSCULAR | Status: AC
  Filled 2021-03-11: qty 20

## 2021-03-11 MED ORDER — MORPHINE SULFATE (PF) 2 MG/ML IV SOLN: 2.0000 mg | INTRAVENOUS | Status: DC | PRN

## 2021-03-11 MED ORDER — BUPIVACAINE-EPINEPHRINE 0.25% -1:200000 IJ SOLN
INTRAMUSCULAR | Status: DC | PRN
  Administered 2021-03-11: 30 mL

## 2021-03-11 MED ORDER — PROMETHAZINE HCL 25 MG/ML IJ SOLN: 6.2500 mg | INTRAMUSCULAR | Status: DC | PRN

## 2021-03-11 MED ORDER — "VISTASEAL 4 ML SINGLE DOSE KIT "
PACK | CUTANEOUS | Status: DC | PRN
  Administered 2021-03-11: 4 mL via TOPICAL

## 2021-03-11 MED ORDER — KETAMINE HCL 50 MG/5ML IJ SOSY
PREFILLED_SYRINGE | INTRAMUSCULAR | Status: AC
  Filled 2021-03-11: qty 5

## 2021-03-11 SURGICAL SUPPLY — 69 items
APPLICATOR VISTASEAL 35 (MISCELLANEOUS) ×4 IMPLANT
CANISTER SUCT 1200ML W/VALVE (MISCELLANEOUS) ×4 IMPLANT
CANNULA REDUC XI 12-8 STAPL (CANNULA) ×3
CANNULA REDUC XI 12-8MM STAPL (CANNULA) ×1
CANNULA REDUCER 12-8 DVNC XI (CANNULA) ×2 IMPLANT
CHLORAPREP W/TINT 26 (MISCELLANEOUS) ×4 IMPLANT
COVER TIP SHEARS 8 DVNC (MISCELLANEOUS) ×2 IMPLANT
COVER TIP SHEARS 8MM DA VINCI (MISCELLANEOUS) ×4
DECANTER SPIKE VIAL GLASS SM (MISCELLANEOUS) ×4 IMPLANT
DEFOGGER SCOPE WARMER CLEARIFY (MISCELLANEOUS) ×4 IMPLANT
DERMABOND ADVANCED (GAUZE/BANDAGES/DRESSINGS) ×2
DERMABOND ADVANCED .7 DNX12 (GAUZE/BANDAGES/DRESSINGS) ×2 IMPLANT
DRAPE 3/4 80X56 (DRAPES) ×4 IMPLANT
DRAPE ARM DVNC X/XI (DISPOSABLE) ×8 IMPLANT
DRAPE COLUMN DVNC XI (DISPOSABLE) ×2 IMPLANT
DRAPE DA VINCI XI ARM (DISPOSABLE) ×16
DRAPE DA VINCI XI COLUMN (DISPOSABLE) ×4
DRAPE INCISE IOBAN 66X45 STRL (DRAPES) ×4 IMPLANT
ELECT CAUTERY BLADE 6.4 (BLADE) ×4 IMPLANT
ELECT REM PT RETURN 9FT ADLT (ELECTROSURGICAL) ×4
ELECTRODE REM PT RTRN 9FT ADLT (ELECTROSURGICAL) ×2 IMPLANT
GAUZE 4X4 16PLY ~~LOC~~+RFID DBL (SPONGE) IMPLANT
GLOVE SURG ENC MOIS LTX SZ7 (GLOVE) ×12 IMPLANT
GOWN STRL REUS W/ TWL LRG LVL3 (GOWN DISPOSABLE) ×8 IMPLANT
GOWN STRL REUS W/TWL LRG LVL3 (GOWN DISPOSABLE) ×16
GRASPER LAPSCPC 5X45 DSP (INSTRUMENTS) ×4 IMPLANT
HEMOSTAT SURGICEL 2X14 (HEMOSTASIS) ×4 IMPLANT
HEMOSTAT SURGICEL 2X3 (HEMOSTASIS) ×4 IMPLANT
IRRIGATION STRYKERFLOW (MISCELLANEOUS) ×2 IMPLANT
IRRIGATOR STRYKERFLOW (MISCELLANEOUS) ×4
IV NS 1000ML (IV SOLUTION) ×4
IV NS 1000ML BAXH (IV SOLUTION) ×2 IMPLANT
KIT PINK PAD W/HEAD ARE REST (MISCELLANEOUS) ×4
KIT PINK PAD W/HEAD ARM REST (MISCELLANEOUS) ×2 IMPLANT
KIT TURNOVER CYSTO (KITS) ×4 IMPLANT
LABEL OR SOLS (LABEL) ×4 IMPLANT
MANIFOLD NEPTUNE II (INSTRUMENTS) ×4 IMPLANT
MESH VENTRALIGHT ST 4X6IN (Mesh General) ×4 IMPLANT
NEEDLE HYPO 22GX1.5 SAFETY (NEEDLE) ×4 IMPLANT
NS IRRIG 500ML POUR BTL (IV SOLUTION) ×8 IMPLANT
OBTURATOR OPTICAL STANDARD 8MM (TROCAR) ×4
OBTURATOR OPTICAL STND 8 DVNC (TROCAR) ×2
OBTURATOR OPTICALSTD 8 DVNC (TROCAR) ×2 IMPLANT
PACK LAP CHOLECYSTECTOMY (MISCELLANEOUS) ×4 IMPLANT
PENCIL ELECTRO HAND CTR (MISCELLANEOUS) ×4 IMPLANT
SEAL CANN UNIV 5-8 DVNC XI (MISCELLANEOUS) ×6 IMPLANT
SEAL XI 5MM-8MM UNIVERSAL (MISCELLANEOUS) ×12
SEALER VESSEL DA VINCI XI (MISCELLANEOUS) ×4
SEALER VESSEL EXT DVNC XI (MISCELLANEOUS) ×2 IMPLANT
SOLUTION ELECTROLUBE (MISCELLANEOUS) ×4 IMPLANT
SPONGE T-LAP 18X18 ~~LOC~~+RFID (SPONGE) ×4 IMPLANT
STAPLER CANNULA SEAL DVNC XI (STAPLE) ×2 IMPLANT
STAPLER CANNULA SEAL XI (STAPLE) ×4
SUT MNCRL 4-0 (SUTURE) ×4
SUT MNCRL 4-0 27XMFL (SUTURE) ×2
SUT SILK 2 0 SH (SUTURE) ×12 IMPLANT
SUT STRATAFIX PDS 30 CT-1 (SUTURE) ×8 IMPLANT
SUT VICRYL 0 AB UR-6 (SUTURE) ×8 IMPLANT
SUT VLOC 90 S/L VL9 GS22 (SUTURE) ×12 IMPLANT
SUTURE MNCRL 4-0 27XMF (SUTURE) ×2 IMPLANT
SYR 20ML LL LF (SYRINGE) ×4 IMPLANT
SYR 30ML LL (SYRINGE) ×4 IMPLANT
SYSTEM SAHARA CHEST DRAIN ATS (WOUND CARE) ×4 IMPLANT
TAPE TRANSPORE STRL 2 31045 (GAUZE/BANDAGES/DRESSINGS) ×4 IMPLANT
TRAY FOLEY SLVR 16FR LF STAT (SET/KITS/TRAYS/PACK) ×4 IMPLANT
TRAY WAYNE PNEUMOTHORAX 14X18 (TRAY / TRAY PROCEDURE) ×4 IMPLANT
TROCAR BALLN GELPORT 12X130M (ENDOMECHANICALS) ×4 IMPLANT
TROCAR XCEL NON-BLD 5MMX100MML (ENDOMECHANICALS) IMPLANT
TUBING EVAC SMOKE HEATED PNEUM (TUBING) ×4 IMPLANT

## 2021-03-11 NOTE — Op Note (Addendum)
Robotic assisted laparoscopic Left diaphragmatic  repair using Ventralight  mesh 4x6 inches Placement of Thoracostomy tube on the left Laparoscopic takedown of splenic flexure  Pre-operative Diagnosis: Large left diaphragmatic hernia  Post-operative Diagnosis: same  Procedure:  Robotic assisted laparoscopicLeft diaphragmatic  repair using Ventralight  mesh 4x6 inches Placement of Thoracostomy tube on the left  Surgeon: Caroleen Hamman, MD FACS  Assistant: Evangeline Dakin . Required due to the complexity of the case the need for exposure and lack of first assist.  Anesthesia: Gen. with endotracheal tube  Findings: Left diaphragmatic hernia containing splenic flexure, colon, stomach and spleen. Defect measured 9 cms x 6 cms   Estimated Blood Loss: 25cc             Complications: none   Procedure Details  The patient was seen again in the Holding Room. The benefits, complications, treatment options, and expected outcomes were discussed with the patient. The risks of bleeding, infection, recurrence of symptoms, failure to resolve symptoms,  esophageal damage, Dysphagia, bowel injury, any of which could require further surgery were reviewed with the patient. The likelihood of improving the patient's symptoms with return to their baseline status is good.  The patient and/or family concurred with the proposed plan, giving informed consent.  The patient was taken to Operating Room, identified  and the procedure verified.  A Time Out was held and the above information confirmed.  Prior to the induction of general anesthesia, antibiotic prophylaxis was administered. VTE prophylaxis was in place. General endotracheal anesthesia was then administered and tolerated well. After the induction, the abdomen was prepped with Chloraprep and draped in the sterile fashion. The patient was positioned in the supine position.  Cut down technique was used to enter the abdominal cavity and a Hasson trochar was  placed after two vicryl stitches were anchored to the fascia. Pneumoperitoneum was then created with CO2 and tolerated well without any adverse changes in the patient's vital signs.  Three 8-mm ports were placed under direct vision. All skin incisions  were infiltrated with a local anesthetic agent before making the incision and placing the trocars. An additional 5 mm regular laparoscopic port was placed to assist with retraction and exposure.   The patient was positioned  in reverse Trendelenburg  with her left side up, robot was brought to the surgical field and docked in the standard fashion.  We made sure all the instrumentation was kept indirect view at all times and that there were no collision between the arms. I scrubbed out and went to the console.  The diaphragmatic hernia was seen containing spleen, splenic flexure, stomach.  I was able to pull down the stomach and reduce the contents of his hernia to the intra-abdominal cavity.  I used meticulous dissection.  There was a small laceration to the spleen that was appropriately controlled with cautery.  Again meticulous dissection was performed to allow out all the intra-abdominal contents delayed below the left diaphragm.  The splenic flexure was taken down with the vessel sealer.  I was able to then assess the left and the right crew.  Pars flaccida was entered and the GE junction was found to be below the diaphragm.  I was able to mobilize the left crura of the diaphragm Twilight mobility and closure of the diaphragmatic defect.  The defect measured 9 cm x 6 cm in size.  The hernia was reduced.  We visualized the left lung after everything was reduced.  I closed the diaphragmatic defect  with a running strata fix suture.  Please note that the posterior attachment of the diaphragm was very debilitated and unfortunately we did not have much to close it to.  I decided to reinforce to the repair using and ventral light oval mesh measuring 4 x 6 inches.   It was circumferentially tacked to the diaphragm using 2 OV lock suture.  We did not perform any fundoplication and there was no evidence of any other injuries.  I was were very happy with the defect.  Given the large size I decided to perform a percutaneous tube trial thoracostomy tube.  Under laparoscopic visualization we make sure that the left tube was placed.  Using the modified Seldinger technique the left pleura was aspirated enter.  A guidewire was placed and a letter was placed in the standard fashion.  A pigtail catheter was placed in the standard fashion and secured to the chest wall using 2-0 silk sutures. Using liposomal Marcaine I performed several left intercostal nerve blocks and abdominal bilateral T AP blocks under direct visualization.  The robot was undocked and all the instruments were removed.  Second look laparoscopy revealed no evidence of any active eating or injuries.  I was happy with how the mesh laid but unfortunately the posterior attachment of the diaphragm was very tenuous.  He also noted that the spleen the splenic flexure and all the intra-abdominal contents were below the mesh. The laparoscopic ports were removed and the fascia was closed with multiple interrupted 0 Vicryl in standard fashion.  Pneumoperitoneum was also deflated.  Skin incisions were closed with 4-0 Monocryl in a subcuticular fashion.  Chest tube was attached to the Pleur-evac.  Needle and laparotomy counts were correct and there were no immediate complications  I used a robotic arm to retract the liver, the vessel sealer on my right hand and a forced bipolar grasper on my left hand.  There is along the extra 5 mm port allow me ample exposure and the ability to perform meticulous dissection     Inspection of the  upper quadrant was performed. No bleeding, bile  Or esophageal injuries leaks, or bowel injuries were noted. Robotic instruments and robotic arms were undocked in the standard fashion. All the  needles were removed under direct visualization.   I scrubbed back in.  Pneumoperitoneum was released.  The periumbilical port site was closed with interrumpted 0 Vicryl sutures. 4-0 subcuticular Monocryl was used to close the skin. Liposomal marcaine was injected to all the incisions sites.  Dermabond was  applied.  The patient was then extubated and brought to the recovery room in stable condition. Sponge, lap, and needle counts were correct at closure and at the conclusion of the case.               Caroleen Hamman, MD, FACS

## 2021-03-11 NOTE — Progress Notes (Signed)
Chest tube to water seal as per md order via secure chat

## 2021-03-11 NOTE — Anesthesia Postprocedure Evaluation (Signed)
Anesthesia Post Note  Patient: Chelsea Hicks  Procedure(s) Performed: CHEST TUBE INSERTION XI ROBOTIC ASSISTED REPAIR OF DIAPHRAGMATIC HERNIA  Patient location during evaluation: PACU Anesthesia Type: General Level of consciousness: awake and alert Pain management: pain level controlled Vital Signs Assessment: post-procedure vital signs reviewed and stable Respiratory status: spontaneous breathing, nonlabored ventilation, respiratory function stable and patient connected to nasal cannula oxygen Cardiovascular status: blood pressure returned to baseline and stable Postop Assessment: no apparent nausea or vomiting Anesthetic complications: no   No notable events documented.   Last Vitals:  Vitals:   03/11/21 1745 03/11/21 1800  BP: 127/78 128/80  Pulse: 86 83  Resp: 17 16  Temp:  (!) 36.3 C  SpO2: 100% 98%    Last Pain:  Vitals:   03/11/21 1800  TempSrc:   PainSc: 4                  Arita Miss

## 2021-03-11 NOTE — Anesthesia Procedure Notes (Signed)
Procedure Name: Intubation Date/Time: 03/11/2021 1:20 PM Performed by: Debe Coder, CRNA Pre-anesthesia Checklist: Patient identified, Emergency Drugs available, Suction available and Patient being monitored Patient Re-evaluated:Patient Re-evaluated prior to induction Oxygen Delivery Method: Circle system utilized Preoxygenation: Pre-oxygenation with 100% oxygen Induction Type: IV induction Ventilation: Mask ventilation without difficulty Grade View: Grade I Tube type: Oral Tube size: 7.0 mm Number of attempts: 1 Airway Equipment and Method: Stylet and Oral airway Placement Confirmation: ETT inserted through vocal cords under direct vision, positive ETCO2 and breath sounds checked- equal and bilateral Secured at: 22 cm Tube secured with: Tape Dental Injury: Teeth and Oropharynx as per pre-operative assessment

## 2021-03-11 NOTE — Anesthesia Preprocedure Evaluation (Signed)
Anesthesia Evaluation  Patient identified by MRN, date of birth, ID band Patient awake    Reviewed: Allergy & Precautions, H&P , NPO status , reviewed documented beta blocker date and time   History of Anesthesia Complications (+) PONV and history of anesthetic complications  Airway Mallampati: I  TM Distance: >3 FB Neck ROM: full    Dental  (+) Teeth Intact, Dental Advidsory Given   Pulmonary neg pulmonary ROS,    Pulmonary exam normal        Cardiovascular Exercise Tolerance: Good negative cardio ROS Normal cardiovascular exam     Neuro/Psych negative neurological ROS  negative psych ROS   GI/Hepatic Neg liver ROS, GERD  ,  Endo/Other  negative endocrine ROS  Renal/GU Renal disease (s/p left nephrectomy)     Musculoskeletal   Abdominal   Peds  Hematology  (+) Blood dyscrasia, anemia ,   Anesthesia Other Findings Past Medical History: 2013: 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 Vira Agar No date: DILATION AND CURETTAGE OF UTERUS No date: hemoglobin electrophinesis 2009: LIPOMA EXCISION     Comment:  from back 07/18/2012: LIPOMA EXCISION; Left     Comment:  arm No date: TUBAL LIGATION BMI    Body Mass Index: 24.79 kg/m     Reproductive/Obstetrics negative OB ROS                             Anesthesia  Physical  Anesthesia Plan  ASA: 2  Anesthesia Plan: General   Post-op Pain Management:    Induction: Intravenous  PONV Risk Score and Plan: Ondansetron, Treatment may vary due to age or medical condition, Dexamethasone, Aprepitant, Midazolam and Promethazine  Airway Management Planned: Oral ETT  Additional Equipment:   Intra-op Plan:   Post-operative Plan: Extubation in OR  Informed Consent: I have reviewed the patients History and Physical, chart, labs and discussed the procedure including the risks, benefits and alternatives for the proposed anesthesia with the patient or authorized representative who has indicated his/her understanding and acceptance.     Dental Advisory Given  Plan Discussed with: CRNA  Anesthesia Plan Comments:         Anesthesia Quick Evaluation

## 2021-03-11 NOTE — Transfer of Care (Signed)
Immediate Anesthesia Transfer of Care Note  Patient: Chelsea Hicks  Procedure(s) Performed: CHEST TUBE INSERTION XI ROBOTIC ASSISTED REPAIR OF DIAPHRAGMATIC HERNIA  Patient Location: PACU  Anesthesia Type:General  Level of Consciousness: drowsy and patient cooperative  Airway & Oxygen Therapy: Patient Spontanous Breathing and Patient connected to face mask oxygen  Post-op Assessment: Report given to RN and Post -op Vital signs reviewed and stable  Post vital signs: Reviewed and stable  Last Vitals:  Vitals Value Taken Time  BP 117/65 03/11/21 1724  Temp    Pulse 95 03/11/21 1725  Resp 15 03/11/21 1725  SpO2 98 % 03/11/21 1725  Vitals shown include unvalidated device data.  Last Pain:  Vitals:   03/11/21 1157  TempSrc: Oral  PainSc: 0-No pain         Complications: No notable events documented.

## 2021-03-11 NOTE — Interval H&P Note (Signed)
History and Physical Interval Note:  03/11/2021 1:01 PM  Chelsea Hicks  has presented today for surgery, with the diagnosis of paraesophageal hernia.  The various methods of treatment have been discussed with the patient and family. After consideration of risks, benefits and other options for treatment, the patient has consented to  Procedure(s): XI ROBOTIC ASSISTED PARAESOPHAGEAL HERNIA REPAIR, Otho Ket, PA-C to assist (N/A) as a surgical intervention.  The patient's history has been reviewed, patient examined, no change in status, stable for surgery.  I have reviewed the patient's chart and labs.  Questions were answered to the patient's satisfaction.     [place]

## 2021-03-12 ENCOUNTER — Observation Stay: Payer: BC Managed Care – PPO

## 2021-03-12 ENCOUNTER — Encounter: Payer: Self-pay | Admitting: Surgery

## 2021-03-12 DIAGNOSIS — K449 Diaphragmatic hernia without obstruction or gangrene: Secondary | ICD-10-CM | POA: Diagnosis not present

## 2021-03-12 LAB — HIV ANTIBODY (ROUTINE TESTING W REFLEX): HIV Screen 4th Generation wRfx: NONREACTIVE

## 2021-03-12 IMAGING — DX DG CHEST 1V PORT
1 series · 1 of 1 positions shown · non-contrast
Comparison: Yesterday

CLINICAL DATA: Chest tube

EXAM:
PORTABLE CHEST 1 VIEW

[chest ap]
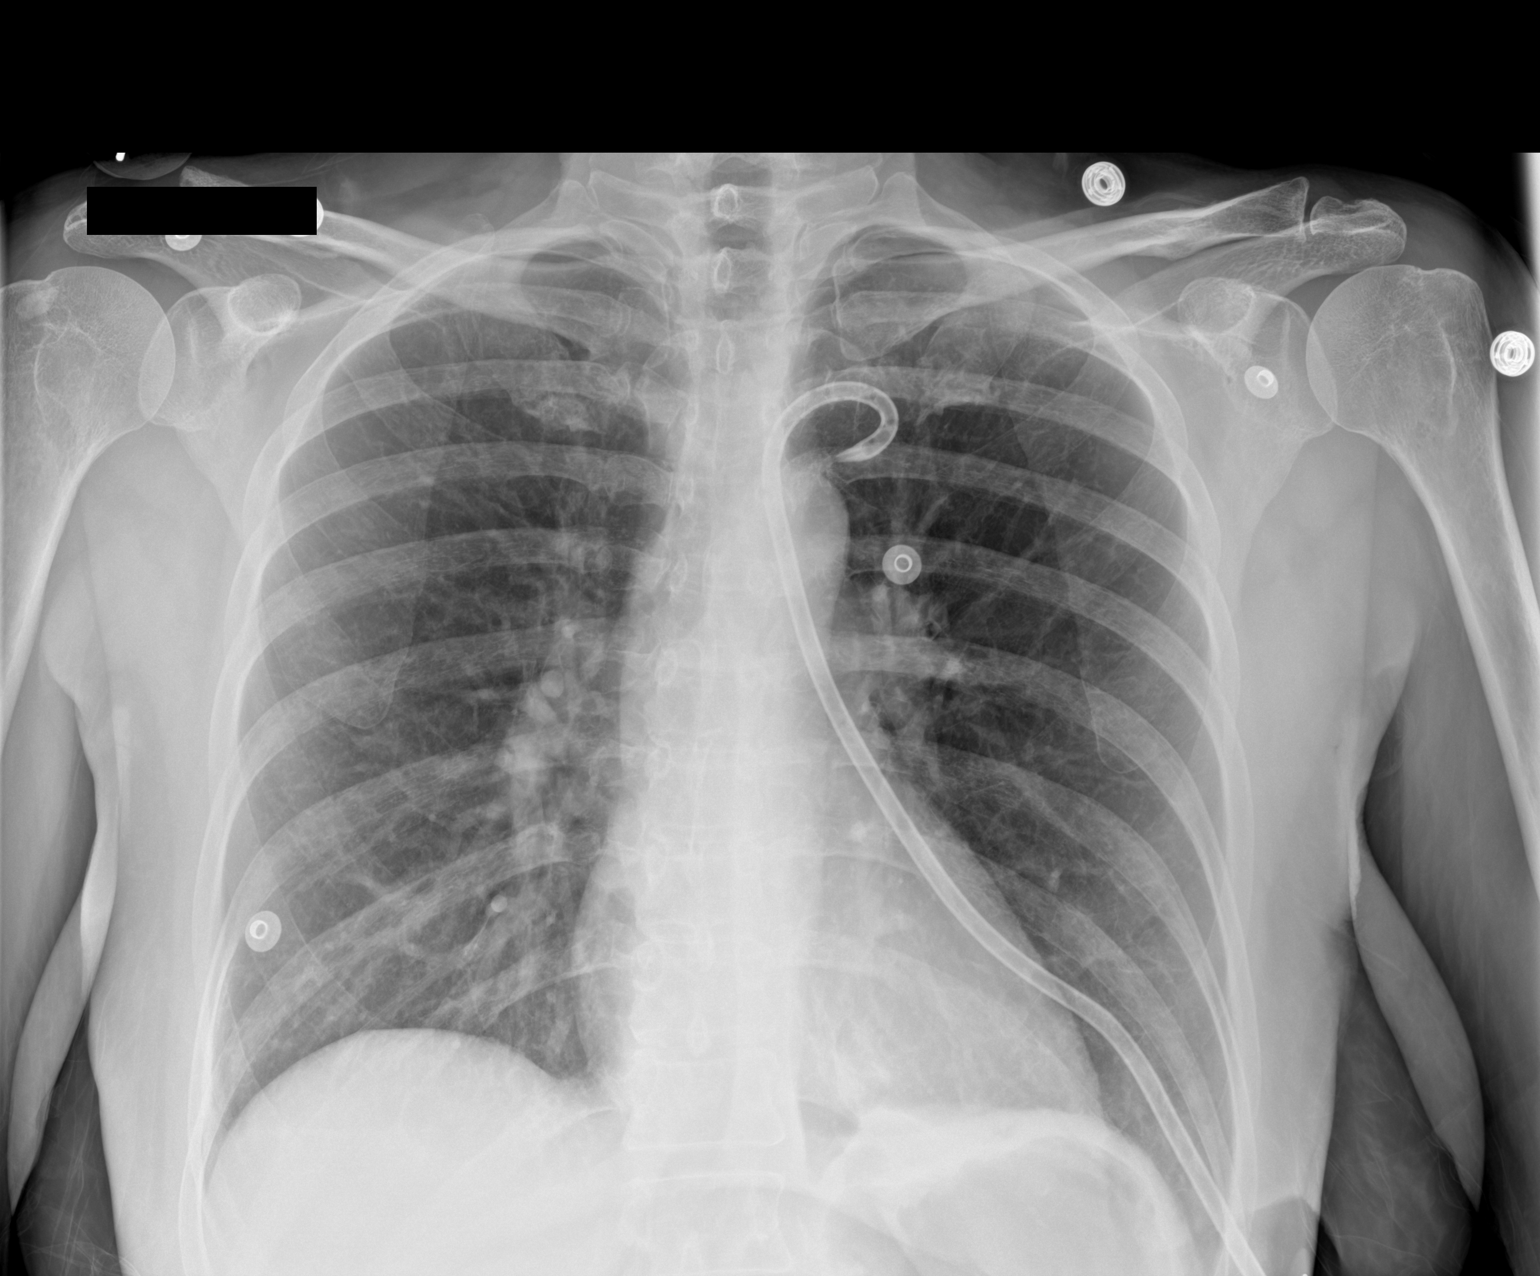

[1 of 1 positions shown; findings below may reference images not displayed]

FINDINGS: Similar positioning of left chest tube with tip at the apex. No
definite pneumothorax. Mild remaining atelectasis at the left base.
IMPRESSION: No visible pneumothorax.  Stable chest tube positioning.

## 2021-03-12 MED ORDER — OXYCODONE HCL 5 MG PO TABS: 5.0000 mg | ORAL_TABLET | Freq: Four times a day (QID) | ORAL | 0 refills | Status: DC | PRN

## 2021-03-12 MED ORDER — ALUM & MAG HYDROXIDE-SIMETH 200-200-20 MG/5ML PO SUSP
30.0000 mL | ORAL | Status: DC | PRN
  Administered 2021-03-12: 30 mL via ORAL
  Filled 2021-03-12: qty 30

## 2021-03-12 NOTE — Discharge Instructions (Signed)
In addition to included general post-operative instructions,  Diet: Resume home diet.   Activity: No heavy lifting >20 pounds (children, pets, laundry, garbage) or strenuous activity until for 6 weeks, but light activity and walking are encouraged. Do not drive or drink alcohol if taking narcotic pain medications or having pain that might distract from driving.  Wound care: Remove chest tube dressing in 2 days (this is waterproof). 2 days after surgery (07/07), you may shower/get incision wet with soapy water and pat dry (do not rub incisions), but no baths or submerging incision underwater until follow-up.   Medications: Resume all home medications. For mild to moderate pain: acetaminophen (Tylenol) or ibuprofen/naproxen (if no kidney disease). Combining Tylenol with alcohol can substantially increase your risk of causing liver disease. Narcotic pain medications, if prescribed, can be used for severe pain, though may cause nausea, constipation, and drowsiness. Do not combine Tylenol and Percocet (or similar) within a 6 hour period as Percocet (and similar) contain(s) Tylenol. If you do not need the narcotic pain medication, you do not need to fill the prescription.  Call office [phone] / [phone]) at any time if any questions, worsening pain, fevers/chills, bleeding, drainage from incision site, or other concerns.

## 2021-03-12 NOTE — Discharge Summary (Signed)
[facility] SURGICAL DISCHARGE SUMMARY  Patient ID: Chelsea Hicks MRN: [id number] DOB/AGE: Jul 26, 1966 55 y.o.  Admit date: 03/11/2021 Discharge date: 03/12/2021  Discharge Diagnoses Patient Active Problem List   Diagnosis Date Noted   Diaphragmatic hernia 02/13/2021    Consultants None  Procedures 03/11/2021:  Robotic assisted laparoscopic Left diaphragmatic  repair using Ventralight  mesh 4x6 inches Placement of Thoracostomy tube on the left Laparoscopic takedown of splenic flexure   HPI: Chelsea Hicks is a 55 y.o. female with a history of left diaphragmatic hernia who presents to [facility] on 07/05 for repair with Dr Dahlia Byes.   Hospital Course: Informed consent was obtained and documented, and patient underwent uneventful robotic assisted laparoscopic left diaphragmatic hernia repair with mesh with left chest tube placement (Dr Dahlia Byes, 03/11/2021).  Post-operatively, patient did very well. Her CXR on 07/06 while on water seal was without evidence of pneumothorax. Chest tube was removed on POD1 without complication. Advancement of patient's diet and ambulation were well-tolerated. The remainder of patient's hospital course was essentially unremarkable, and discharge planning was initiated accordingly with patient safely able to be discharged home with appropriate discharge instructions, pain control, and outpatient follow-up after all of her questions were answered to her expressed satisfaction.    Discharge Condition: Good    Physical Examination:  Constitutional: Well appearing female, NAD Pulmonary: Normal effort, no respiratory distress Chest: Chest tube in lower left lateral chest wall; minimal serosanguinous output, no air leak, site CDI (this was removed) Cardiac: HRRR Gastrointestinal: Soft, non-tender, non-distended, no rebound/guarding Skin: Laparoscopic incisions are CDI with dermabond, no erythema or drainage    Allergies as of 03/12/2021        Reactions   Amoxicillin-pot Clavulanate Hives   Penicillins Hives   Did it involve swelling of the face/tongue/throat, SOB, or low BP? No Did it involve sudden or severe rash/hives, skin peeling, or any reaction on the inside of your mouth or nose? No Did you need to seek medical attention at a hospital or doctor's office? No When did it last happen? More than 15 years ago If all above answers are "NO", may proceed with cephalosporin use.   Sulfonamide Derivatives Hives        Medication List     TAKE these medications    azaTHIOprine 50 MG tablet Commonly known as: IMURAN Take 150 mg by mouth daily.   ferrous sulfate 325 (65 FE) MG tablet Take 325 mg by mouth daily with breakfast.   multivitamin with minerals Tabs tablet Take 1 tablet by mouth daily.   oxyCODONE 5 MG immediate release tablet Commonly known as: Oxy IR/ROXICODONE Take 1 tablet (5 mg total) by mouth every 6 (six) hours as needed for severe pain or breakthrough pain.   pantoprazole 40 MG tablet Commonly known as: PROTONIX Take 40 mg by mouth daily.   Vitamin D3 25 MCG tablet Commonly known as: Vitamin D Take 1,000 Units by mouth daily.          Follow-up Information     [place], [place] F, MD. Schedule an appointment as soon as possible for a visit in 2 week(s).   Specialty: General Surgery Why: s/p laparoscopic left diaphragmatic hernia repair Contact information: [address] [place] [address] [phone]                  Time spent on discharge management including discussion of hospital course, clinical condition, outpatient instructions, prescriptions, and follow up with the patient and members  of the medical team: >30 minutes  -- Edison Simon , PA-C [facility]  03/12/2021, 9:44 AM [phone] M-F: 7am - 4pm

## 2021-03-13 LAB — TYPE AND SCREEN
ABO/RH(D): A POS
Antibody Screen: NEGATIVE
Unit division: 0
Unit division: 0

## 2021-03-13 LAB — BPAM RBC
Blood Product Expiration Date: 202208022359
Blood Product Expiration Date: 202208022359
Unit Type and Rh: 6200
Unit Type and Rh: 6200

## 2021-03-13 LAB — PREPARE RBC (CROSSMATCH)

## 2021-03-17 ENCOUNTER — Telehealth: Payer: Self-pay

## 2021-03-17 NOTE — Telephone Encounter (Signed)
Patient called had a diaphragmatic hernia repair with chest tube insertion surgery done on 03/11/21 with Dr Dahlia Byes. She reports that she has been doing well but had some concerns today. She feels like she may have rolled in her sleep and slept on her left side last night. She woke up this morning much more sore than she had been and feels like she has some pressure and swelling on the left side. She denies any shortness of breath, fevers, chills, or cough. She took two Extra strength Tylenol and has been walking around and states that the discomfort is much better. She is concerned about the extensive bruising. Patient assured that this bruising was normal and would get better with time. She was instructed to just monitor for know and let us know if she got any worse or developed any shortness of breath.

## 2021-03-26 ENCOUNTER — Other Ambulatory Visit: Payer: Self-pay

## 2021-03-26 ENCOUNTER — Ambulatory Visit (INDEPENDENT_AMBULATORY_CARE_PROVIDER_SITE_OTHER): Payer: BC Managed Care – PPO | Admitting: Surgery

## 2021-03-26 ENCOUNTER — Encounter: Payer: Self-pay | Admitting: Surgery

## 2021-03-26 VITALS — BP 111/73 | HR 84 | Temp 98.2°F | Ht 64.75 in | Wt 132.0 lb

## 2021-03-26 DIAGNOSIS — Z09 Encounter for follow-up examination after completed treatment for conditions other than malignant neoplasm: Secondary | ICD-10-CM

## 2021-03-26 NOTE — Patient Instructions (Addendum)
Follow up here in 3 months with a chest xray prior-we will send you a letter about this appointment.   GENERAL POST-OPERATIVE PATIENT INSTRUCTIONS   WOUND CARE INSTRUCTIONS: If clothing rubs against the wound or causes irritation and the wound is not draining you may cover it with a dry dressing during the daytime.  Try to keep the wound dry and avoid ointments on the wound unless directed to do so.  If the wound becomes bright red and painful or starts to drain infected material that is not clear, please contact your physician immediately.  If the wound is mildly pink and has a thick firm ridge underneath it, this is normal, and is referred to as a healing ridge.  This will resolve over the next 4-6 weeks.  BATHING: You may shower if you have been informed of this by your surgeon. However, Please do not submerge in a tub, hot tub, or pool until incisions are completely sealed or have been told by your surgeon that you may do so.  DIET:  You may eat any foods that you can tolerate.  It is a good idea to eat a high fiber diet and take in plenty of fluids to prevent constipation.  If you do become constipated you may want to take a mild laxative or take ducolax tablets on a daily basis until your bowel habits are regular.  Constipation can be very uncomfortable, along with straining, after recent surgery.  ACTIVITY: You may want to hug a pillow when coughing and sneezing to add additional support to the surgical area, if you had abdominal or chest surgery, which will decrease pain during these times.  You are encouraged to walk and engage in light activity for the next two weeks.  You should not lift more than 20 pounds for 4-6 weeks after surgery as it could put you at increased risk for complications. Twenty pounds is roughly equivalent to a plastic bag of groceries. At that time- Listen to your body when lifting, if you have pain when lifting, stop and then try again in a few days. Soreness after doing  exercises or activities of daily living is normal as you get back in to your normal routine.  MEDICATIONS:  Try to take narcotic medications and anti-inflammatory medications, such as tylenol, ibuprofen, naprosyn, etc., with food.  This will minimize stomach upset from the medication.  Should you develop nausea and vomiting from the pain medication, or develop a rash, please discontinue the medication and contact your physician.  You should not drive, make important decisions, or operate machinery when taking narcotic pain medication.  SUNBLOCK Use sun block to incision area over the next year if this area will be exposed to sun. This helps decrease scarring and will allow you avoid a permanent darkened area over your incision.  QUESTIONS:  Please feel free to call our office if you have any questions, and we will be glad to assist you.

## 2021-03-26 NOTE — Progress Notes (Signed)
Outpatient Surgical Follow Up  03/26/2021  Chelsea Hicks is an 55 y.o. female.   Chief Complaint  Patient presents with   Routine Post Op    HPI: Chelsea Hicks is 2 weeks out after robotic left diaphragmatic hernia repair with mesh and chest tube placement.  She did very well.  She had some pain after liposomal Marcaine wore off.  No fevers no chills.  She feels a significant difference on her chest heaviness when compared to her condition preoperatively.  She is able to eat and have a normal bowel movement.  She is not taking any narcotics at this time.     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, including shoulder 7989   Complication of anesthesia 1995   vomiting   Crohn's disease ([place]) 2005   Diffuse cystic mastopathy 2013   Herpes zoster without complication 10/09/1939   Hypertrophic lichen planus    Lichen simplex chronicus    PONV (postoperative nausea and vomiting)    Regional enteritis of unspecified site    Renal cell carcinoma, left ([place]) 11/30/2019   Special screening for malignant neoplasms, colon     Past Surgical History:  Procedure Laterality Date   bowel rescetion  07/2009   BREAST BIOPSY Left 02-11-04   core bx,benign   CHEST TUBE INSERTION  03/11/2021   Procedure: CHEST TUBE INSERTION;  Surgeon: Jules Husbands, MD;  Location: [facility] ORS;  Service: General;;   CHOLECYSTECTOMY  12/2001   COLONOSCOPY  2011   Dr. Vira Agar   DILATION AND CURETTAGE OF UTERUS     hemoglobin electrophinesis     LAPAROSCOPIC NEPHRECTOMY, HAND ASSISTED Left 11/30/2019   Procedure: HAND ASSISTED LAPAROSCOPIC NEPHRECTOMY;  Surgeon: Hollice Espy, MD;  Location: [facility] ORS;  Service: Urology;  Laterality: Left;   LIPOMA EXCISION  2009   from back   LIPOMA EXCISION Left 07/18/2012   arm   TUBAL LIGATION     XI ROBOTIC ASSISTED REPAIR OF DIAPHRAGMATIC HERNIA  03/11/2021   Procedure: XI ROBOTIC ASSISTED REPAIR OF DIAPHRAGMATIC HERNIA;  Surgeon: Jules Husbands, MD;  Location: [facility] ORS;  Service: General;;    Family History  Problem Relation Age of Onset   Cancer Maternal Grandfather        prostate   Cancer Paternal Grandmother        breast   Breast cancer Paternal Grandmother    Breast cancer Cousin        mat cousin    Social History:  reports that she has never smoked. She has never used smokeless tobacco. She reports current alcohol use. She reports that she does not use drugs.  Allergies:  Allergies  Allergen Reactions   Amoxicillin-Pot Clavulanate Hives   Penicillins Hives    Did it involve swelling of the face/tongue/throat, SOB, or low BP? No Did it involve sudden or severe rash/hives, skin peeling, or any reaction on the inside of your mouth or nose? No Did you need to seek medical attention at a hospital or doctor's office? No When did it last happen? More than 15 years ago If all above answers are "NO", may proceed with cephalosporin use.    Sulfonamide Derivatives Hives    Medications reviewed.    ROS Full ROS performed and is otherwise negative other than what is stated in HPI   BP 111/73   Pulse 84   Temp 98.2 F (36.8 C)   Ht 5' 4.75" (1.645 m)  Wt 132 lb (59.9 kg)   SpO2 98%   BMI 22.14 kg/m   Physical Exam Vitals and nursing note reviewed. Exam conducted with a chaperone present.  Constitutional:      General: She is not in acute distress.    Appearance: Normal appearance. She is normal weight.  Cardiovascular:     Rate and Rhythm: Normal rate and regular rhythm.     Heart sounds: No murmur heard. Pulmonary:     Effort: Pulmonary effort is normal. No respiratory distress.     Breath sounds: Normal breath sounds. No stridor. No wheezing.  Abdominal:     General: Abdomen is flat. There is no distension.     Palpations: Abdomen is soft. There is no mass.     Tenderness: There is no abdominal tenderness. There is no guarding or rebound.     Hernia: No hernia is present.     Comments:  Abdominal incisions healing well without evidence of infection.  There is some minor ecchymosis.  No peritonitis  Skin:    General: Skin is warm and dry.  Neurological:     General: No focal deficit present.     Mental Status: She is alert and oriented to person, place, and time.  Psychiatric:        Mood and Affect: Mood normal.        Behavior: Behavior normal.        Thought Content: Thought content normal.        Judgment: Judgment normal.       Assessment/Plan: Status post robotic diaphragmatic hernia repair with mesh without complication.  No evidence of recurrence.  We will see her back in about 3 months with a follow-up chest x-ray.     Caroleen Hamman, MD [facility] General Surgeon

## 2021-05-13 ENCOUNTER — Other Ambulatory Visit: Payer: Self-pay

## 2021-05-13 DIAGNOSIS — K449 Diaphragmatic hernia without obstruction or gangrene: Secondary | ICD-10-CM

## 2021-06-16 ENCOUNTER — Ambulatory Visit
Admission: RE | Admit: 2021-06-16 | Discharge: 2021-06-16 | Disposition: A | Discharge status: Home / Self Care | Payer: BC Managed Care – PPO | Source: Ambulatory Visit | Attending: Internal Medicine | Admitting: Internal Medicine

## 2021-06-16 ENCOUNTER — Other Ambulatory Visit: Payer: Self-pay

## 2021-06-16 VITALS — BP 123/79 | HR 82 | Temp 98.1°F | Resp 18 | Ht 64.75 in | Wt 132.1 lb

## 2021-06-16 DIAGNOSIS — J3489 Other specified disorders of nose and nasal sinuses: Secondary | ICD-10-CM | POA: Diagnosis not present

## 2021-06-16 DIAGNOSIS — R0981 Nasal congestion: Secondary | ICD-10-CM

## 2021-06-16 MED ORDER — METHYLPREDNISOLONE 4 MG PO TBPK: ORAL_TABLET | ORAL | 0 refills | Status: DC

## 2021-06-16 MED ORDER — PSEUDOEPHEDRINE HCL ER 120 MG PO TB12: 120.0000 mg | ORAL_TABLET | Freq: Two times a day (BID) | ORAL | 0 refills | Status: DC

## 2021-06-16 NOTE — ED Provider Notes (Signed)
[facility]    CSN: [id number] Arrival date & time: 06/16/21  1550      History   Chief Complaint Chief Complaint  Patient presents with   Nasal Congestion   Facial Pain    HPI Chelsea Hicks is a 55 y.o. female who presents with onset of sneezing, then L nose congestion x 4 days  and tearing of L eye and started having L face pain and presseure since yesterday. She has tried allergy get and OTC decongestants which only helped for a short time. Tylenol has not helped at all. Today noticed green mucous, when it was only clear initially. She denies fever, chills or aches. One day had sweats for a short time and did not come back.  She also feels pressure in her L ear and neck. Declines covid testing. Denies having a fever. Denies hx of nose polyps or sinus surgery.     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, including shoulder 0865   Complication of anesthesia 1995   vomiting   Crohn's disease ([place]) 2005   Diffuse cystic mastopathy 2013   Herpes zoster without complication 03/15/4695   Hypertrophic lichen planus    Lichen simplex chronicus    PONV (postoperative nausea and vomiting)    Regional enteritis of unspecified site    Renal cell carcinoma, left ([place]) 11/30/2019   Special screening for malignant neoplasms, colon     Patient Active Problem List   Diagnosis Date Noted   Diaphragmatic hernia 02/13/2021   Renal cell carcinoma, left ([place]) 11/30/2019   Crohn's disease ([place]) 07/21/2016   Preventative health care 09/24/2014   PMS (premenstrual syndrome) 09/24/2014   Paresthesia of foot 08/10/2013   Fibrocystic breast disease 07/06/2013   Deficiency anemia 12/14/2007   Regional enteritis ([place]) 12/06/2001   HYPERGLYCEMIA 05/08/2000    Past Surgical History:  Procedure Laterality Date   bowel rescetion  07/2009   BREAST BIOPSY Left 02-11-04   core bx,benign   CHEST TUBE INSERTION  03/11/2021   Procedure: CHEST  TUBE INSERTION;  Surgeon: Jules Husbands, MD;  Location: [facility] OR[facility];  Service: General;;   CHOLECYSTECTOMY  12/2001   COLONOSCOPY  2011   Dr. Vira Agar   DILATION AND CURETTAGE OF UTERUS     hemoglobin electrophinesis     LAPAROSCOPIC NEPHRECTOMY, HAND ASSISTED Left 11/30/2019   Procedure: HAND ASSISTED LAPAROSCOPIC NEPHRECTOMY;  Surgeon: Hollice Espy, MD;  Location: [facility];  Service: Urology;  Laterality: Left;   LIPOMA EXCISION  2009   from back   LIPOMA EXCISION Left 07/18/2012   arm   TUBAL LIGATION     XI ROBOTIC ASSISTED REPAIR OF DIAPHRAGMATIC HERNIA  03/11/2021   Procedure: XI ROBOTIC ASSISTED REPAIR OF DIAPHRAGMATIC HERNIA;  Surgeon: Jules Husbands, MD;  Location: [facility];  Service: General;;    OB History     Gravida  3   Para      Term      Preterm      AB  1   Living  2      SAB  1   IAB      Ectopic      Multiple      Live Births           Obstetric Comments  Age with first menstruation-14 Age with first pregnancy-29          Home Medications    Prior to Admission  medications   Medication Sig Start Date End Date Taking? Authorizing Provider  azaTHIOprine (IMURAN) 50 MG tablet Take 150 mg by mouth daily.   Yes [provider]  ferrous sulfate 325 (65 FE) MG tablet Take 325 mg by mouth daily with breakfast.   Yes [provider]  Multiple Vitamin (MULTIVITAMIN WITH MINERALS) TABS tablet Take 1 tablet by mouth daily.   Yes [provider]  pantoprazole (PROTONIX) 40 MG tablet Take 40 mg by mouth daily. 11/18/20 11/18/21 Yes [provider]  Vitamin D3 (VITAMIN D) 25 MCG tablet Take 1,000 Units by mouth daily.   Yes [provider]    Family History Family History  Problem Relation Age of Onset   Cancer Maternal Grandfather        prostate   Cancer Paternal Grandmother        breast   Breast cancer Paternal Grandmother    Breast cancer Cousin        mat cousin    Social History Social  History   Tobacco Use   Smoking status: Never   Smokeless tobacco: Never  Vaping Use   Vaping Use: Never used  Substance Use Topics   Alcohol use: Yes    Alcohol/week: 0.0 standard drinks    Comment: occasionally   Drug use: No     Allergies   Amoxicillin-pot clavulanate, Penicillins, and Sulfonamide derivatives   Review of Systems Review of Systems  Constitutional:  Negative for chills and fever.  HENT:  Positive for congestion, ear pain and sinus pain. Negative for ear discharge, facial swelling, sore throat and trouble swallowing.   Musculoskeletal:  Positive for neck pain. Negative for myalgias and neck stiffness.  Skin:  Negative for rash.  Neurological:  Negative for headaches.    Physical Exam Triage Vital Signs ED Triage Vitals  Enc Vitals Group     BP 06/16/21 1652 123/79     Pulse Rate 06/16/21 1652 82     Resp 06/16/21 1652 18     Temp 06/16/21 1652 98.1 F (36.7 C)     Temp Source 06/16/21 1652 Oral     SpO2 06/16/21 1652 100 %     Weight 06/16/21 1649 132 lb 0.9 oz (59.9 kg)     Height 06/16/21 1649 5' 4.75" (1.645 m)     Head Circumference --      Peak Flow --      Pain Score 06/16/21 1649 6     Pain Loc --      Pain Edu? --      Excl. in [place]? --    No data found.  Updated Vital Signs BP 123/79 (BP Location: Left Arm)   Pulse 82   Temp 98.1 F (36.7 C) (Oral)   Resp 18   Ht 5' 4.75" (1.645 m)   Wt 132 lb 0.9 oz (59.9 kg)   SpO2 100%   BMI 22.15 kg/m   Visual Acuity Right Eye Distance:   Left Eye Distance:   Bilateral Distance:    Right Eye Near:   Left Eye Near:    Bilateral Near:     Physical Exam Vitals and nursing note reviewed.  Constitutional:      General: She is not in acute distress.    Appearance: She is normal weight. She is not toxic-appearing.  HENT:     Head: Normocephalic.     Right Ear: Tympanic membrane, ear canal and external ear normal.     Left Ear:  Tympanic membrane, ear canal and external ear normal.      Ears:     Comments: Has mild dullness on the L upper TM    Nose: Congestion and rhinorrhea present.     Comments: She has moderate swelling of her L side, and I cant see too far All her sinuses have good translumination    Mouth/Throat:     Mouth: Mucous membranes are moist.     Pharynx: Oropharynx is clear.  Eyes:     General: No scleral icterus.    Conjunctiva/sclera: Conjunctivae normal.  Neck:     Comments: L sternocleidomastoid muscle is a little tender Cardiovascular:     Rate and Rhythm: Normal rate and regular rhythm.  Pulmonary:     Effort: Pulmonary effort is normal.     Breath sounds: Normal breath sounds.  Musculoskeletal:        General: Normal range of motion.     Cervical back: Neck supple.  Skin:    General: Skin is warm and dry.     Findings: No rash.  Neurological:     Mental Status: She is alert and oriented to person, place, and time.     Gait: Gait normal.  Psychiatric:        Mood and Affect: Mood normal.        Behavior: Behavior normal.        Thought Content: Thought content normal.        Judgment: Judgment normal.     UC Treatments / Results  Labs (all labs ordered are listed, but only abnormal results are displayed) Labs Reviewed - No data to display  EKG   Radiology No results found.  Procedures Procedures (including critical care time)  Medications Ordered in UC Medications - No data to display  Initial Impression / Assessment and Plan / UC Course  I have reviewed the triage vital signs and the nursing notes. Sinus pain of L maxillary without suspect of bacterial sinusitis.  I placed her on Medrol dose pack and Sudafed as noted I explained to her she may have a nose polyp which I cant see and I would like her to FU with ENT. See instructions.  Final Clinical Impressions(s) / UC Diagnoses   Final diagnoses:  None   Discharge Instructions   None    ED Prescriptions   None    PDMP not reviewed this encounter.    Shelby Mattocks, PA-C 06/16/21 1723

## 2021-06-16 NOTE — ED Triage Notes (Addendum)
Pt c/o nasal congestion, left sided facial pain and pressure. Started about 4 days ago. She states she can feel pressure in her left ear and neck as well. Denies fever. Declines covid testing at this time.

## 2021-06-16 NOTE — Discharge Instructions (Signed)
Monitor your temperature and if you get 100.5 or more you may have developed a bacterial infection. Right now you only have inflammation of your sinuses. But see ENT to see if you may have a polyp

## 2021-06-20 NOTE — Telephone Encounter (Signed)
Notes in chart from visit from urgent care.

## 2021-06-24 ENCOUNTER — Ambulatory Visit
Admission: RE | Admit: 2021-06-24 | Discharge: 2021-06-24 | Disposition: A | Discharge status: Home / Self Care | Payer: BC Managed Care – PPO | Attending: Surgery | Admitting: Surgery

## 2021-06-24 ENCOUNTER — Ambulatory Visit
Admission: RE | Admit: 2021-06-24 | Discharge: 2021-06-24 | Disposition: A | Discharge status: Home / Self Care | Payer: BC Managed Care – PPO | Source: Ambulatory Visit | Attending: Urology | Admitting: Urology

## 2021-06-24 DIAGNOSIS — C642 Malignant neoplasm of left kidney, except renal pelvis: Secondary | ICD-10-CM | POA: Diagnosis not present

## 2021-06-24 IMAGING — CR DG CHEST 1V
1 series · 1 of 1 positions shown · non-contrast
Comparison: 03/12/2021

CLINICAL DATA: Renal cell carcinoma

EXAM:
CHEST  1 VIEW

[dg chest 1 view]
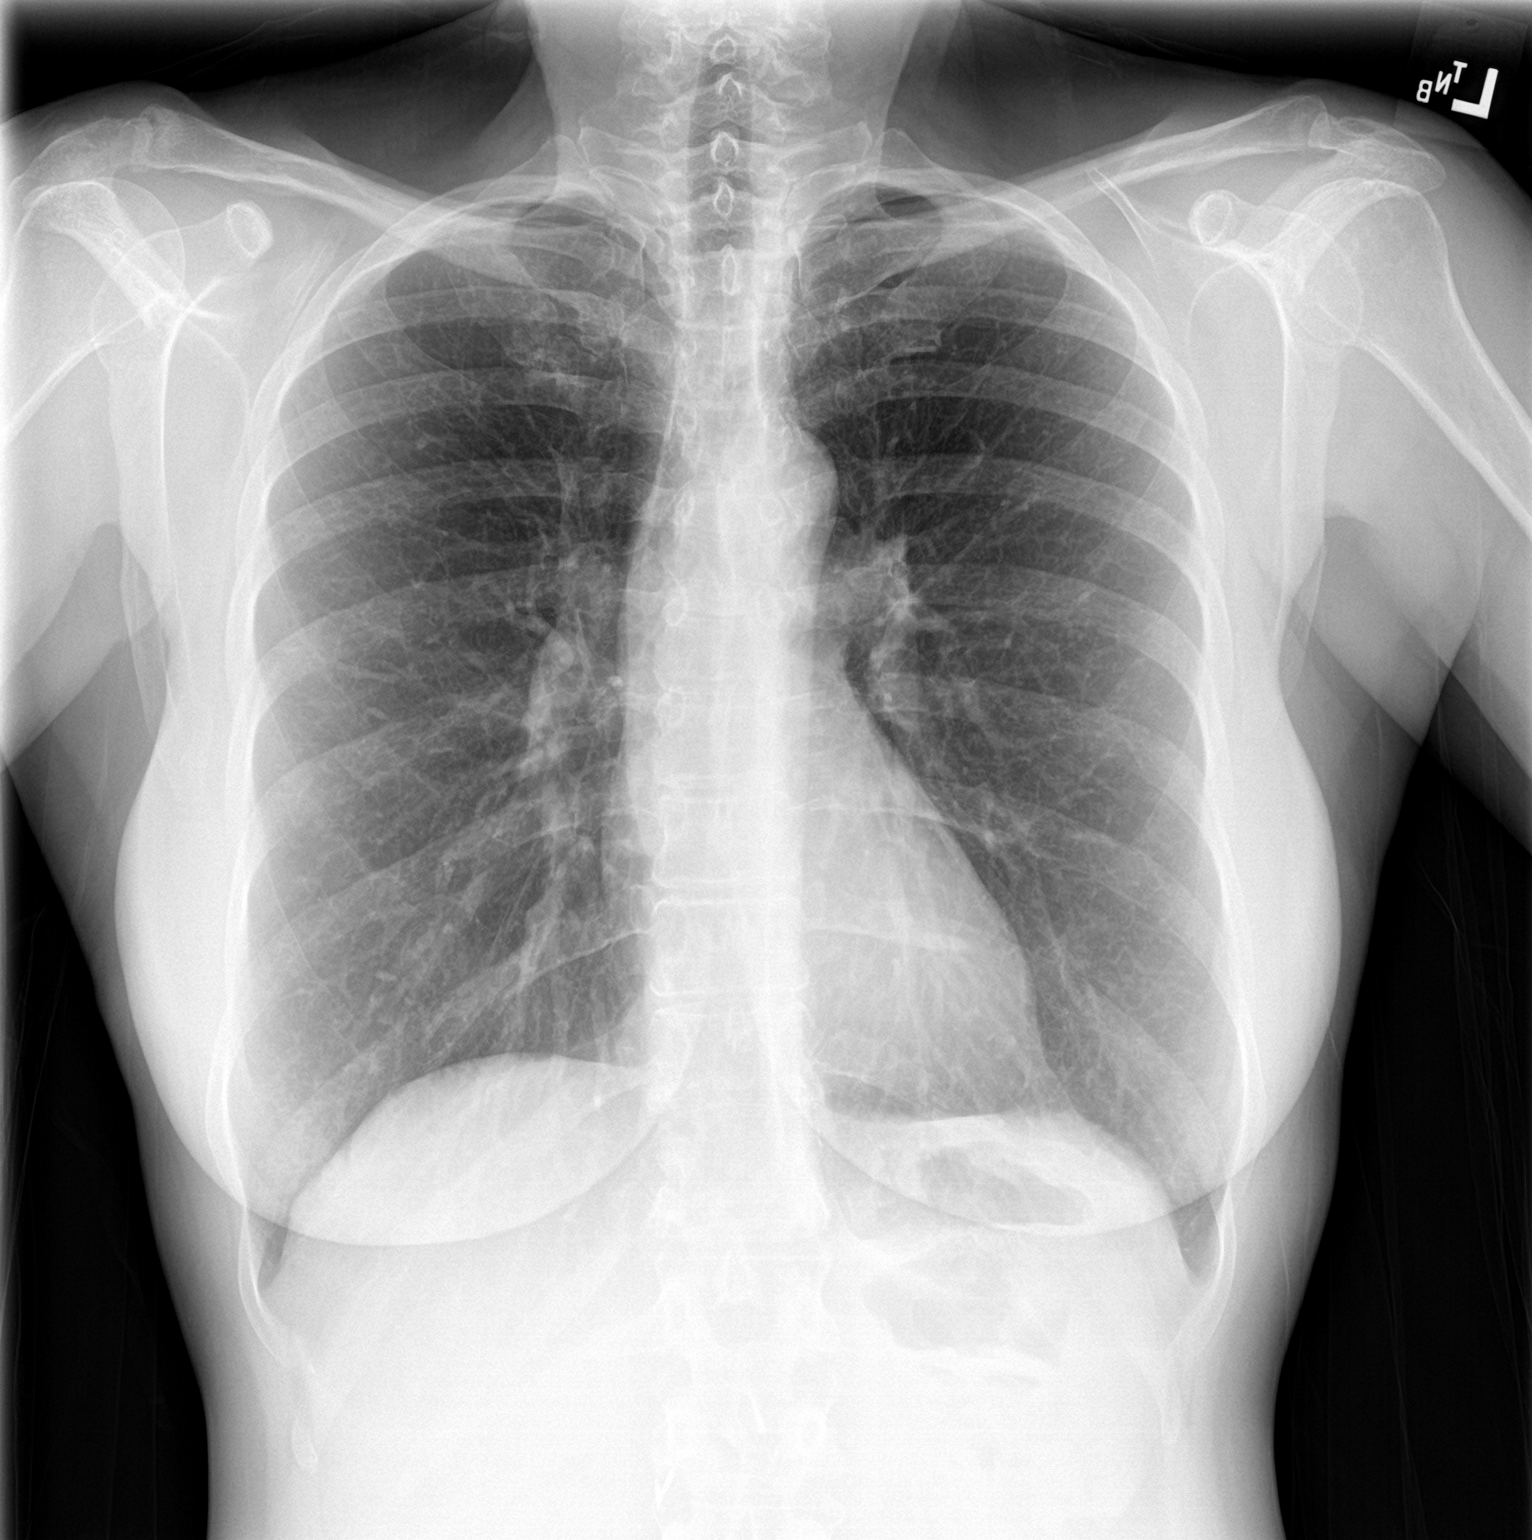

[1 of 1 positions shown; findings below may reference images not displayed]

FINDINGS: The heart size and mediastinal contours are within normal limits.
Both lungs are clear. The visualized skeletal structures are
unremarkable.
IMPRESSION: No active disease.

## 2021-06-25 ENCOUNTER — Ambulatory Visit: Payer: BC Managed Care – PPO | Admitting: Surgery

## 2021-06-25 ENCOUNTER — Encounter: Payer: Self-pay | Admitting: Surgery

## 2021-06-25 ENCOUNTER — Other Ambulatory Visit: Payer: Self-pay

## 2021-06-25 VITALS — BP 124/80 | HR 108 | Temp 98.2°F | Ht 64.45 in | Wt 135.6 lb

## 2021-06-25 DIAGNOSIS — K440 Diaphragmatic hernia with obstruction, without gangrene: Secondary | ICD-10-CM

## 2021-06-25 DIAGNOSIS — Z09 Encounter for follow-up examination after completed treatment for conditions other than malignant neoplasm: Secondary | ICD-10-CM | POA: Diagnosis not present

## 2021-06-25 NOTE — Patient Instructions (Signed)
Please call our office if you have any questions or concerns.  

## 2021-06-26 ENCOUNTER — Telehealth: Payer: Self-pay | Admitting: *Deleted

## 2021-06-26 ENCOUNTER — Encounter: Payer: Self-pay | Admitting: Surgery

## 2021-06-26 NOTE — Progress Notes (Signed)
Outpatient Surgical Follow Up  06/26/2021  Chelsea Hicks is an 55 y.o. female.   Chief Complaint  Patient presents with   Follow-up    Diaphragmatic hernia     HPI: Chelsea Hicks is a 55 year old female status post robotic repair of her left diaphragmatic hernia with mesh 3 1/2 months ago.  She is doing very well. . No fevers no chills no dysphagia.  No shortness of breath and her prior preoperative symptoms have completely resolved.  She did have a chest x-ray yesterday that have personally reviewed showing significant improvement on left diaphragm consistent with intact repair of her left diaphragm.  She is very appreciative and is completely asymptomatic and back to baseline.  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, including shoulder 2482   Complication of anesthesia 1995   vomiting   Crohn's disease ([place]) 2005   Diffuse cystic mastopathy 2013   Herpes zoster without complication 5/0/0370   Hypertrophic lichen planus    Lichen simplex chronicus    PONV (postoperative nausea and vomiting)    Regional enteritis of unspecified site    Renal cell carcinoma, left ([place]) 11/30/2019   Special screening for malignant neoplasms, colon     Past Surgical History:  Procedure Laterality Date   bowel rescetion  07/2009   BREAST BIOPSY Left 02-11-04   core bx,benign   CHEST TUBE INSERTION  03/11/2021   Procedure: CHEST TUBE INSERTION;  Surgeon: Jules Husbands, MD;  Location: [facility];  Service: General;;   CHOLECYSTECTOMY  12/2001   COLONOSCOPY  2011   Dr. Vira Agar   DILATION AND CURETTAGE OF UTERUS     hemoglobin electrophinesis     LAPAROSCOPIC NEPHRECTOMY, HAND ASSISTED Left 11/30/2019   Procedure: HAND ASSISTED LAPAROSCOPIC NEPHRECTOMY;  Surgeon: Hollice Espy, MD;  Location: [facility];  Service: Urology;  Laterality: Left;   LIPOMA EXCISION  2009   from back   LIPOMA EXCISION Left 07/18/2012   arm   TUBAL LIGATION     XI ROBOTIC ASSISTED  REPAIR OF DIAPHRAGMATIC HERNIA  03/11/2021   Procedure: XI ROBOTIC ASSISTED REPAIR OF DIAPHRAGMATIC HERNIA;  Surgeon: Jules Husbands, MD;  Location: [facility];  Service: General;;    Family History  Problem Relation Age of Onset   Cancer Maternal Grandfather        prostate   Cancer Paternal Grandmother        breast   Breast cancer Paternal Grandmother    Breast cancer Cousin        mat cousin    Social History:  reports that she has never smoked. She has never used smokeless tobacco. She reports current alcohol use. She reports that she does not use drugs.  Allergies:  Allergies  Allergen Reactions   Amoxicillin-Pot Clavulanate Hives   Penicillins Hives    Did it involve swelling of the face/tongue/throat, SOB, or low BP? No Did it involve sudden or severe rash/hives, skin peeling, or any reaction on the inside of your mouth or nose? No Did you need to seek medical attention at a hospital or doctor's office? No When did it last happen? More than 15 years ago If all above answers are "NO", may proceed with cephalosporin use.    Sulfonamide Derivatives Hives    Medications reviewed.    ROS Full ROS performed and is otherwise negative other than what is stated in HPI   BP 124/80   Pulse (!) 108  Temp 98.2 F (36.8 C) (Oral)   Ht 5' 4.45" (1.637 m)   Wt 135 lb 9.6 oz (61.5 kg)   SpO2 97%   BMI 22.95 kg/m   Physical Exam Vitals and nursing note reviewed. Exam conducted with a chaperone present.  Constitutional:      Appearance: Normal appearance. She is normal weight.  Cardiovascular:     Rate and Rhythm: Normal rate and regular rhythm.  Pulmonary:     Effort: Pulmonary effort is normal. No respiratory distress.     Breath sounds: Normal breath sounds. No stridor. No wheezing or rhonchi.  Chest:     Chest wall: No tenderness.  Abdominal:     General: Abdomen is flat. There is no distension.     Palpations: Abdomen is soft. There is no mass.     Tenderness:  There is no abdominal tenderness. There is no guarding.     Hernia: No hernia is present.  Musculoskeletal:        General: No swelling or tenderness. Normal range of motion.     Cervical back: Normal range of motion and neck supple. No rigidity.  Skin:    General: Skin is warm and dry.     Capillary Refill: Capillary refill takes less than 2 seconds.     Coloration: Skin is not jaundiced.  Neurological:     General: No focal deficit present.     Mental Status: She is alert and oriented to person, place, and time.  Psychiatric:        Mood and Affect: Mood normal.        Behavior: Behavior normal.        Thought Content: Thought content normal.        Judgment: Judgment normal.    Assessment/Plan: 55 year old female status post left diaphragmatic hernia repair with mesh.  Doing very well no evidence of recurrence on clinical exam or x-ray findings.  No need for any further follow-up regarding general surgery standpoint of view.  She may follow-up with urology for her routine screening. Time spent in this encounter was greater than 30 minutes to include personally reviewing the x-ray, reconciling medications and counseling the patient.   Caroleen Hamman, MD [facility] General Surgeon

## 2021-06-26 NOTE — Telephone Encounter (Addendum)
Left patient a VM asked to return my call to discuss further  ----- Message from Hollice Espy, MD sent at 06/25/2021  5:14 PM EDT ----- I cannot quite figure out why this patient had her chest x-ray done today but it looks fine.  She was post to see me in 1 year from her last visit which is about 6 months from now with a CT abdomen pelvis as well as chest x-ray.  Hollice Espy, MD

## 2021-06-26 NOTE — Telephone Encounter (Signed)
Patient returned call-patient had an office visit with Dr. Dahlia Byes yesterday and he ordered a CXR. It was ordered under your name instead of his. Future orders are still present for her CXR and CT.

## 2021-12-09 ENCOUNTER — Other Ambulatory Visit: Payer: Self-pay | Admitting: Primary Care

## 2021-12-09 DIAGNOSIS — Z1231 Encounter for screening mammogram for malignant neoplasm of breast: Secondary | ICD-10-CM

## 2021-12-25 ENCOUNTER — Encounter: Payer: BC Managed Care – PPO | Admitting: Primary Care

## 2021-12-30 ENCOUNTER — Ambulatory Visit
Admission: RE | Admit: 2021-12-30 | Discharge: 2021-12-30 | Disposition: A | Discharge status: Home / Self Care | Payer: BC Managed Care – PPO | Source: Ambulatory Visit | Attending: Primary Care | Admitting: Primary Care

## 2021-12-30 DIAGNOSIS — Z1231 Encounter for screening mammogram for malignant neoplasm of breast: Secondary | ICD-10-CM

## 2021-12-31 ENCOUNTER — Other Ambulatory Visit: Payer: Self-pay | Admitting: Primary Care

## 2021-12-31 DIAGNOSIS — R928 Other abnormal and inconclusive findings on diagnostic imaging of breast: Secondary | ICD-10-CM

## 2021-12-31 DIAGNOSIS — N6489 Other specified disorders of breast: Secondary | ICD-10-CM

## 2022-01-06 ENCOUNTER — Other Ambulatory Visit: Payer: Self-pay

## 2022-01-06 ENCOUNTER — Ambulatory Visit
Admission: RE | Admit: 2022-01-06 | Discharge: 2022-01-06 | Disposition: A | Discharge status: Home / Self Care | Payer: BC Managed Care – PPO | Source: Ambulatory Visit | Attending: Urology | Admitting: Urology

## 2022-01-06 DIAGNOSIS — C642 Malignant neoplasm of left kidney, except renal pelvis: Secondary | ICD-10-CM

## 2022-01-06 IMAGING — CR DG CHEST 1V
1 series · 1 of 1 positions shown · non-contrast
Comparison: June 24, 2021

CLINICAL DATA: Renal cell carcinoma

EXAM:
CHEST  1 VIEW

[w chest pa]
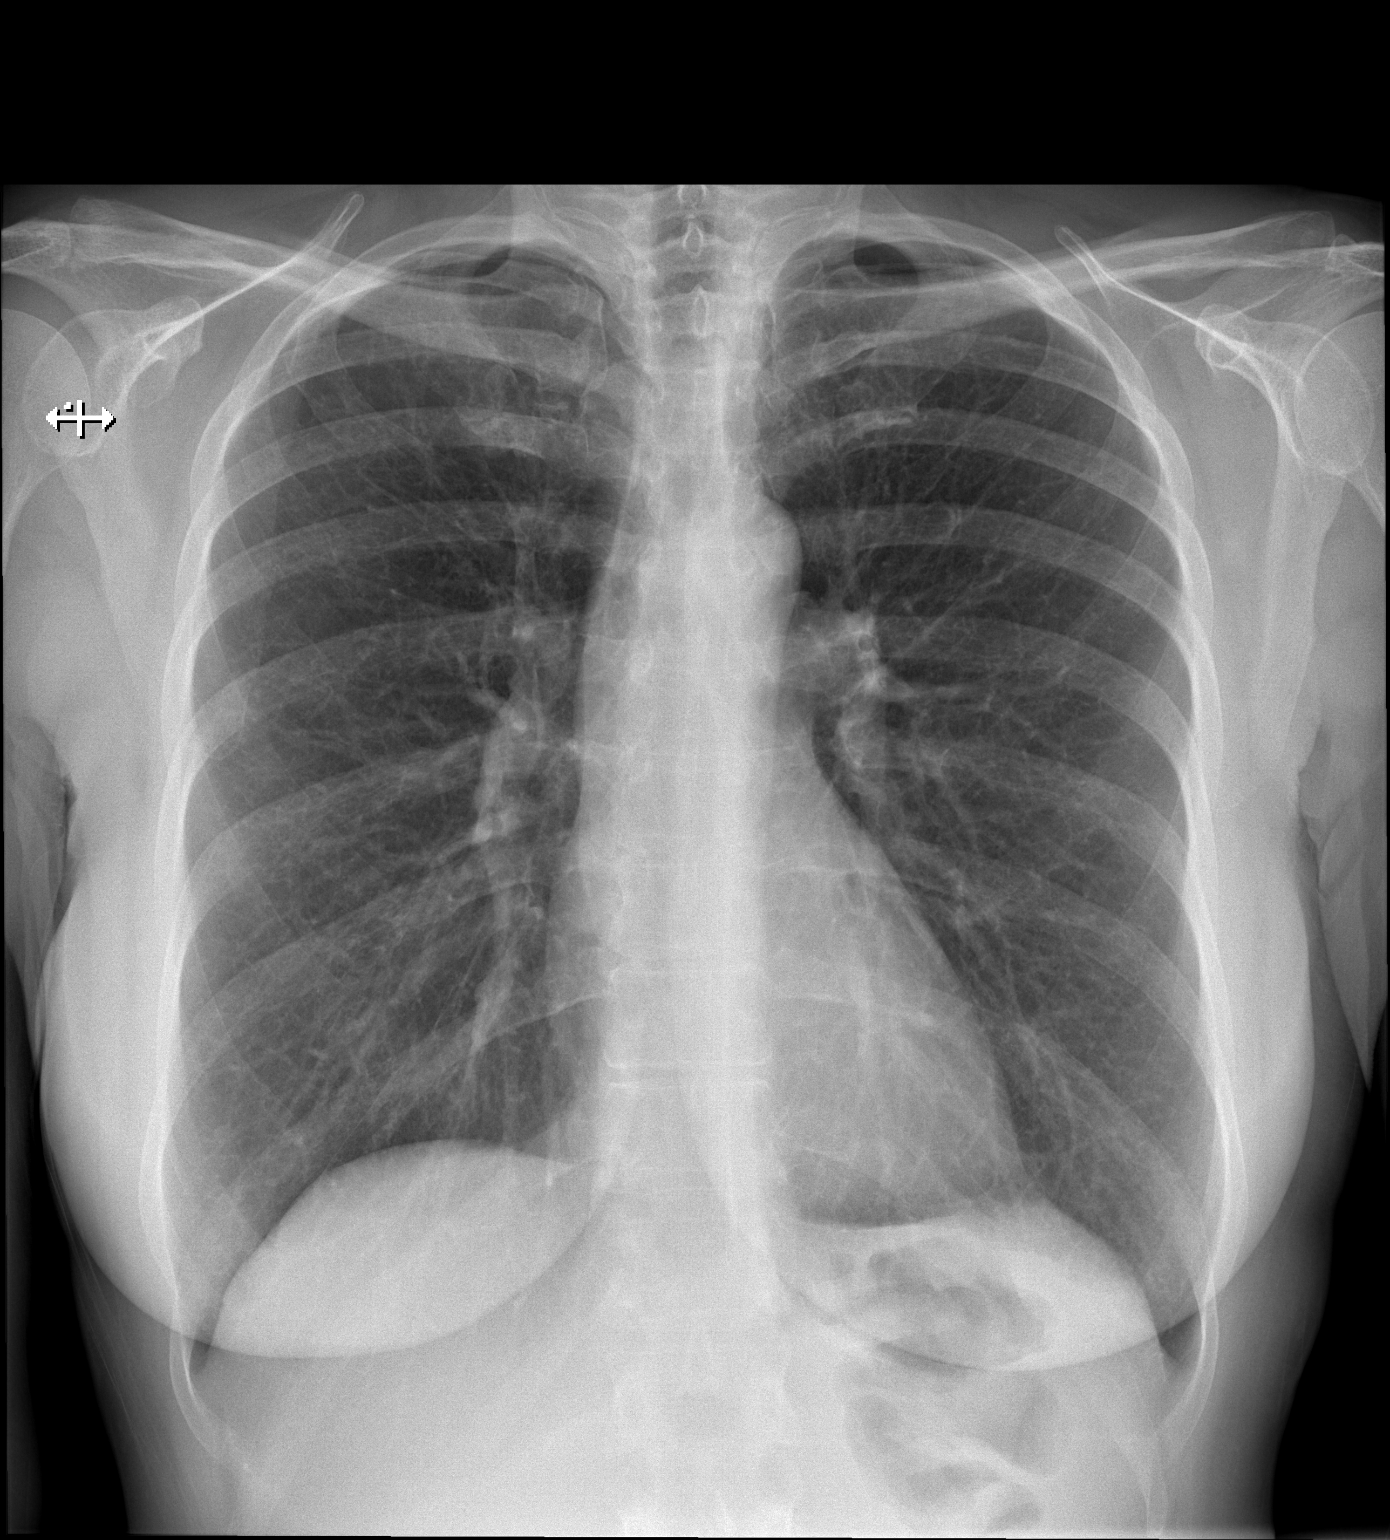

[1 of 1 positions shown; findings below may reference images not displayed]

FINDINGS: The heart size and mediastinal contours are within normal limits.
Both lungs are clear. The visualized skeletal structures are
unremarkable.
IMPRESSION: No active disease.

## 2022-01-06 IMAGING — CT CT ABD-PELV W/ CM
2 of 5 series · 16 of 46 positions shown, 18 images · IV contrast (APPLIED)
Comparison: [DATE] [DATE], [DATE], [DATE] [DATE], [DATE]

CLINICAL DATA: Urologic cancer surveillance.  Left renal cancer.

EXAM:
CT ABDOMEN AND PELVIS WITH CONTRAST
TECHNIQUE: Multidetector CT imaging of the abdomen and pelvis was performed
using the standard protocol following bolus administration of
intravenous contrast.

[Series 2: routine abd/pel with · axial · 0.78mm/px · z∈[+528,+918]mm · 13 of 88 slices shown, 15 images]
[im 5/88  soft-tissue]
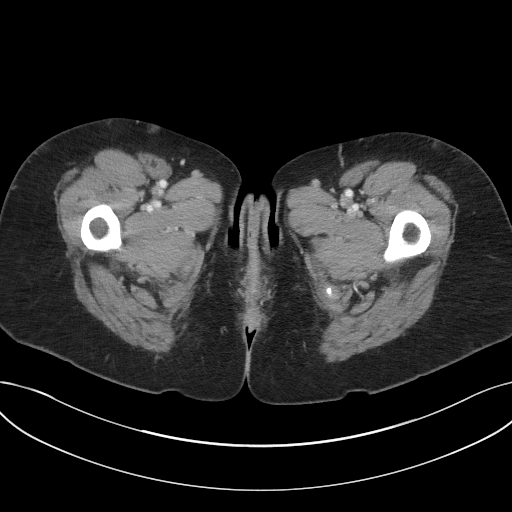
[im 5/88  bone]
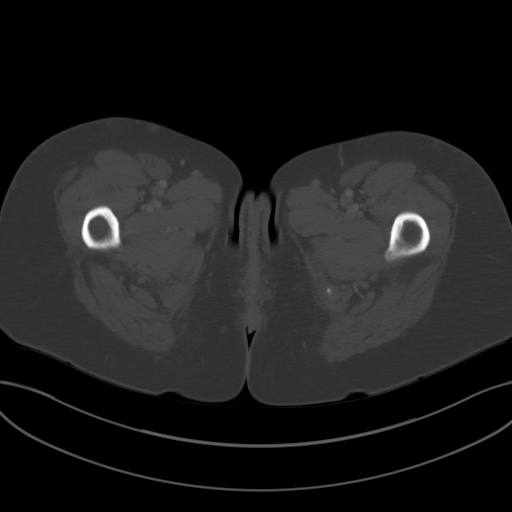
[im 14/88  soft-tissue]
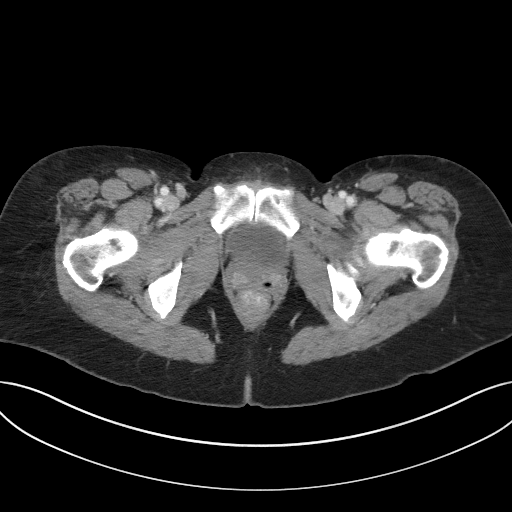
[im 18/88  soft-tissue]
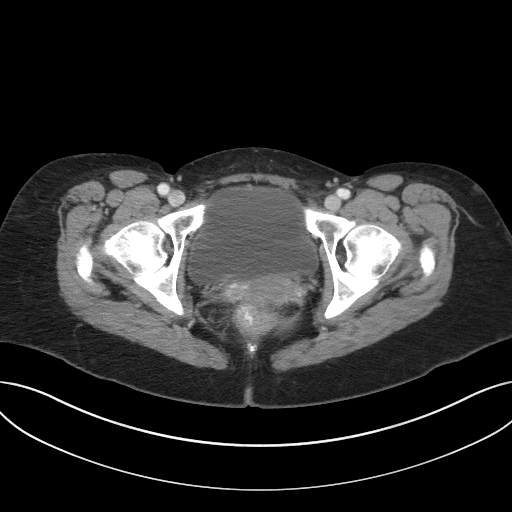
[im 27/88  soft-tissue]
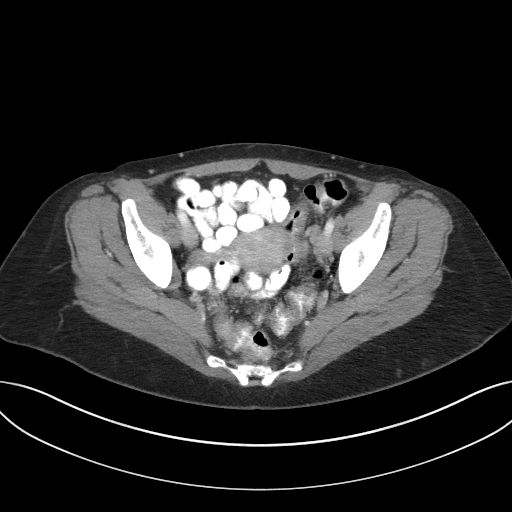
[im 31/88  soft-tissue]
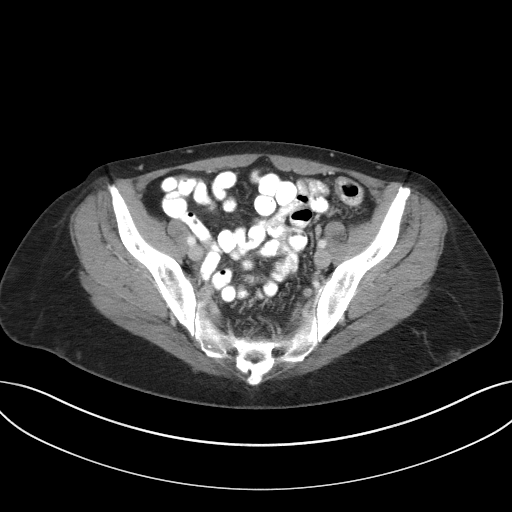
[im 40/88  soft-tissue]
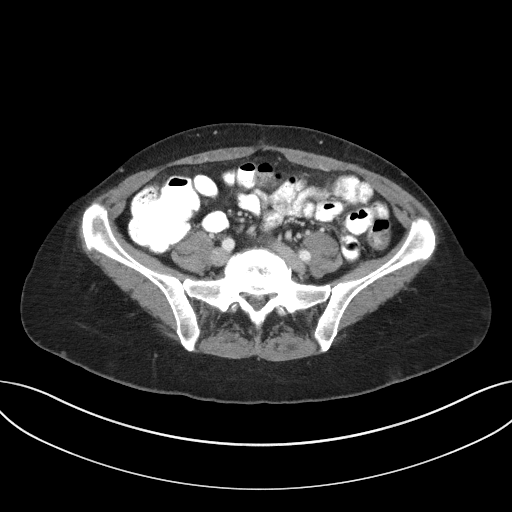
[im 44/88  soft-tissue]
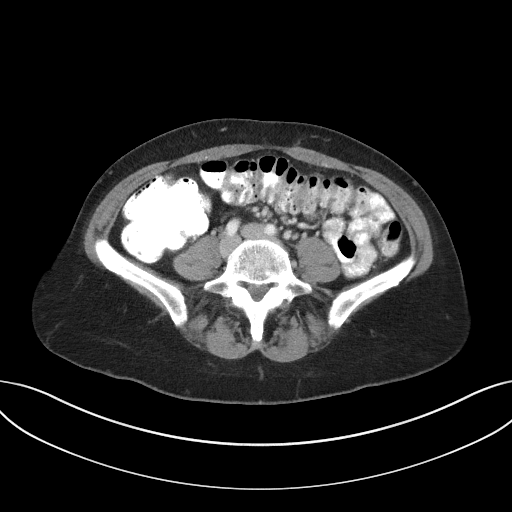
[im 48/88  soft-tissue]
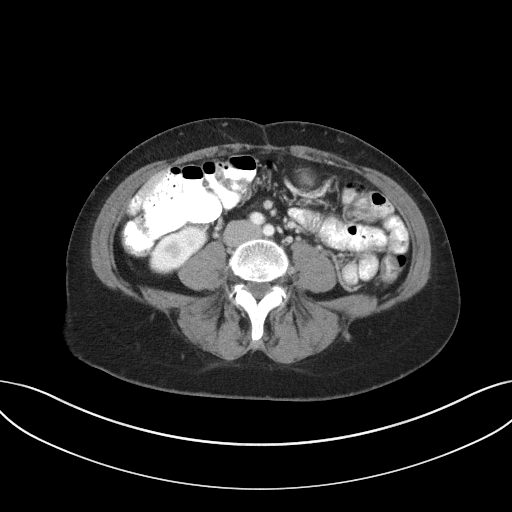
[im 57/88  soft-tissue]
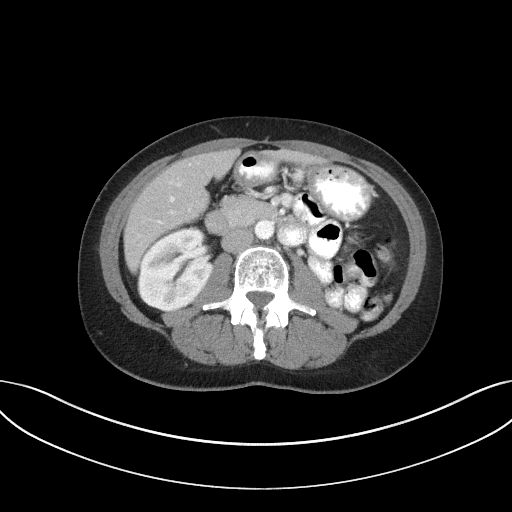
[im 57/88  bone]
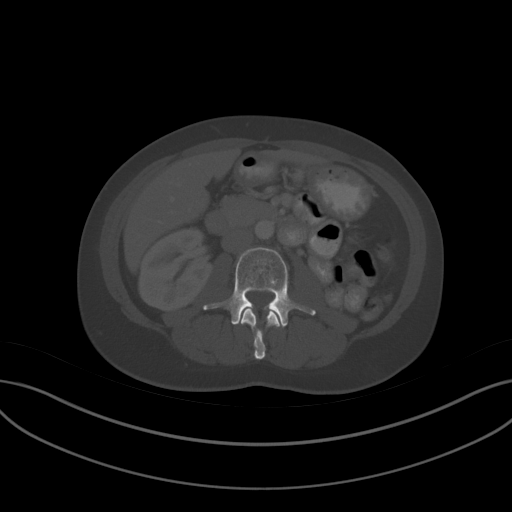
[im 61/88  soft-tissue]
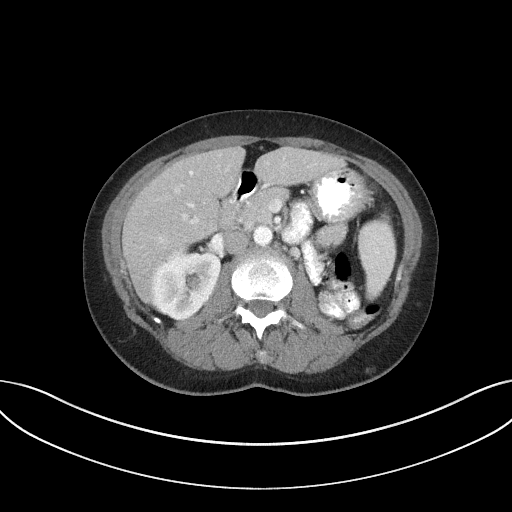
[im 70/88  soft-tissue]
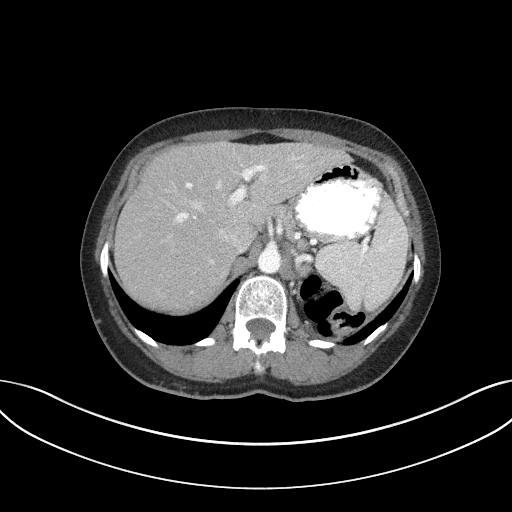
[im 74/88  soft-tissue]
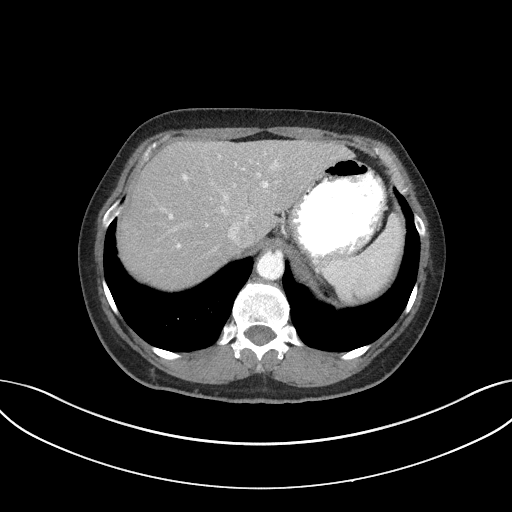
[im 83/88  soft-tissue]
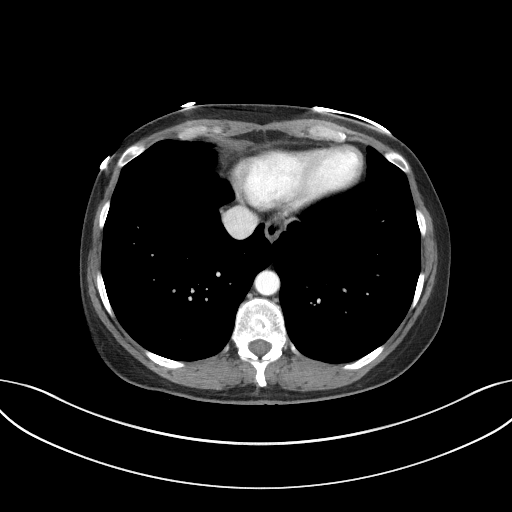

[Series 5: coronal st · coronal · 0.73mm/px · 3 of 79 slices shown]
[im 27/79  soft-tissue]
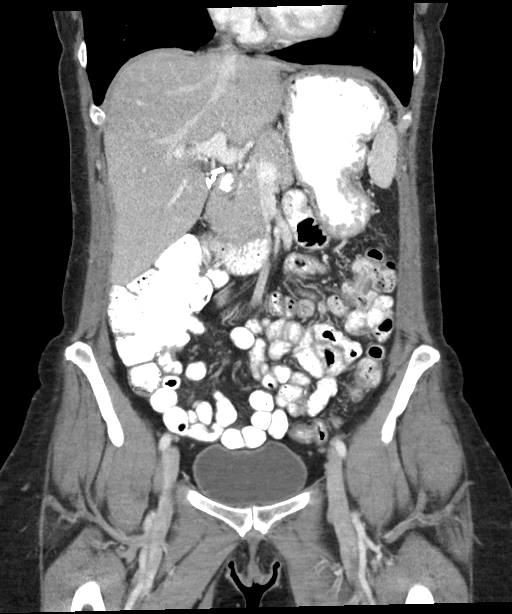
[im 35/79  soft-tissue]
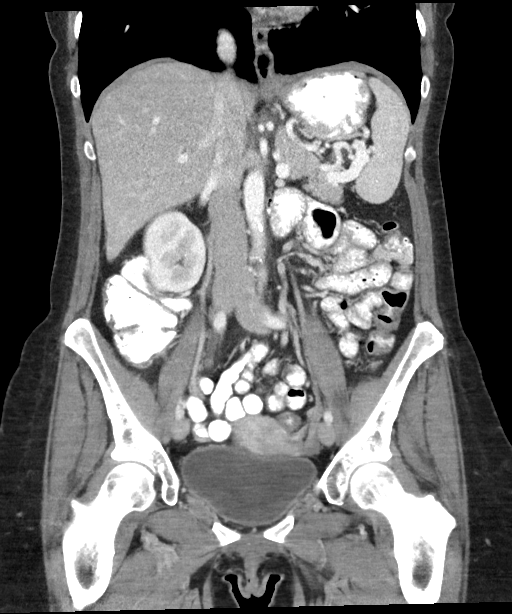
[im 44/79  soft-tissue]
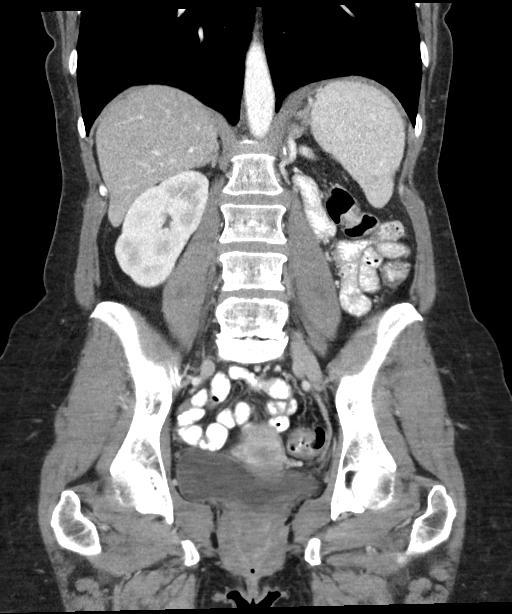

[16 of 46 positions shown; findings below may reference images not displayed]

RADIATION DOSE REDUCTION: This exam was performed according to the
departmental dose-optimization program which includes automated
exposure control, adjustment of the mA and/or kV according to
patient size and/or use of iterative reconstruction technique.

CONTRAST:  100mL OMNIPAQUE IOHEXOL 300 MG/ML  SOLN
FINDINGS: Lower chest: No acute abnormality. Previously noted small
pleural-parenchymal nodularity at the inferior lingula image 8
series 4 is unchanged.

Hepatobiliary: No focal liver abnormality is seen. Status post
cholecystectomy. No biliary dilatation.

Pancreas: Unremarkable. No pancreatic ductal dilatation or
surrounding inflammatory changes.

Spleen: Normal in size without focal abnormality.

Adrenals/Urinary Tract: Status post left nephrectomy. Adrenal glands
are normal. No nodularity identified in the nephrectomy bed. Bladder
is normal.

Stomach/Bowel: Small hiatal hernia is noted. Stomach is otherwise
within normal limits. The appendix is not seen. No evidence of bowel
wall thickening, distention, or inflammatory changes.

Vascular/Lymphatic: No significant vascular findings are present. No
enlarged abdominal or pelvic lymph nodes.

Reproductive: Uterine fibroids are noted. Bilateral adnexa are
normal.

Other: No free fluid

Musculoskeletal: Small focal sclerosis is identified in the T10
vertebral body unchanged compared prior exam 2825. Degenerative
joint changes the L5-S1 level are identified unchanged.
IMPRESSION: 1. No evidence of renal cell carcinoma recurrence post left
nephrectomy. No nodularity identified in the nephrectomy bed.
2. No evidence of metastatic disease in the abdomen and pelvis.

## 2022-01-06 MED ORDER — IOHEXOL 300 MG/ML  SOLN
100.0000 mL | Freq: Once | INTRAMUSCULAR | Status: AC | PRN
  Administered 2022-01-06: 100 mL via INTRAVENOUS

## 2022-01-14 ENCOUNTER — Ambulatory Visit
Admission: RE | Admit: 2022-01-14 | Discharge: 2022-01-14 | Disposition: A | Discharge status: Home / Self Care | Payer: BC Managed Care – PPO | Source: Ambulatory Visit | Attending: Primary Care | Admitting: Primary Care

## 2022-01-14 DIAGNOSIS — N6489 Other specified disorders of breast: Secondary | ICD-10-CM

## 2022-01-14 DIAGNOSIS — R928 Other abnormal and inconclusive findings on diagnostic imaging of breast: Secondary | ICD-10-CM | POA: Insufficient documentation

## 2022-01-14 IMAGING — US US BREAST*L* LIMITED INC AXILLA
1 series · 6 of 6 positions shown · non-contrast
Comparison: Previous exam(s).

CLINICAL DATA: Recall from screening mammography, possible mass or
focal asymmetry involving the upper LEFT breast at posterior depth.

EXAM:
DIGITAL DIAGNOSTIC UNILATERAL LEFT MAMMOGRAM WITH TOMOSYNTHESIS AND
CAD; ULTRASOUND LEFT BREAST LIMITED
TECHNIQUE: Left digital diagnostic mammography and breast tomosynthesis was
performed. The images were evaluated with computer-aided detection.;
Targeted ultrasound examination of the left breast was performed.

[Series 1: us breast*left* limited inc axilla · 0.05mm/px · 6 of 6 slices shown]
[im 1/6]
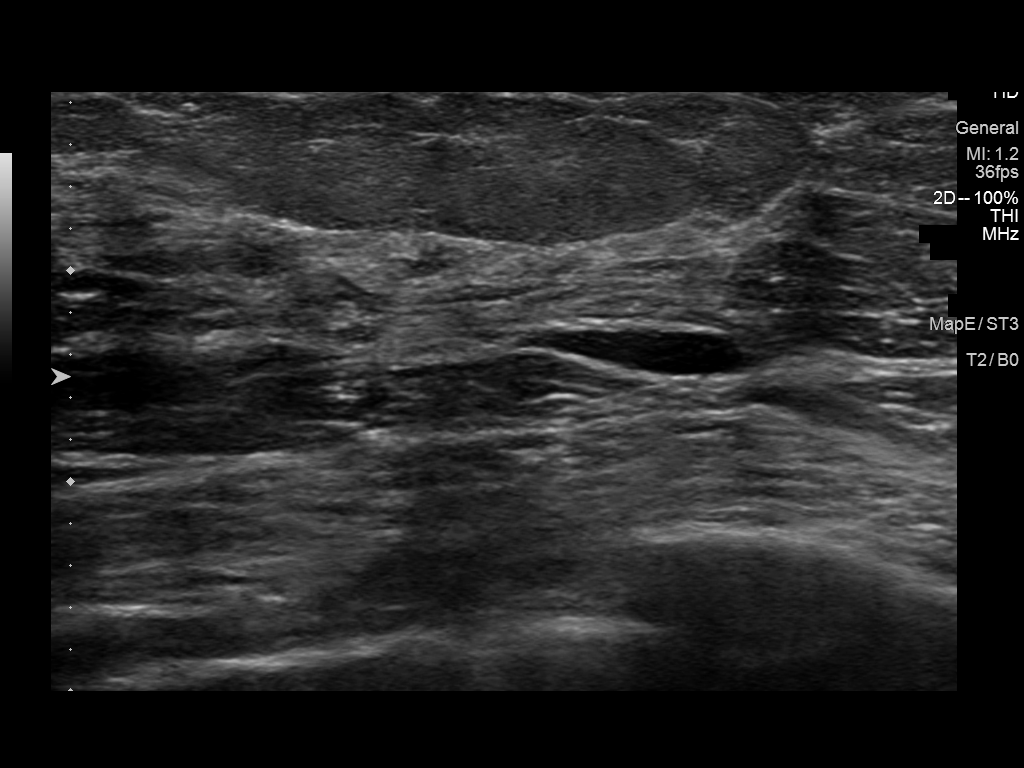
[im 2/6]
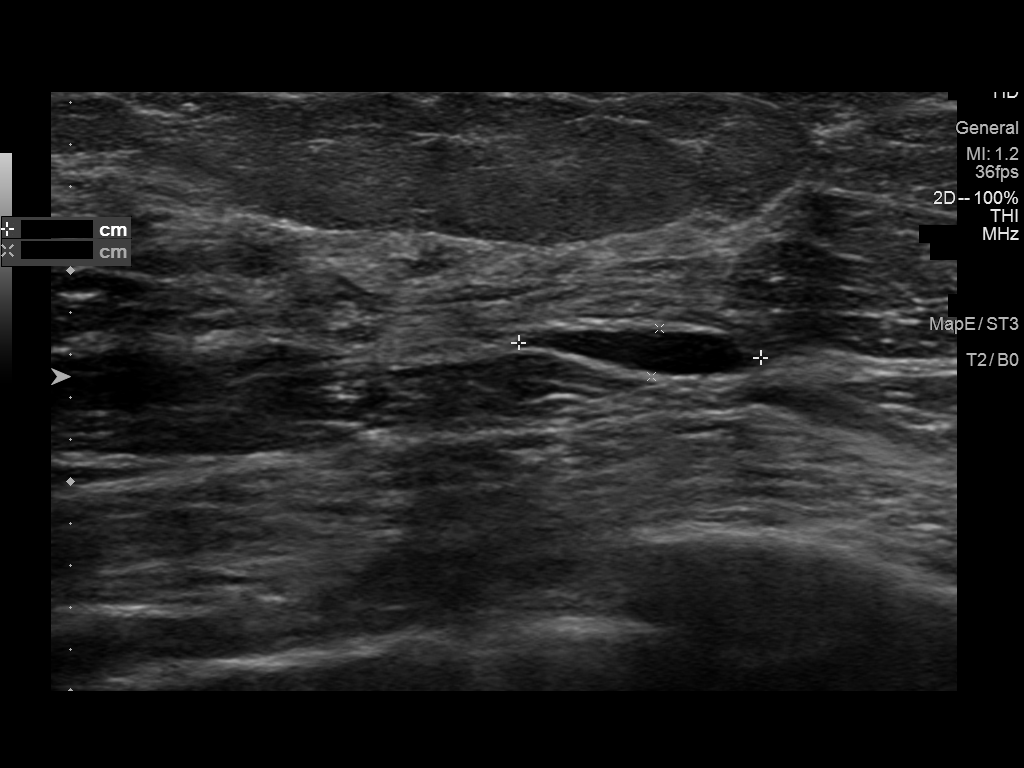
[im 3/6]
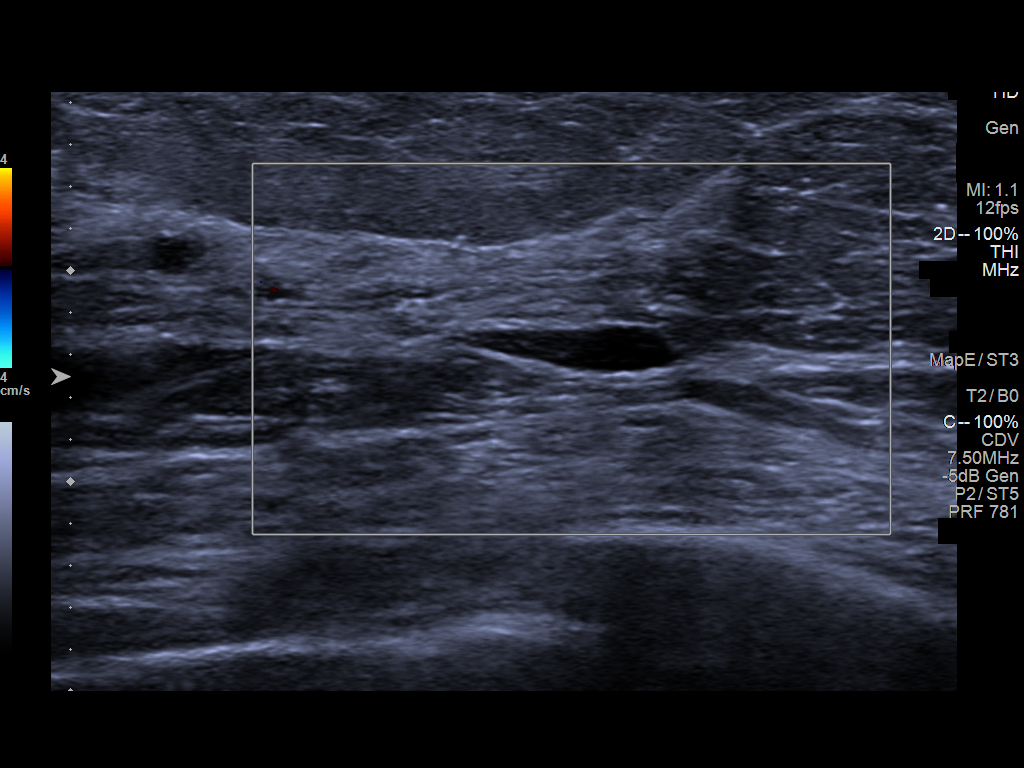
[im 4/6]
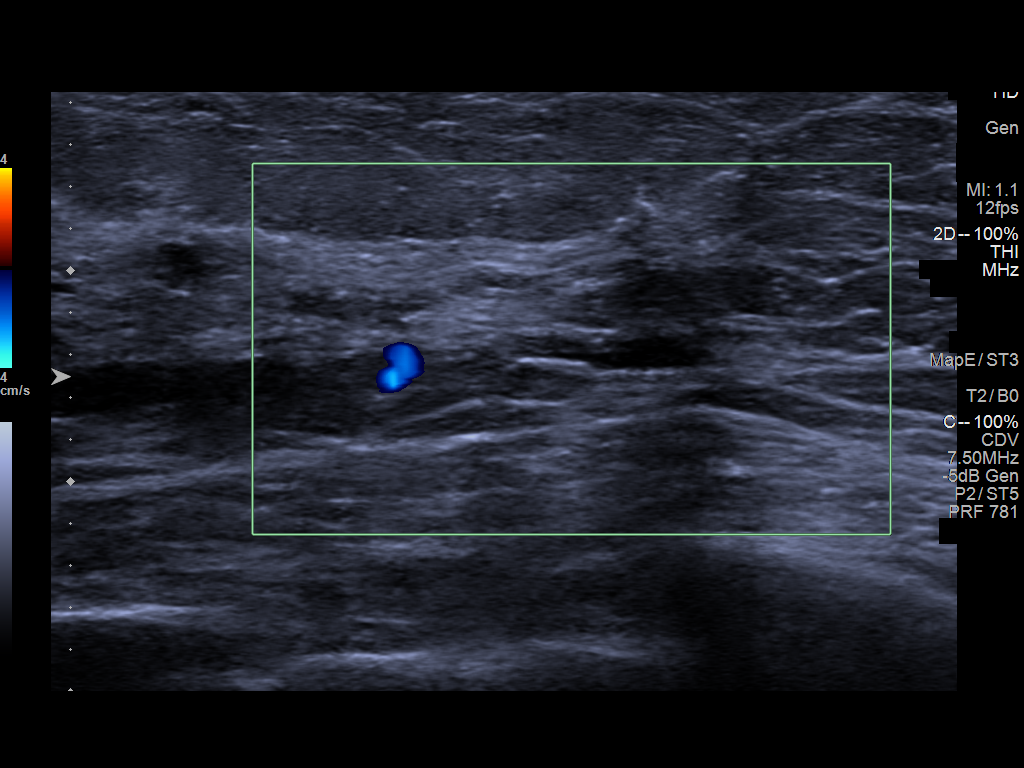
[im 5/6]
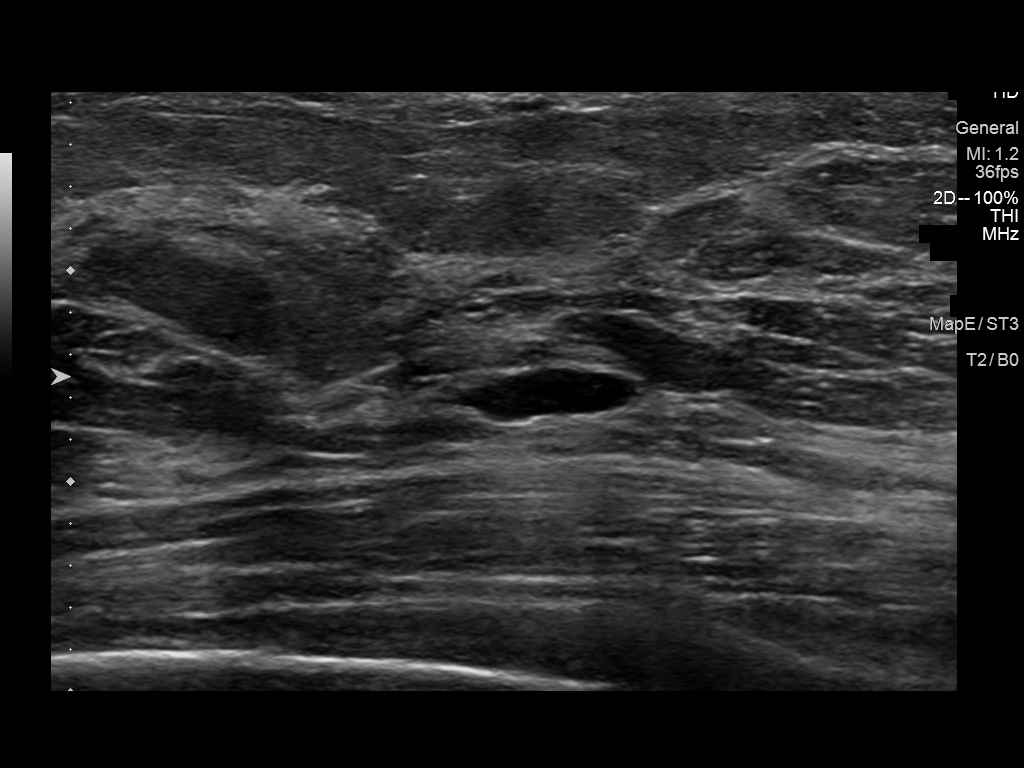
[im 6/6]
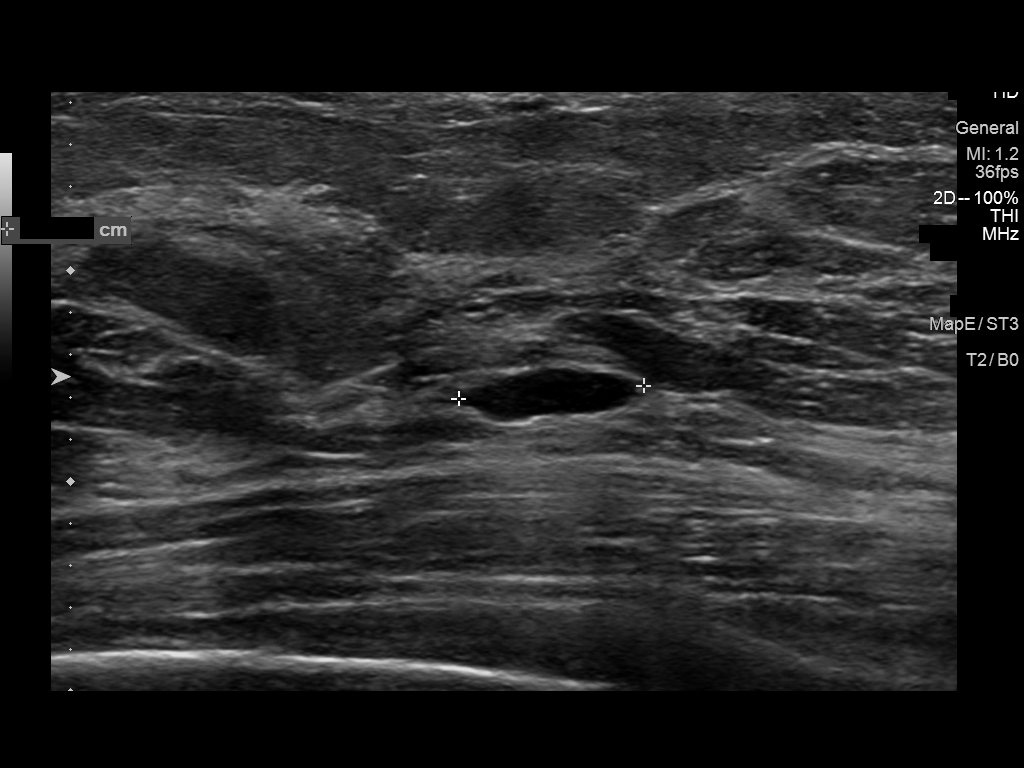

[6 of 6 positions shown; findings below may reference images not displayed]

ACR Breast Density Category b: There are scattered areas of
fibroglandular density.
FINDINGS: Spot-compression CC and MLO views of the area of concern in the full
field mediolateral view were obtained.

The focal asymmetry in the upper breast at posterior depth persists
but partially disperses on the spot compression views, indicating
compressibility. The asymmetry measures approximately 1.2 cm and
there is no associated architectural distortion. As the asymmetry is
directly behind the nipple on the CC view and in the upper breast on
the mediolateral view, its location is at or near 12 o'clock.

Targeted ultrasound is performed, demonstrating an oval parallel
circumscribed anechoic mass with scattered internal echoes at the
12:30 o'clock position 2 cm from the nipple at far posterior depth,
measuring approximately 1.2 x 0.2 x 0.9 cm, demonstrating posterior
acoustic enhancement and no internal power Doppler flow,
corresponding to the screening mammographic finding. No suspicious
solid mass or abnormal acoustic shadowing is identified.
IMPRESSION: 1. No mammographic or sonographic evidence of malignancy involving
the LEFT breast.
2. Benign 1.2 cm cyst in the upper breast at far posterior depth at
12:30 o'clock 2 cm from nipple which accounts for the screening
mammographic finding.

RECOMMENDATION:
Screening mammogram in one year.(Code:[id number])

I have discussed the findings and recommendations with the patient.
If applicable, a reminder letter will be sent to the patient
regarding the next appointment.

BI-RADS CATEGORY  2: Benign.

## 2022-01-16 ENCOUNTER — Encounter: Payer: BC Managed Care – PPO | Admitting: Primary Care

## 2022-01-20 ENCOUNTER — Encounter: Payer: BC Managed Care – PPO | Admitting: Primary Care

## 2022-01-20 NOTE — Progress Notes (Signed)
Patient was scheduled today for CPE, however she is not due until June 2023. ? ?Patient request to be rescheduled.  She was not seen today. ?

## 2022-01-28 ENCOUNTER — Ambulatory Visit: Payer: BC Managed Care – PPO | Admitting: Urology

## 2022-01-28 VITALS — BP 126/78 | HR 85 | Ht 65.0 in | Wt 140.0 lb

## 2022-01-28 DIAGNOSIS — Z85528 Personal history of other malignant neoplasm of kidney: Secondary | ICD-10-CM

## 2022-01-28 DIAGNOSIS — C642 Malignant neoplasm of left kidney, except renal pelvis: Secondary | ICD-10-CM

## 2022-01-28 DIAGNOSIS — N2889 Other specified disorders of kidney and ureter: Secondary | ICD-10-CM

## 2022-01-28 NOTE — Progress Notes (Signed)
01/28/22 2:52 PM   Chelsea Hicks 03-01-1966 [id number]  Referring provider:  Pleas Koch, NP [place],  [place] [address] Chief Complaint  Patient presents with   Renal cell carcinoma of left kidney      HPI: Chelsea Hicks is a 56 y.o.female with a personal history of renal cell carcinoma who returns today for 1 year follow-up with routine imaging.   She underwent left hand assisted laparoscopic nephrectomy on 11/30/19 for an endophytic hilar lesion. 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    Notably, intraoperatively on review of op report indicates that there is significant adhesions as well as a perinephric hematoma, presumably secondary to the biopsy. He post-op course was complicated by diaphragmatic hernia underwent surgical repair in July 2022.   Surveillance CT abdomen and pelvis on 01/06/2022 visualized No evidence of renal cell carcinoma recurrence post left nephrectomy. No nodularity identified in the nephrectomy bed.No evidence of metastatic disease in the abdomen and pelvis.    Chest xray on 01/06/2022 showed no active disease.   She is feeling well, no further GU or GI issues.     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, including shoulder 1031   Complication of anesthesia 1995   vomiting   Crohn's disease ([place]) 2005   Diffuse cystic mastopathy 2013   Herpes zoster without complication 01/14/4584   Hypertrophic lichen planus    Lichen simplex chronicus    PONV (postoperative nausea and vomiting)    Regional enteritis of unspecified site    Renal cell carcinoma, left ([place]) 11/30/2019   Special screening for malignant neoplasms, colon     Surgical History: Past Surgical History:  Procedure Laterality Date   bowel rescetion  07/2009   BREAST BIOPSY Left 02-11-04   core  bx,benign   CHEST TUBE INSERTION  03/11/2021   Procedure: CHEST TUBE INSERTION;  Surgeon: Jules Husbands, MD;  Location: [facility] ORS;  Service: General;;   CHOLECYSTECTOMY  12/2001   COLONOSCOPY  2011   Dr. Vira Agar   DILATION AND CURETTAGE OF UTERUS     hemoglobin electrophinesis     LAPAROSCOPIC NEPHRECTOMY, HAND ASSISTED Left 11/30/2019   Procedure: HAND ASSISTED LAPAROSCOPIC NEPHRECTOMY;  Surgeon: Hollice Espy, MD;  Location: [facility] ORS;  Service: Urology;  Laterality: Left;   LIPOMA EXCISION  2009   from back   LIPOMA EXCISION Left 07/18/2012   arm   TUBAL LIGATION     XI ROBOTIC ASSISTED REPAIR OF DIAPHRAGMATIC HERNIA  03/11/2021   Procedure: XI ROBOTIC ASSISTED REPAIR OF DIAPHRAGMATIC HERNIA;  Surgeon: Jules Husbands, MD;  Location: [facility] ORS;  Service: General;;    Home Medications:  Allergies as of 01/28/2022       Reactions   Amoxicillin-pot Clavulanate Hives   Penicillins Hives   Did it involve swelling of the face/tongue/throat, SOB, or low BP? No Did it involve sudden or severe rash/hives, skin peeling, or any reaction on the inside of your mouth or nose? No Did you need to seek medical attention at a hospital or doctor's office? No When did it last happen? More than 15 years ago If all above answers are "NO", may proceed with cephalosporin use.   Sulfonamide Derivatives Hives        Medication List        Accurate  as of Jan 28, 2022 11:59 PM. If you have any questions, ask your nurse or doctor.          azaTHIOprine 50 MG tablet Commonly known as: IMURAN Take 150 mg by mouth daily.   ferrous sulfate 325 (65 FE) MG tablet Take 325 mg by mouth daily with breakfast.   multivitamin with minerals Tabs tablet Take 1 tablet by mouth daily.   pantoprazole 40 MG tablet Commonly known as: PROTONIX Take by mouth. What changed: Another medication with the same name was removed. Continue taking this medication, and follow the directions you see here. Changed by:  Hollice Espy, MD   Vitamin D3 25 MCG tablet Commonly known as: Vitamin D Take 1,000 Units by mouth daily.        Allergies:  Allergies  Allergen Reactions   Amoxicillin-Pot Clavulanate Hives   Penicillins Hives    Did it involve swelling of the face/tongue/throat, SOB, or low BP? No Did it involve sudden or severe rash/hives, skin peeling, or any reaction on the inside of your mouth or nose? No Did you need to seek medical attention at a hospital or doctor's office? No When did it last happen? More than 15 years ago If all above answers are "NO", may proceed with cephalosporin use.    Sulfonamide Derivatives Hives    Family History: Family History  Problem Relation Age of Onset   Cancer Maternal Grandfather        prostate   Cancer Paternal Grandmother        breast   Breast cancer Paternal Grandmother    Breast cancer Cousin        mat cousin    Social History:  reports that she has never smoked. She has never used smokeless tobacco. She reports current alcohol use. She reports that she does not use drugs.   Physical Exam: BP 126/78   Pulse 85   Ht 5' 5"  (1.651 m)   Wt 140 lb (63.5 kg)   BMI 23.30 kg/m   Constitutional:  Alert and oriented, No acute distress. HEENT: [place] AT, moist mucus membranes.  Trachea midline, no masses. Cardiovascular: No clubbing, cyanosis, or edema. Respiratory: Normal respiratory effort, no increased work of breathing. Skin: No rashes, bruises or suspicious lesions. Neurologic: Grossly intact, no focal deficits, moving all 4 extremities. Psychiatric: Normal mood and affect.  Laboratory Data:  Lab Results  Component Value Date   CREATININE 1.08 (H) 03/11/2021   Lab Results  Component Value Date   HGBA1C 5.3 02/09/2008    Pertinent Imaging: CLINICAL DATA:  Urologic cancer surveillance.  Left renal cancer.   EXAM: CT ABDOMEN AND PELVIS WITH CONTRAST   TECHNIQUE: Multidetector CT imaging of the abdomen and pelvis was  performed using the standard protocol following bolus administration of intravenous contrast.   RADIATION DOSE REDUCTION: This exam was performed according to the departmental dose-optimization program which includes automated exposure control, adjustment of the mA and/or kV according to patient size and/or use of iterative reconstruction technique.   CONTRAST:  121m OMNIPAQUE IOHEXOL 300 MG/ML  SOLN   COMPARISON:  Jan 16, 2021, October 11, 2019   FINDINGS: Lower chest: No acute abnormality. Previously noted small pleural-parenchymal nodularity at the inferior lingula image 8 series 4 is unchanged.   Hepatobiliary: No focal liver abnormality is seen. Status post cholecystectomy. No biliary dilatation.   Pancreas: Unremarkable. No pancreatic ductal dilatation or surrounding inflammatory changes.   Spleen: Normal in size without focal abnormality.  Adrenals/Urinary Tract: Status post left nephrectomy. Adrenal glands are normal. No nodularity identified in the nephrectomy bed. Bladder is normal.   Stomach/Bowel: Small hiatal hernia is noted. Stomach is otherwise within normal limits. The appendix is not seen. No evidence of bowel wall thickening, distention, or inflammatory changes.   Vascular/Lymphatic: No significant vascular findings are present. No enlarged abdominal or pelvic lymph nodes.   Reproductive: Uterine fibroids are noted. Bilateral adnexa are normal.   Other: No free fluid   Musculoskeletal: Small focal sclerosis is identified in the T10 vertebral body unchanged compared prior exam 2021. Degenerative joint changes the L5-S1 level are identified unchanged.   IMPRESSION: 1. No evidence of renal cell carcinoma recurrence post left nephrectomy. No nodularity identified in the nephrectomy bed. 2. No evidence of metastatic disease in the abdomen and pelvis.     Electronically Signed   By: Abelardo Diesel M.D.   On: 01/07/2022 11:32   CLINICAL DATA:  Renal  cell carcinoma   EXAM: CHEST  1 VIEW   COMPARISON:  June 24, 2021   FINDINGS: The heart size and mediastinal contours are within normal limits. Both lungs are clear. The visualized skeletal structures are unremarkable.   IMPRESSION: No active disease.     Electronically Signed   By: Abelardo Diesel M.D.   On: 01/07/2022 11:20   I have personally reviewed the images and agree with radiologist interpretation.    Assessment & Plan:   1. Renal cell carcinoma of left kidney (HCC) - NED - Recommend continued annual surveillance for total of 3 years - Follow-up in a year with an chest x-ray and CT scan - Chest 1 View; Future - CT Abdomen Pelvis W Contrast; Future  F/u 1 year Ct abd/ pelvis + CXR 1 year  I,Shaketa Serafin,acting as a scribe for Hollice Espy, MD.,have documented all relevant documentation on the behalf of Hollice Espy, MD,as directed by  Hollice Espy, MD while in the presence of Hollice Espy, MD.  I have reviewed the above documentation for accuracy and completeness, and I agree with the above.   Hollice Espy, MD   [facility] [address] [place], [place] [address] [phone]

## 2022-03-04 ENCOUNTER — Ambulatory Visit (INDEPENDENT_AMBULATORY_CARE_PROVIDER_SITE_OTHER): Payer: BC Managed Care – PPO | Admitting: Primary Care

## 2022-03-04 ENCOUNTER — Encounter: Payer: Self-pay | Admitting: Primary Care

## 2022-03-04 ENCOUNTER — Other Ambulatory Visit (HOSPITAL_COMMUNITY)
Admission: RE | Admit: 2022-03-04 | Discharge: 2022-03-04 | Disposition: A | Discharge status: Home / Self Care | Payer: BC Managed Care – PPO | Source: Ambulatory Visit | Attending: Primary Care | Admitting: Primary Care

## 2022-03-04 VITALS — BP 106/60 | HR 86 | Temp 97.6°F | Ht 65.0 in | Wt 144.0 lb

## 2022-03-04 DIAGNOSIS — Z1159 Encounter for screening for other viral diseases: Secondary | ICD-10-CM | POA: Diagnosis not present

## 2022-03-04 DIAGNOSIS — K50919 Crohn's disease, unspecified, with unspecified complications: Secondary | ICD-10-CM | POA: Diagnosis not present

## 2022-03-04 DIAGNOSIS — Z124 Encounter for screening for malignant neoplasm of cervix: Secondary | ICD-10-CM | POA: Diagnosis present

## 2022-03-04 DIAGNOSIS — K440 Diaphragmatic hernia with obstruction, without gangrene: Secondary | ICD-10-CM

## 2022-03-04 DIAGNOSIS — Z0000 Encounter for general adult medical examination without abnormal findings: Secondary | ICD-10-CM | POA: Diagnosis not present

## 2022-03-04 DIAGNOSIS — C642 Malignant neoplasm of left kidney, except renal pelvis: Secondary | ICD-10-CM

## 2022-03-04 LAB — LIPID PANEL
Cholesterol: 225 mg/dL — ABNORMAL HIGH (ref 0–200)
HDL: 92.6 mg/dL (ref >39.00)
LDL Cholesterol: 110 mg/dL — ABNORMAL HIGH (ref 0–99)
NonHDL: 132.89
Total CHOL/HDL Ratio: 2
Triglycerides: 113 mg/dL (ref 0.0–149.0)
VLDL: 22.6 mg/dL (ref 0.0–40.0)

## 2022-03-04 NOTE — Assessment & Plan Note (Signed)
Following with GI. Continue Imuran 150 mg daily.  Colonoscopy and endoscopy updated in 2023.

## 2022-03-04 NOTE — Assessment & Plan Note (Signed)
Following with Urology. Reviewed MRI from May 2023.  Plan is to repeat MRI next year.

## 2022-03-04 NOTE — Assessment & Plan Note (Signed)
Shingrix due, first dose provided today. Tetanus due, will defer until next visit. Pap smear due, completed today. Colonoscopy UTD, follows closely with GI. Mammogram UTD.  Discussed the importance of a healthy diet and regular exercise in order for weight loss, and to reduce the risk of further co-morbidity.  Exam today stable Labs reviewed from Care Everywhere and also pending.

## 2022-03-04 NOTE — Patient Instructions (Signed)
Stop by the lab prior to leaving today. I will notify you of your results once received.   Schedule a nurse visit to complete your second Shingles vaccine in 2 to 6 months.   It was a pleasure to see you today!  Preventive Care 56-56 Years Old, Female Preventive care refers to lifestyle choices and visits with your health care provider that can promote health and wellness. Preventive care visits are also called wellness exams. What can I expect for my preventive care visit? Counseling Your health care provider may ask you questions about your: Medical history, including: Past medical problems. Family medical history. Pregnancy history. Current health, including: Menstrual cycle. Method of birth control. Emotional well-being. Home life and relationship well-being. Sexual activity and sexual health. Lifestyle, including: Alcohol, nicotine or tobacco, and drug use. Access to firearms. Diet, exercise, and sleep habits. Work and work Statistician. Sunscreen use. Safety issues such as seatbelt and bike helmet use. Physical exam Your health care provider will check your: Height and weight. These may be used to calculate your BMI (body mass index). BMI is a measurement that tells if you are at a healthy weight. Waist circumference. This measures the distance around your waistline. This measurement also tells if you are at a healthy weight and may help predict your risk of certain diseases, such as type 2 diabetes and high blood pressure. Heart rate and blood pressure. Body temperature. Skin for abnormal spots. What immunizations do I need?  Vaccines are usually given at various ages, according to a schedule. Your health care provider will recommend vaccines for you based on your age, medical history, and lifestyle or other factors, such as travel or where you work. What tests do I need? Screening Your health care provider may recommend screening tests for certain conditions. This may  include: Lipid and cholesterol levels. Diabetes screening. This is done by checking your blood sugar (glucose) after you have not eaten for a while (fasting). Pelvic exam and Pap test. Hepatitis B test. Hepatitis C test. HIV (human immunodeficiency virus) test. STI (sexually transmitted infection) testing, if you are at risk. Lung cancer screening. Colorectal cancer screening. Mammogram. Talk with your health care provider about when you should start having regular mammograms. This may depend on whether you have a family history of breast cancer. BRCA-related cancer screening. This may be done if you have a family history of breast, ovarian, tubal, or peritoneal cancers. Bone density scan. This is done to screen for osteoporosis. Talk with your health care provider about your test results, treatment options, and if necessary, the need for more tests. Follow these instructions at home: Eating and drinking  Eat a diet that includes fresh fruits and vegetables, whole grains, lean protein, and low-fat dairy products. Take vitamin and mineral supplements as recommended by your health care provider. Do not drink alcohol if: Your health care provider tells you not to drink. You are pregnant, may be pregnant, or are planning to become pregnant. If you drink alcohol: Limit how much you have to 0-1 drink a day. Know how much alcohol is in your drink. In the [place] one drink equals one 12 oz bottle of beer (355 mL), one 5 oz glass of wine (148 mL), or one 1 oz glass of hard liquor (44 mL). Lifestyle Brush your teeth every morning and night with fluoride toothpaste. Floss one time each day. Exercise for at least 30 minutes 5 or more days each week. Do not use any products that contain nicotine  or tobacco. These products include cigarettes, chewing tobacco, and vaping devices, such as e-cigarettes. If you need help quitting, ask your health care provider. Do not use drugs. If you are sexually  active, practice safe sex. Use a condom or other form of protection to prevent STIs. If you do not wish to become pregnant, use a form of birth control. If you plan to become pregnant, see your health care provider for a prepregnancy visit. Take aspirin only as told by your health care provider. Make sure that you understand how much to take and what form to take. Work with your health care provider to find out whether it is safe and beneficial for you to take aspirin daily. Find healthy ways to manage stress, such as: Meditation, yoga, or listening to music. Journaling. Talking to a trusted person. Spending time with friends and family. Minimize exposure to UV radiation to reduce your risk of skin cancer. Safety Always wear your seat belt while driving or riding in a vehicle. Do not drive: If you have been drinking alcohol. Do not ride with someone who has been drinking. When you are tired or distracted. While texting. If you have been using any mind-altering substances or drugs. Wear a helmet and other protective equipment during sports activities. If you have firearms in your house, make sure you follow all gun safety procedures. Seek help if you have been physically or sexually abused. What's next? Visit your health care provider once a year for an annual wellness visit. Ask your health care provider how often you should have your eyes and teeth checked. Stay up to date on all vaccines. This information is not intended to replace advice given to you by your health care provider. Make sure you discuss any questions you have with your health care provider. Document Revised: 02/19/2021 Document Reviewed: 02/19/2021 Elsevier Patient Education  [place].

## 2022-03-04 NOTE — Assessment & Plan Note (Signed)
Resolved since surgery in July 2022.

## 2022-03-04 NOTE — Progress Notes (Signed)
Subjective:    Patient ID: Chelsea Hicks, female    DOB: 1965/12/07, 56 y.o.   MRN: [id number]  HPI  Chelsea Hicks is a very pleasant 56 y.o. female who presents today for complete physical and follow up of chronic conditions.  Immunizations: -Tetanus: 2012 -Influenza: Completed last season  -Covid-19: 4 vaccines -Shingles: Has not completed  Diet: Fair diet.  Exercise: Walking often.  Eye exam: Completes annually  Dental exam: Completes semi-annually   Pap Smear: Completed in January 2020 Mammogram: Completed in May 2023  Colonoscopy: Completed in 2023, follows with GI.  BP Readings from Last 3 Encounters:  03/04/22 106/60  01/28/22 126/78  01/20/22 116/62       Review of Systems  Constitutional:  Negative for unexpected weight change.  HENT:  Negative for rhinorrhea.   Respiratory:  Negative for cough and shortness of breath.   Cardiovascular:  Negative for chest pain.  Gastrointestinal:  Negative for constipation and diarrhea.  Genitourinary:  Negative for difficulty urinating.  Musculoskeletal:  Negative for arthralgias and myalgias.  Skin:  Negative for rash.  Allergic/Immunologic: Negative for environmental allergies.  Neurological:  Negative for dizziness and headaches.  Psychiatric/Behavioral:  The patient is not nervous/anxious.          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, including shoulder 4401   Complication of anesthesia 1995   vomiting   Crohn's disease ([place]) 2005   Diffuse cystic mastopathy 2013   Herpes zoster without complication 0/10/7251   Hypertrophic lichen planus    Lichen simplex chronicus    PONV (postoperative nausea and vomiting)    Regional enteritis of unspecified site    Renal cell carcinoma, left ([place]) 11/30/2019   Special screening for malignant neoplasms, colon     Social History   Socioeconomic History   Marital status: Married    Spouse name: Not on file    Number of children: 2   Years of education: Not on file   Highest education level: Not on file  Occupational History   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[employer]  Tobacco Use   Smoking status: Never   Smokeless tobacco: Never  Vaping Use   Vaping Use: Never used  Substance and Sexual Activity   Alcohol use: Yes    Alcohol/week: 0.0 standard drinks of alcohol    Comment: occasionally   Drug use: No   Sexual activity: Not on file  Other Topics Concern   Not on file  Social History Narrative   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 Vira Agar   DILATION AND CURETTAGE OF UTERUS     hemoglobin electrophinesis     LAPAROSCOPIC NEPHRECTOMY, HAND ASSISTED Left 11/30/2019   Procedure: HAND ASSISTED LAPAROSCOPIC NEPHRECTOMY;  Surgeon: Hollice Espy, MD;  Location: [facility] ORS;  Service: Urology;  Laterality: Left;   LIPOMA EXCISION  2009   from back   LIPOMA EXCISION Left 07/18/2012   arm   TUBAL LIGATION     XI ROBOTIC ASSISTED REPAIR OF DIAPHRAGMATIC HERNIA  03/11/2021   Procedure: XI ROBOTIC ASSISTED REPAIR OF DIAPHRAGMATIC HERNIA;  Surgeon: Jules Husbands, MD;  Location: [facility] ORS;  Service: General;;    Family History  Problem Relation Age of Onset   Cancer Maternal Grandfather        prostate   Cancer Paternal Grandmother        breast   Breast cancer Paternal Grandmother    Breast cancer  Cousin        mat cousin    Allergies  Allergen Reactions   Amoxicillin-Pot Clavulanate Hives   Penicillins Hives    Did it involve swelling of the face/tongue/throat, SOB, or low BP? No Did it involve sudden or severe rash/hives, skin peeling, or any reaction on the inside of your mouth or nose? No Did you need to seek medical attention at a hospital or doctor's office? No When did it last happen? More than 15 years ago If all above answers are "NO", may proceed with cephalosporin use.    Sulfonamide Derivatives Hives    Current Outpatient Medications on File Prior to Visit  Medication Sig Dispense Refill   azaTHIOprine (IMURAN) 50 MG tablet Take 150 mg by mouth daily.     ferrous sulfate 325 (65 FE) MG tablet Take 325 mg by mouth daily with breakfast.     Multiple Vitamin (MULTIVITAMIN WITH MINERALS) TABS tablet Take 1 tablet by mouth daily.     Vitamin D3 (VITAMIN D) 25 MCG tablet Take 1,000 Units by mouth daily.     No current facility-administered medications on file prior to visit.    BP 106/60   Pulse 86   Temp 97.6 F (36.4 C) (Oral)   Ht 5' 5"  (1.651 m)   Wt 144 lb (65.3 kg)   SpO2 99%   BMI 23.96 kg/m  Objective:   Physical Exam Exam conducted with a chaperone present.  HENT:     Right Ear: Tympanic membrane and ear canal normal.     Left Ear: Tympanic membrane and ear canal normal.     Nose: Nose normal.  Eyes:     Conjunctiva/sclera: Conjunctivae normal.     Pupils: Pupils are equal, round, and reactive to light.  Neck:     Thyroid: No thyromegaly.  Cardiovascular:     Rate and Rhythm: Normal rate and regular rhythm.     Heart sounds: No murmur heard. Pulmonary:     Effort: Pulmonary effort is normal.     Breath sounds: Normal breath sounds. No rales.  Abdominal:     General: Bowel sounds are normal.     Palpations: Abdomen is soft.     Tenderness: There is no abdominal tenderness.  Genitourinary:    Labia:        Right: No tenderness or lesion.         Left: No tenderness or lesion.      Vagina: No vaginal discharge.     Cervix: No cervical motion tenderness or discharge.     Uterus: Deviated.      Adnexa: Right adnexa normal and left adnexa normal.  Musculoskeletal:        General: Normal range of motion.     Cervical back: Neck supple.  Lymphadenopathy:     Cervical: No  cervical adenopathy.  Skin:    General: Skin is warm and dry.     Findings: No rash.  Neurological:     Mental Status: She is alert and oriented to person, place, and time.     Cranial Nerves: No cranial nerve deficit.     Deep Tendon Reflexes: Reflexes are normal and symmetric.  Psychiatric:        Mood and Affect: Mood normal.           Assessment & Plan:   Problem List Items Addressed This Visit       Respiratory   Diaphragmatic hernia    Resolved since surgery in July 2022.        Genitourinary   Renal cell carcinoma, left [facility])    Following with Urology. Reviewed MRI from May 2023.  Plan is to repeat MRI next year.         Other   Preventative health care - Primary    Shingrix due, first dose provided today. Tetanus due, will defer until next visit. Pap smear due, completed today. Colonoscopy UTD, follows closely with GI. Mammogram UTD.  Discussed the importance of a healthy diet and regular exercise in order for weight loss, and to reduce the risk of further co-morbidity.  Exam today stable Labs reviewed from Care Everywhere and also pending.      Relevant Orders   Lipid panel   Crohn's disease ([place])    Following with GI. Continue Imuran 150 mg daily.  Colonoscopy and endoscopy updated in 2023.      Other Visit Diagnoses     Screening for cervical cancer       Relevant Orders   Cytology - PAP   Encounter for hepatitis C screening test for low risk patient       Relevant Orders   Hepatitis C Antibody          Pleas Koch, NP

## 2022-03-05 LAB — CYTOLOGY - PAP
Comment: NEGATIVE
Diagnosis: NEGATIVE
High risk HPV: NEGATIVE

## 2022-03-05 LAB — HEPATITIS C ANTIBODY: Hepatitis C Ab: NONREACTIVE

## 2022-03-06 DIAGNOSIS — Z23 Encounter for immunization: Secondary | ICD-10-CM

## 2022-03-06 NOTE — Addendum Note (Signed)
Addended by: Francella Solian on: 03/06/2022 09:20 AM   Modules accepted: Orders

## 2022-06-04 ENCOUNTER — Ambulatory Visit (INDEPENDENT_AMBULATORY_CARE_PROVIDER_SITE_OTHER): Payer: BC Managed Care – PPO

## 2022-06-04 DIAGNOSIS — Z23 Encounter for immunization: Secondary | ICD-10-CM

## 2023-01-05 ENCOUNTER — Other Ambulatory Visit: Payer: Self-pay | Admitting: Primary Care

## 2023-01-05 DIAGNOSIS — Z1231 Encounter for screening mammogram for malignant neoplasm of breast: Secondary | ICD-10-CM

## 2023-01-15 ENCOUNTER — Ambulatory Visit
Admission: RE | Admit: 2023-01-15 | Discharge: 2023-01-15 | Disposition: A | Discharge status: Home / Self Care | Payer: BC Managed Care – PPO | Source: Ambulatory Visit | Attending: Urology | Admitting: Urology

## 2023-01-15 ENCOUNTER — Other Ambulatory Visit: Payer: BC Managed Care – PPO

## 2023-01-15 DIAGNOSIS — C642 Malignant neoplasm of left kidney, except renal pelvis: Secondary | ICD-10-CM | POA: Insufficient documentation

## 2023-01-15 DIAGNOSIS — N2889 Other specified disorders of kidney and ureter: Secondary | ICD-10-CM | POA: Diagnosis present

## 2023-01-15 LAB — POCT I-STAT CREATININE: Creatinine, Ser: 1.3 mg/dL — ABNORMAL HIGH (ref 0.44–1.00)

## 2023-01-15 MED ORDER — IOHEXOL 300 MG/ML  SOLN
100.0000 mL | Freq: Once | INTRAMUSCULAR | Status: AC | PRN
  Administered 2023-01-15: 80 mL via INTRAVENOUS

## 2023-01-21 ENCOUNTER — Ambulatory Visit
Admission: RE | Admit: 2023-01-21 | Discharge: 2023-01-21 | Disposition: A | Discharge status: Home / Self Care | Payer: BC Managed Care – PPO | Source: Ambulatory Visit | Attending: Primary Care | Admitting: Primary Care

## 2023-01-21 DIAGNOSIS — Z1231 Encounter for screening mammogram for malignant neoplasm of breast: Secondary | ICD-10-CM | POA: Diagnosis not present

## 2023-01-27 ENCOUNTER — Ambulatory Visit: Payer: BC Managed Care – PPO | Admitting: Urology

## 2023-01-27 VITALS — BP 126/79 | HR 96 | Ht 65.0 in | Wt 144.0 lb

## 2023-01-27 DIAGNOSIS — C642 Malignant neoplasm of left kidney, except renal pelvis: Secondary | ICD-10-CM

## 2023-01-27 DIAGNOSIS — N281 Cyst of kidney, acquired: Secondary | ICD-10-CM | POA: Diagnosis not present

## 2023-01-27 DIAGNOSIS — Z85528 Personal history of other malignant neoplasm of kidney: Secondary | ICD-10-CM

## 2023-01-27 NOTE — Progress Notes (Signed)
Marcelle Overlie Plume,acting as a scribe for Vanna Scotland, MD.,have documented all relevant documentation on the behalf of Vanna Scotland, MD,as directed by  Vanna Scotland, MD while in the presence of Vanna Scotland, MD.  01/27/2023 3:23 PM   Chelsea Hicks 12/08/1965 [id number]  Referring provider:  Doreene Nest, NP [address] [place],  [place] [address] Chief Complaint  Patient presents with   Follow-up     HPI: 57 y.o.female with a personal history of renal cell carcinoma who returns today for 1 year follow-up with routine imaging.   She underwent left hand assisted laparoscopic nephrectomy on 11/30/19 for an endophytic hilar lesion. 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    Notably, intraoperatively on review of op report indicates that there is significant adhesions as well as a perinephric hematoma, presumably secondary to the biopsy. The post-op course was complicated by diaphragmatic hernia underwent surgical repair in July 2022.   Surveillance CT abdomen and pelvis on 01/20/2023 visualized. No evidence of renal cell carcinoma recurrence post left nephrectomy. No nodularity identified in the nephrectomy bed. No evidence of metastatic disease in the abdomen and pelvis. A small, less than one centimeter spot on her right kidney was noted, likely an early cyst, which will be monitored with an ultrasound in two years.    Chest xray on 01/21/2023 showed no active disease.   She is feeling well, no further GU or GI issues.     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, including shoulder 2013   Complication of anesthesia 1995   vomiting   Crohn's disease (HCC) 2005   Diffuse cystic mastopathy 2013   Herpes zoster without complication 03/13/2019   Hypertrophic lichen planus    Lichen simplex chronicus    PONV  (postoperative nausea and vomiting)    Regional enteritis of unspecified site    Renal cell carcinoma, left (HCC) 11/30/2019   Special screening for malignant neoplasms, colon     Surgical History: Past Surgical History:  Procedure Laterality Date   bowel rescetion  07/2009   BREAST BIOPSY Left 02-11-04   core bx,benign   CHEST TUBE INSERTION  03/11/2021   Procedure: CHEST TUBE INSERTION;  Surgeon: Leafy Ro, MD;  Location: [facility] OR[facility];  Service: General;;   CHOLECYSTECTOMY  12/2001   COLONOSCOPY  2011   Dr. Mechele Collin   DILATION AND CURETTAGE OF UTERUS     hemoglobin electrophinesis     LAPAROSCOPIC NEPHRECTOMY, HAND ASSISTED Left 11/30/2019   Procedure: HAND ASSISTED LAPAROSCOPIC NEPHRECTOMY;  Surgeon: Vanna Scotland, MD;  Location: [facility];  Service: Urology;  Laterality: Left;   LIPOMA EXCISION  2009   from back   LIPOMA EXCISION Left 07/18/2012   arm   TUBAL LIGATION     XI ROBOTIC ASSISTED REPAIR OF DIAPHRAGMATIC HERNIA  03/11/2021   Procedure: XI ROBOTIC ASSISTED REPAIR OF DIAPHRAGMATIC HERNIA;  Surgeon: Leafy Ro, MD;  Location: [facility] OR[facility];  Service: General;;    Home Medications:  Allergies as of 01/27/2023       Reactions   Amoxicillin-pot Clavulanate Hives   Penicillins Hives   Did it involve swelling of the face/tongue/throat, SOB, or low BP? No Did it involve sudden or severe rash/hives, skin peeling, or any reaction on the inside of your mouth or nose? No Did you need  to seek medical attention at a hospital or doctor's office? No When did it last happen? More than 15 years ago If all above answers are "NO", may proceed with cephalosporin use.   Sulfonamide Derivatives Hives        Medication List        Accurate as of Jan 27, 2023  3:23 PM. If you have any questions, ask your nurse or doctor.          azaTHIOprine 50 MG tablet Commonly known as: IMURAN Take 150 mg by mouth daily.   ferrous sulfate 325 (65 FE) MG tablet Take 325 mg by mouth daily  with breakfast.   multivitamin with minerals Tabs tablet Take 1 tablet by mouth daily.   vitamin D3 25 MCG tablet Commonly known as: CHOLECALCIFEROL Take 1,000 Units by mouth daily.        Allergies:  Allergies  Allergen Reactions   Amoxicillin-Pot Clavulanate Hives   Penicillins Hives    Did it involve swelling of the face/tongue/throat, SOB, or low BP? No Did it involve sudden or severe rash/hives, skin peeling, or any reaction on the inside of your mouth or nose? No Did you need to seek medical attention at a hospital or doctor's office? No When did it last happen? More than 15 years ago If all above answers are "NO", may proceed with cephalosporin use.    Sulfonamide Derivatives Hives    Family History: Family History  Problem Relation Age of Onset   Cancer Maternal Grandfather        prostate   Cancer Paternal Grandmother        breast   Breast cancer Paternal Grandmother    Breast cancer Cousin        mat cousin    Social History:  reports that she has never smoked. She has never used smokeless tobacco. She reports current alcohol use. She reports that she does not use drugs.   Physical Exam: BP 126/79   Pulse 96   Ht 5\' 5"  (1.651 m)   Wt 144 lb (65.3 kg)   BMI 23.96 kg/m   Constitutional:  Alert and oriented, No acute distress. HEENT: [place] AT, moist mucus membranes.  Trachea midline, no masses. Neurologic: Grossly intact, no focal deficits, moving all 4 extremities. Psychiatric: Normal mood and affect.   Pertinent Imaging: EXAM: CT ABDOMEN AND PELVIS WITH CONTRAST   TECHNIQUE: Multidetector CT imaging of the abdomen and pelvis was performed using the standard protocol following bolus administration of intravenous contrast.   RADIATION DOSE REDUCTION: This exam was performed according to the departmental dose-optimization program which includes automated exposure control, adjustment of the mA and/or kV according to patient size and/or use of  iterative reconstruction technique.   CONTRAST:  80mL OMNIPAQUE IOHEXOL 300 MG/ML  SOLN   COMPARISON:  01/06/2022 and 10/11/2019.   FINDINGS: Lower chest: Lung bases are clear. Heart size normal. No pericardial or pleural effusion. Distal esophagus is grossly unremarkable.   Hepatobiliary: Liver is unremarkable. Cholecystectomy. No biliary ductal dilatation.   Pancreas: Negative.   Spleen: Negative.   Adrenals/Urinary Tract: Right adrenal gland is unremarkable. 8 mm low-attenuation lesion in the right kidney, too small to characterize but present dating back to 10/11/2019. Right kidney is otherwise unremarkable. Right ureter is decompressed. Left adrenalectomy and left nephrectomy. Bladder wall may be slightly thickened but is also under distended.   Stomach/Bowel: Stomach and small bowel are unremarkable. Right hemicolectomy. Colon is otherwise unremarkable.   Vascular/Lymphatic: Atherosclerotic calcification  of the aorta. No pathologically enlarged lymph nodes.   Reproductive: Uterus is visualized.  No adnexal mass.   Other: No free fluid.  Mesenteries and peritoneum are unremarkable.   Musculoskeletal: Mild degenerative changes in the spine. No worrisome lytic or sclerotic lesions. Bone island in the left iliac wing.   IMPRESSION: 1. 8 mm low-attenuation lesion in the right kidney, too small to characterize but present dating back to 10/11/2019. Recommend continued attention on follow-up. 2. No evidence of metastatic disease.     Electronically Signed   By: Leanna Battles M.D.   On: 01/20/2023 10:16   I have personally reviewed the images and agree with radiologist interpretation.   EXAM: CHEST  1 VIEW   COMPARISON:  Chest x-ray 01/06/2022   FINDINGS: The heart and mediastinal contours are within normal limits.   No focal consolidation. No pulmonary edema. No pleural effusion. No pneumothorax.   No acute osseous abnormality.   IMPRESSION: No active  disease.     Electronically Signed   By: Tish Frederickson M.D.   On: 01/21/2023 03:15  I have personally reviewed the images and agree with radiologist interpretation.    Assessment & Plan:   1. Renal cell carcinoma of left kidney (HCC) - Final imaging is negative for any evidence of metastatic disease - No further surveillance is needed at this time  2. Renal mass - Schedule renal ultrasound in two years to monitor the small cyst on the right kidney.  [facility] [address] [place], [place] [address] [phone]

## 2023-03-23 ENCOUNTER — Encounter: Payer: BC Managed Care – PPO | Admitting: Primary Care

## 2023-04-01 ENCOUNTER — Encounter: Payer: Self-pay | Admitting: Primary Care

## 2023-04-01 ENCOUNTER — Ambulatory Visit (INDEPENDENT_AMBULATORY_CARE_PROVIDER_SITE_OTHER): Payer: BC Managed Care – PPO | Admitting: Primary Care

## 2023-04-01 VITALS — BP 120/66 | HR 65 | Temp 97.8°F | Ht 65.0 in | Wt 144.0 lb

## 2023-04-01 DIAGNOSIS — Z85528 Personal history of other malignant neoplasm of kidney: Secondary | ICD-10-CM

## 2023-04-01 DIAGNOSIS — E785 Hyperlipidemia, unspecified: Secondary | ICD-10-CM

## 2023-04-01 DIAGNOSIS — K50919 Crohn's disease, unspecified, with unspecified complications: Secondary | ICD-10-CM | POA: Diagnosis not present

## 2023-04-01 DIAGNOSIS — Z0000 Encounter for general adult medical examination without abnormal findings: Secondary | ICD-10-CM | POA: Diagnosis not present

## 2023-04-01 DIAGNOSIS — Z23 Encounter for immunization: Secondary | ICD-10-CM | POA: Diagnosis not present

## 2023-04-01 DIAGNOSIS — R4586 Emotional lability: Secondary | ICD-10-CM | POA: Diagnosis not present

## 2023-04-01 LAB — LIPID PANEL
Cholesterol: 235 mg/dL — ABNORMAL HIGH (ref 0–200)
HDL: 79.2 mg/dL (ref >39.00)
LDL Cholesterol: 128 mg/dL — ABNORMAL HIGH (ref 0–99)
NonHDL: 155.68
Total CHOL/HDL Ratio: 3
Triglycerides: 137 mg/dL (ref 0.0–149.0)
VLDL: 27.4 mg/dL (ref 0.0–40.0)

## 2023-04-01 LAB — BASIC METABOLIC PANEL
BUN: 14 mg/dL (ref 6–23)
CO2: 30 mEq/L (ref 19–32)
Calcium: 9.6 mg/dL (ref 8.4–10.5)
Chloride: 102 mEq/L (ref 96–112)
Creatinine, Ser: 1.03 mg/dL (ref 0.40–1.20)
GFR: 60.64 mL/min (ref >60.00)
Glucose, Bld: 83 mg/dL (ref 70–99)
Potassium: 4.5 mEq/L (ref 3.5–5.1)
Sodium: 140 mEq/L (ref 135–145)

## 2023-04-01 NOTE — Patient Instructions (Signed)
Stop by the lab prior to leaving today. I will notify you of your results once received.   It was a pleasure to see you today!  

## 2023-04-01 NOTE — Assessment & Plan Note (Signed)
In remission. Reviewed office notes from urology from May 2024.  Repeat renal function pending.

## 2023-04-01 NOTE — Progress Notes (Signed)
Subjective:    Patient ID: Chelsea Hicks, female    DOB: Apr 28, 1966, 57 y.o.   MRN: [id number]  HPI  Chelsea Hicks is a very pleasant 57 y.o. female who presents today for complete physical and follow up of chronic conditions.  She is experiencing menopausal symptoms of irritability and mood swings. Symptoms began about 2 years ago. Her last menstrual period was July 2022. Symptoms have been worse since she's been on break from school.  Typically, during the school year she is distracted and symptoms are not as bothersome.  Immunizations: -Tetanus: Completed in 2012, due today -Shingles: Completed Shingrix series -Pneumonia: Completed Pneumovax 23 in 2016  Diet: Fair diet.  Exercise: No regular exercise.  Eye exam: Completes annually  Dental exam: Completes semi-annually    Pap Smear: Completed 2023 Mammogram: Completed in May 2024  Colonoscopy: Completed in 2023, due 2026      Review of Systems  Constitutional:  Negative for unexpected weight change.  HENT:  Negative for rhinorrhea.   Respiratory:  Negative for cough and shortness of breath.   Cardiovascular:  Negative for chest pain.  Gastrointestinal:  Negative for constipation and diarrhea.  Genitourinary:  Negative for difficulty urinating.       Mood swings and irritability   Musculoskeletal:  Negative for arthralgias and myalgias.  Skin:  Negative for rash.  Allergic/Immunologic: Negative for environmental allergies.  Neurological:  Negative for dizziness and headaches.  Psychiatric/Behavioral:  The patient is not nervous/anxious.          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, including shoulder 2013   Complication of anesthesia 1995   vomiting   Crohn's disease (HCC) 2005   Diffuse cystic mastopathy 2013   Herpes zoster without complication 03/13/2019   Hypertrophic lichen planus    Lichen simplex chronicus    PONV (postoperative nausea and  vomiting)    Regional enteritis of unspecified site    Renal cell carcinoma, left (HCC) 11/30/2019   Special screening for malignant neoplasms, colon     Social History   Socioeconomic History   Marital status: Married    Spouse name: Not on file   Number of children: 2   Years of education: Not on file   Highest education level: Not on file  Occupational History   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[employer]  Tobacco Use   Smoking status: Never   Smokeless tobacco: Never  Vaping Use   Vaping status: Never Used  Substance and Sexual Activity   Alcohol use: Yes    Alcohol/week: 0.0 standard drinks of alcohol    Comment: occasionally   Drug use: No   Sexual activity: Not on file  Other Topics Concern   Not on file  Social History Narrative   Attending [employer] getting Undergraduate in Early Childhood   Social Determinants of Health   Financial Resource Strain: Not on file  Food Insecurity: Not on file  Transportation Needs: Not on file  Physical Activity: Not on file  Stress: Not on file  Social Connections: Not on file  Intimate Partner Violence: Not on file    Past Surgical History:  Procedure Laterality Date   bowel rescetion  07/2009   BREAST BIOPSY Left 02-11-04   core bx,benign   CHEST TUBE INSERTION  03/11/2021   Procedure: CHEST TUBE INSERTION;  Surgeon: Leafy Ro, MD;  Location: [facility];  Service: General;;   CHOLECYSTECTOMY  12/2001   COLONOSCOPY  2011   Dr. Mechele Collin   DILATION AND CURETTAGE OF UTERUS     hemoglobin electrophinesis     LAPAROSCOPIC NEPHRECTOMY, HAND ASSISTED Left 11/30/2019   Procedure: HAND ASSISTED LAPAROSCOPIC NEPHRECTOMY;  Surgeon: Vanna Scotland, MD;  Location: [facility];  Service: Urology;  Laterality: Left;   LIPOMA EXCISION  2009   from back   LIPOMA EXCISION Left 07/18/2012   arm   TUBAL LIGATION     XI ROBOTIC ASSISTED REPAIR OF DIAPHRAGMATIC HERNIA  03/11/2021   Procedure: XI ROBOTIC ASSISTED REPAIR OF DIAPHRAGMATIC HERNIA;  Surgeon:  Leafy Ro, MD;  Location: [facility];  Service: General;;    Family History  Problem Relation Age of Onset   Cancer Maternal Grandfather        prostate   Cancer Paternal Grandmother        breast   Breast cancer Paternal Grandmother    Breast cancer Cousin        mat cousin    Allergies  Allergen Reactions   Amoxicillin-Pot Clavulanate Hives   Penicillins Hives    Did it involve swelling of the face/tongue/throat, SOB, or low BP? No Did it involve sudden or severe rash/hives, skin peeling, or any reaction on the inside of your mouth or nose? No Did you need to seek medical attention at a hospital or doctor's office? No When did it last happen? More than 15 years ago If all above answers are "NO", may proceed with cephalosporin use.    Sulfonamide Derivatives Hives    Current Outpatient Medications on File Prior to Visit  Medication Sig Dispense Refill   azaTHIOprine (IMURAN) 50 MG tablet Take 150 mg by mouth daily.     ferrous sulfate 325 (65 FE) MG tablet Take 325 mg by mouth daily with breakfast.     Multiple Vitamin (MULTIVITAMIN WITH MINERALS) TABS tablet Take 1 tablet by mouth daily.     Vitamin D3 (VITAMIN D) 25 MCG tablet Take 1,000 Units by mouth daily.     No current facility-administered medications on file prior to visit.    BP 120/66   Pulse 65   Temp 97.8 F (36.6 C) (Temporal)   Ht 5\' 5"  (1.651 m)   Wt 144 lb (65.3 kg)   SpO2 100%   BMI 23.96 kg/m  Objective:   Physical Exam HENT:     Right Ear: Tympanic membrane and ear canal normal.     Left Ear: Tympanic membrane and ear canal normal.     Nose: Nose normal.  Eyes:     Conjunctiva/sclera: Conjunctivae normal.     Pupils: Pupils are equal, round, and reactive to light.  Neck:     Thyroid: No thyromegaly.  Cardiovascular:     Rate and Rhythm: Normal rate and regular rhythm.     Heart sounds: No murmur heard. Pulmonary:     Effort: Pulmonary effort is normal.     Breath sounds: Normal  breath sounds. No rales.  Abdominal:     General: Bowel sounds are normal.     Palpations: Abdomen is soft.     Tenderness: There is no abdominal tenderness.  Musculoskeletal:        General: Normal range of motion.     Cervical back: Neck supple.  Lymphadenopathy:     Cervical: No cervical adenopathy.  Skin:    General: Skin is warm and dry.     Findings: No rash.  Neurological:     Mental Status: She is alert  and oriented to person, place, and time.     Cranial Nerves: No cranial nerve deficit.     Deep Tendon Reflexes: Reflexes are normal and symmetric.  Psychiatric:        Mood and Affect: Mood normal.           Assessment & Plan:  Preventative health care Assessment & Plan: Tetanus due, provided today. Pap smear UTD. Mammogram up-to-date Colonoscopy UTD, due 2026  Discussed the importance of a healthy diet and regular exercise in order for weight loss, and to reduce the risk of further co-morbidity.  Exam stable. Labs pending.  Follow up in 1 year for repeat physical.    Crohn's disease with complication, unspecified gastrointestinal tract location [facility]) Assessment & Plan: Following with GI. Continue Imuran 150 mg daily.  Repeat colonoscopy in 2026.   History of renal cell carcinoma Assessment & Plan: In remission. Reviewed office notes from urology from May 2024.  Repeat renal function pending.  Orders: -     Basic metabolic panel  Mood swings Assessment & Plan: Menopausal.  Offered treatment with buspirone 5 mg once to twice daily, she declines at this time but will notify if she changes her mind.   Hyperlipidemia, unspecified hyperlipidemia type -     Lipid panel        Doreene Nest, NP

## 2023-04-01 NOTE — Assessment & Plan Note (Signed)
Tetanus due, provided today. Pap smear UTD. Mammogram up-to-date Colonoscopy UTD, due 2026  Discussed the importance of a healthy diet and regular exercise in order for weight loss, and to reduce the risk of further co-morbidity.  Exam stable. Labs pending.  Follow up in 1 year for repeat physical.

## 2023-04-01 NOTE — Assessment & Plan Note (Signed)
Menopausal.  Offered treatment with buspirone 5 mg once to twice daily, she declines at this time but will notify if she changes her mind.

## 2023-04-01 NOTE — Assessment & Plan Note (Signed)
Following with GI. Continue Imuran 150 mg daily.  Repeat colonoscopy in 2026.

## 2023-08-29 ENCOUNTER — Encounter: Payer: Self-pay | Admitting: Urology

## 2023-12-22 ENCOUNTER — Ambulatory Visit: Admitting: Primary Care

## 2023-12-22 VITALS — BP 132/68 | HR 76 | Temp 98.0°F | Ht 65.0 in | Wt 154.0 lb

## 2023-12-22 DIAGNOSIS — J358 Other chronic diseases of tonsils and adenoids: Secondary | ICD-10-CM

## 2023-12-22 NOTE — Progress Notes (Signed)
   Acute Office Visit  Subjective:     Patient ID: Chelsea Hicks, female    DOB: 1966-05-07, 58 y.o.   MRN: [id number]  Chief Complaint  Patient presents with   Mass    Patient states she has a knot in the back of her throat, noticed yesterday     HPI Donnice is a 58 year old female with history of Crohn's disease, renal cell carcinoma, diaphragmatic hernia who presents today to discuss knot of right side of throat  Onset - yesterday while eating lunch, she noticed something in her throat when swallowing, described as "like a chip was stuck"; there was nothing to dislodge. This has not impacted swallowing. The knot is not painful or irritated.   Denies heartburn, sinus congestion, trouble breathing. Has chronic post-nasal drip, which has not worsened. No prior history of tonsillolith. Denies fevers, chills. Denies seasonal allergies. Husband reports snoring. She has not consumed any foods that may irritate oropharyngeal mucosa such as spicy, sour, citrus foods that is unusual for her normal diet.   Review of Systems  Constitutional:  Negative for chills and fever.  HENT:  Negative for congestion, ear pain and sore throat.   Respiratory:  Negative for shortness of breath.   Cardiovascular:  Negative for chest pain.  Gastrointestinal:  Negative for constipation, diarrhea, heartburn, nausea and vomiting.  Endo/Heme/Allergies:  Negative for environmental allergies.        Objective:    BP 132/68   Pulse 76   Temp 98 F (36.7 C) (Temporal)   Ht 5\' 5"  (1.651 m)   Wt 69.9 kg   SpO2 98%   BMI 25.63 kg/m    Physical Exam Constitutional:      General: She is not in acute distress.    Appearance: Normal appearance.  HENT:     Right Ear: Tympanic membrane, ear canal and external ear normal.     Left Ear: Tympanic membrane, ear canal and external ear normal.     Mouth/Throat:     Mouth: Mucous membranes are moist.     Pharynx: Oropharynx is clear. No oropharyngeal exudate  or posterior oropharyngeal erythema.     Tonsils: No tonsillar exudate or tonsillar abscesses.     Comments: Approximately 0.5-1cm white/yellow nodule of right oropharynx; immobile Cardiovascular:     Rate and Rhythm: Normal rate and regular rhythm.  Pulmonary:     Effort: Pulmonary effort is normal.     Breath sounds: Normal breath sounds.  Skin:    General: Skin is warm and dry.  Neurological:     Mental Status: She is alert and oriented to person, place, and time.    No results found for any visits on 12/22/23.      Assessment & Plan:   Problem List Items Addressed This Visit       Respiratory   Tonsillar mass - Primary   Etiology unclear PE suggestive of tonsillolith  With patient's consent, unsuccessful attempt to mobilize mass with Qtip applicator  Referral to ENT placed for further evaluation/treatment        No orders of the defined types were placed in this encounter.  Follow up as needed based on symptoms.  Tylar Merendino M Mateus Rewerts, RN

## 2023-12-22 NOTE — Patient Instructions (Addendum)
 Home remedies to try: Gargling with [place]isterine Water-jet or water-pik    Referral placed to ENT  You will get a call or message in MyChart about scheduling.

## 2023-12-22 NOTE — Progress Notes (Signed)
 Subjective:    Patient ID: Chelsea Hicks, female    DOB: [birth year], 58 y.o.   MRN: [id number]  HPI  Chelsea Hicks is a very pleasant 58 y.o. female with a history of regional enteritis, crohn's disease, renal cell carcinoma who presents today to discuss oral mass.  Yesterday she noticed a yellow mass to the back of her right throat.   She denies dysphagia, shortness of breath, post nasal drip, rhinorrhea, chest pain, esophageal burning, pain, fevers, drainage, chills, adenopathy.   She doesn't recall eating anything that would get lodged. She's not tried to remove or touch the mass.   BP Readings from Last 3 Encounters:  12/22/23 132/68  04/01/23 120/66  01/27/23 126/79      Review of Systems  Constitutional:  Negative for fever.  HENT:  Negative for ear pain, postnasal drip, sore throat and trouble swallowing.        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, including shoulder 2013   Complication of anesthesia 1995   vomiting   Crohn's disease (HCC) 2005   Diffuse cystic mastopathy 2013   Herpes zoster without complication 03/13/2019   Hypertrophic lichen planus    Lichen simplex chronicus    PONV (postoperative nausea and vomiting)    Regional enteritis of unspecified site    Renal cell carcinoma, left (HCC) 11/30/2019   Special screening for malignant neoplasms, colon     Social History   Socioeconomic History   Marital status: Married    Spouse name: Not on file   Number of children: 2   Years of education: Not on file   Highest education level: Bachelor's degree (e.g., BA, AB, BS)  Occupational History   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[employer]  Tobacco Use   Smoking status: Never   Smokeless tobacco: Never  Vaping Use   Vaping status: Never Used  Substance and Sexual Activity   Alcohol use: Yes    Alcohol/week: 0.0 standard drinks of alcohol    Comment: occasionally    Drug use: No   Sexual activity: Not on file  Other Topics Concern   Not on file  Social History Narrative   Attending [employer] getting Undergraduate in Early Childhood   Social Drivers of Health   Financial Resource Strain: Low Risk  (12/22/2023)   Overall Financial Resource Strain (CARDIA)    Difficulty of Paying Living Expenses: Not hard at all  Food Insecurity: No Food Insecurity (12/22/2023)   Hunger Vital Sign    Worried About Running Out of Food in the Last Year: Never true    Ran Out of Food in the Last Year: Never true  Transportation Needs: No Transportation Needs (12/22/2023)   PRAPARE - Administrator, Civil Service (Medical): No    Lack of Transportation (Non-Medical): No  Physical Activity: Sufficiently Active (12/22/2023)   Exercise Vital Sign    Days of Exercise per Week: 3 days    Minutes of Exercise per Session: 60 min  Stress: No Stress Concern Present (12/22/2023)   [employer] of Occupational Health - Occupational Stress Questionnaire    Feeling of Stress : Not at all  Social Connections: Moderately Integrated (12/22/2023)   Social Connection and Isolation Panel [NHANES]    Frequency of Communication with Friends and Family: Once a week    Frequency of Social Gatherings with Friends and Family: Once  a week    Attends Religious Services: More than 4 times per year    Active Member of Clubs or Organizations: Yes    Attends Banker Meetings: More than 4 times per year    Marital Status: Married  Catering manager Violence: Not on file    Past Surgical History:  Procedure Laterality Date   bowel rescetion  07/2009   BREAST BIOPSY Left 02-11-04   core bx,benign   CHEST TUBE INSERTION  03/11/2021   Procedure: CHEST TUBE INSERTION;  Surgeon: Leafy Ro, MD;  Location: [facility];  Service: General;;   CHOLECYSTECTOMY  12/2001   COLONOSCOPY  2011   Dr. Mechele Collin   DILATION AND CURETTAGE OF UTERUS     hemoglobin electrophinesis      LAPAROSCOPIC NEPHRECTOMY, HAND ASSISTED Left 11/30/2019   Procedure: HAND ASSISTED LAPAROSCOPIC NEPHRECTOMY;  Surgeon: Vanna Scotland, MD;  Location: [facility];  Service: Urology;  Laterality: Left;   LIPOMA EXCISION  2009   from back   LIPOMA EXCISION Left 07/18/2012   arm   TUBAL LIGATION     XI ROBOTIC ASSISTED REPAIR OF DIAPHRAGMATIC HERNIA  03/11/2021   Procedure: XI ROBOTIC ASSISTED REPAIR OF DIAPHRAGMATIC HERNIA;  Surgeon: Leafy Ro, MD;  Location: [facility];  Service: General;;    Family History  Problem Relation Age of Onset   Cancer Maternal Grandfather        prostate   Cancer Paternal Grandmother        breast   Breast cancer Paternal Grandmother    Breast cancer Cousin        mat cousin    Allergies  Allergen Reactions   Amoxicillin-Pot Clavulanate Hives   Penicillins Hives    Did it involve swelling of the face/tongue/throat, SOB, or low BP? No Did it involve sudden or severe rash/hives, skin peeling, or any reaction on the inside of your mouth or nose? No Did you need to seek medical attention at a hospital or doctor's office? No When did it last happen? More than 15 years ago If all above answers are "NO", may proceed with cephalosporin use.    Sulfonamide Derivatives Hives    Current Outpatient Medications on File Prior to Visit  Medication Sig Dispense Refill   Multiple Vitamin (MULTIVITAMIN WITH MINERALS) TABS tablet Take 1 tablet by mouth daily.     SKYRIZI 180 MG/1.2ML SOCT Inject into the skin.     Vitamin D3 (VITAMIN D) 25 MCG tablet Take 1,000 Units by mouth daily.     azaTHIOprine (IMURAN) 50 MG tablet Take 150 mg by mouth daily.     ferrous sulfate 325 (65 FE) MG tablet Take 325 mg by mouth daily with breakfast.     No current facility-administered medications on file prior to visit.    BP 132/68   Pulse 76   Temp 98 F (36.7 C) (Temporal)   Ht 5\' 5"  (1.651 m)   Wt 154 lb (69.9 kg)   SpO2 98%   BMI 25.63 kg/m  Objective:   Physical  Exam HENT:     Right Ear: Tympanic membrane and ear canal normal.     Left Ear: Tympanic membrane and ear canal normal.     Mouth/Throat:     Mouth: Mucous membranes are moist.     Pharynx: No postnasal drip.     Comments: 0.5 cm oval shaped mass to right lower posterior throat that appears to be on tonsil. No drainage.  Cardiovascular:  Rate and Rhythm: Normal rate.  Pulmonary:     Effort: Pulmonary effort is normal.  Lymphadenopathy:     Cervical: No cervical adenopathy.  Neurological:     Mental Status: She is alert.           Assessment & Plan:  Tonsillar mass Assessment & Plan: Etiology unclear PE suggestive of tonsillolith, but unclear given location and limited examination.  With patient's consent, unsuccessful attempt to mobilize mass with Qtip applicator. We discussed other techniques at home including repeat gargling, use of water pick or flushing.   Referral to ENT placed for further evaluation/treatment   I evaluated patient, was consulted regarding treatment, and agree with assessment and plan per Chelsea Elkins, MSN, FNP student.   Aneta Bar, NP-C   Orders: -     Ambulatory referral to ENT        Gabriel John, NP

## 2023-12-22 NOTE — Assessment & Plan Note (Addendum)
 Etiology unclear PE suggestive of tonsillolith, but unclear given location and limited examination.  With patient's consent, unsuccessful attempt to mobilize mass with Qtip applicator. We discussed other techniques at home including repeat gargling, use of water pick or flushing.   Referral to ENT placed for further evaluation/treatment   I evaluated patient, was consulted regarding treatment, and agree with assessment and plan per Standley Bargo, MSN, FNP student.   Aneta Bar, NP-C

## 2024-02-28 ENCOUNTER — Other Ambulatory Visit: Payer: Self-pay | Admitting: Primary Care

## 2024-02-28 DIAGNOSIS — Z1231 Encounter for screening mammogram for malignant neoplasm of breast: Secondary | ICD-10-CM

## 2024-03-23 ENCOUNTER — Encounter

## 2024-04-11 ENCOUNTER — Ambulatory Visit (INDEPENDENT_AMBULATORY_CARE_PROVIDER_SITE_OTHER): Admitting: Primary Care

## 2024-04-11 ENCOUNTER — Ambulatory Visit: Payer: Self-pay | Admitting: Primary Care

## 2024-04-11 ENCOUNTER — Ambulatory Visit
Admission: RE | Admit: 2024-04-11 | Discharge: 2024-04-11 | Disposition: A | Discharge status: Home / Self Care | Source: Ambulatory Visit | Attending: Primary Care | Admitting: Primary Care

## 2024-04-11 ENCOUNTER — Encounter: Payer: Self-pay | Admitting: Primary Care

## 2024-04-11 VITALS — BP 132/78 | HR 60 | Temp 97.2°F | Ht 65.0 in | Wt 159.0 lb

## 2024-04-11 DIAGNOSIS — Z0000 Encounter for general adult medical examination without abnormal findings: Secondary | ICD-10-CM | POA: Diagnosis not present

## 2024-04-11 DIAGNOSIS — Z85528 Personal history of other malignant neoplasm of kidney: Secondary | ICD-10-CM

## 2024-04-11 DIAGNOSIS — Z1231 Encounter for screening mammogram for malignant neoplasm of breast: Secondary | ICD-10-CM | POA: Diagnosis present

## 2024-04-11 DIAGNOSIS — K50919 Crohn's disease, unspecified, with unspecified complications: Secondary | ICD-10-CM | POA: Diagnosis not present

## 2024-04-11 DIAGNOSIS — E785 Hyperlipidemia, unspecified: Secondary | ICD-10-CM | POA: Insufficient documentation

## 2024-04-11 LAB — LIPID PANEL
Cholesterol: 234 mg/dL — ABNORMAL HIGH (ref 0–200)
HDL: 91.6 mg/dL (ref >39.00)
LDL Cholesterol: 98 mg/dL (ref 0–99)
NonHDL: 142.35
Total CHOL/HDL Ratio: 3
Triglycerides: 220 mg/dL — ABNORMAL HIGH (ref 0.0–149.0)
VLDL: 44 mg/dL — ABNORMAL HIGH (ref 0.0–40.0)

## 2024-04-11 NOTE — Assessment & Plan Note (Signed)
 Immunizations UTD. Pap smear UTD. Mammogram UTD Colonoscopy UTD, due September 2025  Discussed the importance of a healthy diet and regular exercise in order for weight loss, and to reduce the risk of further co-morbidity.  Exam stable. Labs pending.  Follow up in 1 year for repeat physical.

## 2024-04-11 NOTE — Assessment & Plan Note (Signed)
 In remission.   Reviewed CT abdomen/pelvis from May 2024. Offered to repeat CT scan, she declines and will follow up with urology.

## 2024-04-11 NOTE — Patient Instructions (Signed)
 Stop by the lab prior to leaving today. I will notify you of your results once received.   It was a pleasure to see you today!

## 2024-04-11 NOTE — Progress Notes (Signed)
 Subjective:    Patient ID: Chelsea Hicks, female    DOB: 01-28-1966, 58 y.o.   MRN: [id number]  HPI  Chelsea Hicks is a very pleasant 58 y.o. female who presents today for complete physical and follow up of chronic conditions.  Immunizations: -Tetanus: Completed in 2024  -Shingles: Completed Shingrix  series -Pneumonia: Completed Pneumovax 23 in 2016  Diet: Fair diet.  Exercise: Regular exercise.  Eye exam: Completes annually  Dental exam: Completes semi-annually    Pap Smear: Completed in 2023 Mammogram: Completed today  Colonoscopy: Completed in 2023, due September 2025   Wt Readings from Last 3 Encounters:  04/11/24 159 lb (72.1 kg)  12/22/23 154 lb (69.9 kg)  04/01/23 144 lb (65.3 kg)   BP Readings from Last 3 Encounters:  04/11/24 132/78  12/22/23 132/68  04/01/23 120/66      Review of Systems  Constitutional:  Negative for unexpected weight change.  HENT:  Negative for rhinorrhea.   Respiratory:  Negative for cough and shortness of breath.   Cardiovascular:  Negative for chest pain.  Gastrointestinal:  Negative for constipation and diarrhea.  Genitourinary:  Negative for difficulty urinating.  Musculoskeletal:  Negative for arthralgias and myalgias.  Skin:  Negative for rash.  Allergic/Immunologic: Negative for environmental allergies.  Neurological:  Negative for dizziness and headaches.  Psychiatric/Behavioral:  The patient is not nervous/anxious.          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, including shoulder 2013   Complication of anesthesia 1995   vomiting   Crohn's disease (HCC) 2005   Deficiency anemia 12/14/2007   Annotation: 3/00  Qualifier: Diagnosis of   By: Bartley MD, Lamar Mulch        Diffuse cystic mastopathy 2013   Herpes zoster without complication 03/13/2019   Hypertrophic lichen planus    Lichen simplex chronicus    PONV (postoperative nausea and vomiting)     Regional enteritis of unspecified site    Renal cell carcinoma, left (HCC) 11/30/2019   Special screening for malignant neoplasms, colon     Social History   Socioeconomic History   Marital status: Married    Spouse name: Not on file   Number of children: 2   Years of education: Not on file   Highest education level: Bachelor's degree (e.g., BA, AB, BS)  Occupational History   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[employer]  Tobacco Use   Smoking status: Never   Smokeless tobacco: Never  Vaping Use   Vaping status: Never Used  Substance and Sexual Activity   Alcohol use: Yes    Alcohol/week: 0.0 standard drinks of alcohol    Comment: occasionally   Drug use: No   Sexual activity: Not on file  Other Topics Concern   Not on file  Social History Narrative   Attending [employer] getting Undergraduate in Early Childhood   Social Drivers of Health   Financial Resource Strain: Low Risk  (04/08/2024)   Overall Financial Resource Strain (CARDIA)    Difficulty of Paying Living Expenses: Not hard at all  Food Insecurity: No Food Insecurity (04/08/2024)   Hunger Vital Sign    Worried About Running Out of Food in the Last Year: Never true    Ran Out of Food in the Last Year: Never true  Transportation Needs: No Transportation Needs (04/08/2024)   PRAPARE - Administrator, Civil Service (Medical): No    Lack of Transportation (Non-Medical): No  Physical Activity: Sufficiently Active (04/08/2024)   Exercise Vital Sign    Days of Exercise per Week: 5 days    Minutes of Exercise per Session: 50 min  Stress: No Stress Concern Present (04/08/2024)   [employer] of Occupational Health - Occupational Stress Questionnaire    Feeling of Stress: Not at all  Social Connections: Moderately Integrated (04/08/2024)   Social Connection and Isolation Panel    Frequency of Communication with Friends and Family: Once a week    Frequency of Social Gatherings with Friends and Family: Once a week    Attends  Religious Services: More than 4 times per year    Active Member of Clubs or Organizations: Yes    Attends Banker Meetings: More than 4 times per year    Marital Status: Married  Catering manager Violence: Not on file    Past Surgical History:  Procedure Laterality Date   bowel rescetion  07/2009   BREAST BIOPSY Left 02-11-04   core bx,benign   CHEST TUBE INSERTION  03/11/2021   Procedure: CHEST TUBE INSERTION;  Surgeon: Jordis Laneta FALCON, MD;  Location: [facility];  Service: General;;   CHOLECYSTECTOMY  12/2001   COLONOSCOPY  2011   Dr. Viktoria   DILATION AND CURETTAGE OF UTERUS     hemoglobin electrophinesis     LAPAROSCOPIC NEPHRECTOMY, HAND ASSISTED Left 11/30/2019   Procedure: HAND ASSISTED LAPAROSCOPIC NEPHRECTOMY;  Surgeon: Penne Knee, MD;  Location: [facility];  Service: Urology;  Laterality: Left;   LIPOMA EXCISION  2009   from back   LIPOMA EXCISION Left 07/18/2012   arm   TUBAL LIGATION     XI ROBOTIC ASSISTED REPAIR OF DIAPHRAGMATIC HERNIA  03/11/2021   Procedure: XI ROBOTIC ASSISTED REPAIR OF DIAPHRAGMATIC HERNIA;  Surgeon: Jordis Laneta FALCON, MD;  Location: [facility];  Service: General;;    Family History  Problem Relation Age of Onset   Cancer Maternal Grandfather        prostate   Cancer Paternal Grandmother        breast   Breast cancer Paternal Grandmother    Breast cancer Cousin        mat cousin    Allergies  Allergen Reactions   Amoxicillin-Pot Clavulanate Hives   Penicillins Hives    Did it involve swelling of the face/tongue/throat, SOB, or low BP? No Did it involve sudden or severe rash/hives, skin peeling, or any reaction on the inside of your mouth or nose? No Did you need to seek medical attention at a hospital or doctor's office? No When did it last happen? More than 15 years ago If all above answers are "NO", may proceed with cephalosporin use.    Sulfonamide Derivatives Hives    Current Outpatient Medications on File Prior to Visit   Medication Sig Dispense Refill   Multiple Vitamin (MULTIVITAMIN WITH MINERALS) TABS tablet Take 1 tablet by mouth daily.     SKYRIZI 180 MG/1.2ML SOCT Inject into the skin.     Vitamin D3 (VITAMIN D) 25 MCG tablet Take 1,000 Units by mouth daily.     azaTHIOprine  (IMURAN ) 50 MG tablet Take 150 mg by mouth daily. (Patient not taking: Reported on 04/11/2024)     ferrous sulfate  325 (65 FE) MG tablet Take 325 mg by mouth daily with breakfast. (Patient not taking: Reported on 04/11/2024)     No current facility-administered medications on file prior to visit.    BP 132/78   Pulse 60   Temp (!)  97.2 F (36.2 C) (Temporal)   Ht 5' 5 (1.651 m)   Wt 159 lb (72.1 kg)   SpO2 99%   BMI 26.46 kg/m  Objective:   Physical Exam HENT:     Right Ear: Tympanic membrane and ear canal normal.     Left Ear: Tympanic membrane and ear canal normal.  Eyes:     Pupils: Pupils are equal, round, and reactive to light.  Cardiovascular:     Rate and Rhythm: Normal rate and regular rhythm.  Pulmonary:     Effort: Pulmonary effort is normal.     Breath sounds: Normal breath sounds.  Abdominal:     General: Bowel sounds are normal.     Palpations: Abdomen is soft.     Tenderness: There is no abdominal tenderness.  Musculoskeletal:        General: Normal range of motion.     Cervical back: Neck supple.  Skin:    General: Skin is warm and dry.  Neurological:     Mental Status: She is alert and oriented to person, place, and time.     Cranial Nerves: No cranial nerve deficit.     Deep Tendon Reflexes:     Reflex Scores:      Patellar reflexes are 2+ on the right side and 2+ on the left side. Psychiatric:        Mood and Affect: Mood normal.           Assessment & Plan:  Preventative health care Assessment & Plan: Immunizations UTD. Pap smear UTD. Mammogram UTD Colonoscopy UTD, due September 2025  Discussed the importance of a healthy diet and regular exercise in order for weight loss, and  to reduce the risk of further co-morbidity.  Exam stable. Labs pending.  Follow up in 1 year for repeat physical.    Crohn's disease with complication, unspecified gastrointestinal tract location [facility]) Assessment & Plan: Controlled.   Following with GI, office notes reviewed from March 2025 through Care Everywhere.   Continue Skyrizi injections.  Continue Imuran  150 mg daily.     History of renal cell carcinoma Assessment & Plan: In remission.   Reviewed CT abdomen/pelvis from May 2024. Offered to repeat CT scan, she declines and will follow up with urology.    Hyperlipidemia, unspecified hyperlipidemia type Assessment & Plan: Repeat lipid panel pending.  Orders: -     Lipid panel        Comer MARLA Gaskins, NP

## 2024-04-11 NOTE — Assessment & Plan Note (Signed)
 Repeat lipid panel pending.

## 2024-04-11 NOTE — Assessment & Plan Note (Addendum)
 Controlled.   Following with GI, office notes reviewed from March 2025 through Care Everywhere.   Continue Skyrizi injections.  Continue Imuran  150 mg daily.

## 2024-04-13 ENCOUNTER — Ambulatory Visit: Payer: Self-pay | Admitting: Primary Care

## 2024-05-25 ENCOUNTER — Ambulatory Visit (INDEPENDENT_AMBULATORY_CARE_PROVIDER_SITE_OTHER): Payer: Self-pay

## 2024-05-25 DIAGNOSIS — Z980 Intestinal bypass and anastomosis status: Secondary | ICD-10-CM | POA: Diagnosis not present

## 2024-05-25 DIAGNOSIS — K50812 Crohn's disease of both small and large intestine with intestinal obstruction: Secondary | ICD-10-CM | POA: Diagnosis present

## 2024-05-25 DIAGNOSIS — K640 First degree hemorrhoids: Secondary | ICD-10-CM | POA: Diagnosis not present
